# Patient Record
Sex: Male | Born: 1935 | Race: White | Hispanic: No | Smoking: Never smoker
Health system: Southern US, Community
[De-identification: ages and names within clinical notes are randomized; demographics above are authoritative.]

## PROBLEM LIST (undated history)

## (undated) DIAGNOSIS — C4490 Unspecified malignant neoplasm of skin, unspecified: Secondary | ICD-10-CM

## (undated) DIAGNOSIS — R197 Diarrhea, unspecified: Secondary | ICD-10-CM

## (undated) DIAGNOSIS — C189 Malignant neoplasm of colon, unspecified: Secondary | ICD-10-CM

## (undated) DIAGNOSIS — N400 Enlarged prostate without lower urinary tract symptoms: Secondary | ICD-10-CM

## (undated) DIAGNOSIS — I714 Abdominal aortic aneurysm, without rupture, unspecified: Secondary | ICD-10-CM

## (undated) DIAGNOSIS — B351 Tinea unguium: Secondary | ICD-10-CM

## (undated) DIAGNOSIS — K5790 Diverticulosis of intestine, part unspecified, without perforation or abscess without bleeding: Secondary | ICD-10-CM

## (undated) DIAGNOSIS — K4090 Unilateral inguinal hernia, without obstruction or gangrene, not specified as recurrent: Secondary | ICD-10-CM

## (undated) DIAGNOSIS — K859 Acute pancreatitis without necrosis or infection, unspecified: Secondary | ICD-10-CM

## (undated) DIAGNOSIS — E079 Disorder of thyroid, unspecified: Secondary | ICD-10-CM

## (undated) DIAGNOSIS — E039 Hypothyroidism, unspecified: Secondary | ICD-10-CM

## (undated) DIAGNOSIS — K831 Obstruction of bile duct: Secondary | ICD-10-CM

## (undated) DIAGNOSIS — E876 Hypokalemia: Secondary | ICD-10-CM

## (undated) DIAGNOSIS — M353 Polymyalgia rheumatica: Secondary | ICD-10-CM

## (undated) DIAGNOSIS — M316 Other giant cell arteritis: Secondary | ICD-10-CM

## (undated) DIAGNOSIS — I10 Essential (primary) hypertension: Secondary | ICD-10-CM

## (undated) DIAGNOSIS — M810 Age-related osteoporosis without current pathological fracture: Secondary | ICD-10-CM

## (undated) HISTORY — PX: CHOLECYSTECTOMY: SHX55

## (undated) HISTORY — DX: Unspecified malignant neoplasm of skin, unspecified: C44.90

## (undated) HISTORY — PX: EYE SURGERY: SHX253

---

## 1999-05-29 DIAGNOSIS — C189 Malignant neoplasm of colon, unspecified: Secondary | ICD-10-CM

## 1999-05-29 HISTORY — PX: COLON SURGERY: SHX602

## 1999-05-29 HISTORY — DX: Malignant neoplasm of colon, unspecified: C18.9

## 2000-01-18 ENCOUNTER — Other Ambulatory Visit: Admission: RE | Admit: 2000-01-18 | Discharge: 2000-01-18 | Payer: Self-pay | Admitting: Gastroenterology

## 2000-01-18 ENCOUNTER — Encounter (INDEPENDENT_AMBULATORY_CARE_PROVIDER_SITE_OTHER): Payer: Self-pay | Admitting: Specialist

## 2000-01-23 ENCOUNTER — Encounter: Payer: Self-pay | Admitting: Gastroenterology

## 2000-01-23 ENCOUNTER — Ambulatory Visit (HOSPITAL_COMMUNITY): Admission: RE | Admit: 2000-01-23 | Discharge: 2000-01-23 | Payer: Self-pay | Admitting: Gastroenterology

## 2000-02-09 ENCOUNTER — Inpatient Hospital Stay (HOSPITAL_COMMUNITY): Admission: RE | Admit: 2000-02-09 | Discharge: 2000-02-16 | Payer: Self-pay

## 2000-02-09 ENCOUNTER — Encounter (INDEPENDENT_AMBULATORY_CARE_PROVIDER_SITE_OTHER): Payer: Self-pay

## 2002-03-11 ENCOUNTER — Ambulatory Visit (HOSPITAL_COMMUNITY): Admission: RE | Admit: 2002-03-11 | Discharge: 2002-03-11 | Payer: Self-pay | Admitting: Family Medicine

## 2002-03-11 ENCOUNTER — Encounter: Payer: Self-pay | Admitting: Family Medicine

## 2003-03-19 ENCOUNTER — Ambulatory Visit (HOSPITAL_COMMUNITY): Admission: RE | Admit: 2003-03-19 | Discharge: 2003-03-19 | Payer: Self-pay | Admitting: Family Medicine

## 2003-03-19 ENCOUNTER — Encounter: Payer: Self-pay | Admitting: Family Medicine

## 2004-04-18 ENCOUNTER — Ambulatory Visit: Payer: Self-pay | Admitting: Gastroenterology

## 2004-04-28 ENCOUNTER — Ambulatory Visit: Payer: Self-pay | Admitting: Gastroenterology

## 2008-08-08 ENCOUNTER — Emergency Department (HOSPITAL_COMMUNITY): Admission: EM | Admit: 2008-08-08 | Discharge: 2008-08-08 | Payer: Self-pay | Admitting: Family Medicine

## 2008-10-22 ENCOUNTER — Encounter: Payer: Self-pay | Admitting: Cardiology

## 2008-10-27 ENCOUNTER — Ambulatory Visit: Payer: Self-pay | Admitting: Cardiology

## 2008-10-28 ENCOUNTER — Encounter: Payer: Self-pay | Admitting: Cardiology

## 2010-09-07 LAB — POCT I-STAT, CHEM 8
BUN: 11 mg/dL (ref 6–23)
Calcium, Ion: 1.11 mmol/L — ABNORMAL LOW (ref 1.12–1.32)
Chloride: 103 mEq/L (ref 96–112)
Creatinine, Ser: 1.2 mg/dL (ref 0.4–1.5)
Glucose, Bld: 101 mg/dL — ABNORMAL HIGH (ref 70–99)
HCT: 48 % (ref 39.0–52.0)
Hemoglobin: 16.3 g/dL (ref 13.0–17.0)
Potassium: 3.7 mEq/L (ref 3.5–5.1)
Sodium: 140 mEq/L (ref 135–145)
TCO2: 29 mmol/L (ref 0–100)

## 2010-10-10 NOTE — Assessment & Plan Note (Signed)
Corey Walker                            CARDIOLOGY OFFICE NOTE   Corey Walker, Corey Walker                       MRN:          [id number]  DATE:10/27/2008                            DOB:          01/17/1918    PRIMARY CARE PHYSICIAN:  Helene Kelp, PA   REASON FOR PRESENTATION:  Evaluate the patient with palpitations and  fatigue.   HISTORY OF PRESENT ILLNESS:  The patient is a pleasant 77 year old  gentleman without a prior cardiac history.  He was recently describing  some irregular heartbeat.  He describes that occasionally his heart will  speed up.  He is not doing anything when this happens.  It only lasts  for a few seconds per his report.  He denies any syncope.  He may  occasionally get lightheaded, but this is not necessarily associated  with the palpitations.  He has had no chest discomfort, neck or arm  discomfort.  He has no shortness of breath, PND, or orthopnea.  His  biggest complaint has actually been fatigue.  His brother reports that  he has never recovered following a cataract surgery.  He apparently has  fatigue throughout the day, though he sleeps well at night.  He has also  had some weight loss, though he says his appetite is reasonable.  He has  had an extensive laboratory workup including a normal testosterone  level, normal thyroid studies, and normal CBC.  No etiology for his  fatigue has been identified.   He has had some fluctuating blood pressures.  He has had multiple  changes to his blood pressure medication, but seems to be on a stable  regimen of two drugs with well-controlled blood pressures.  He has some  spiking blood pressures associated with anxiety, and it was thought  possibly this could be related to panic.   PAST MEDICAL HISTORY:  Hypertension less than one year, temporal  arteritis/polymyalgia rheumatica, cataracts, and colon cancer.   ALLERGIES:  PENICILLIN and FISH OIL.   MEDICATIONS:  1. Lisinopril 10  mg daily.  2. Amlodipine 5 mg daily.  3. Proscar 5 mg daily.  4. Calcium.  5. Flomax.  6. Prednisone 5 mg daily.  7. Advair 100/50 b.i.d.  8. Vitamin D.   SOCIAL HISTORY:  The patient is retired.  He is a widow.  He has one  child.  He does not smoke cigarettes or drink alcohol.   FAMILY HISTORY:  Noncontributory for early coronary artery disease.   REVIEW OF SYSTEMS:  As stated in the HPI, and positive for occasional  diarrhea.  Negative for all other systems.   PHYSICAL EXAMINATION:  GENERAL:  The patient is pleasant, and in no  distress.  VITAL SIGNS:  Blood pressure 128/74, heart rate 80 and regular, weight  131 pounds, and body mass index 21.  HEENT:  Eyes:  Unremarkable.  Pupils equal, round, and reactive to  light.  Fundi not visualized.  Oral mucosa unremarkable.  NECK:  No jugular venous distention to 45 degrees.  Carotid upstroke  brisk and symmetrical.  No bruits.  No thyromegaly.  LYMPHATICS:  No cervical, axillary, or inguinal adenopathy.  LUNGS:  Clear to auscultation bilaterally.  BACK:  No costovertebral angle tenderness.  CHEST:  Unremarkable.  HEART:  PMI not displaced or sustained.  S1 and S2 within normal limits.  No S3, no S4, no clicks, no rubs, no murmurs.  ABDOMEN:  Flat.  Positive bowel sounds.  Normal in frequency, pitch.  No  bruits.  No rebound.  No guarding.  No midline pulsatile mass.  No  hepatomegaly or splenomegaly.  SKIN:  No rashes and no nodules.  EXTREMITIES:  A 2+ pulses throughout.  No edema, no cyanosis, and no  clubbing.  NEURO:  Oriented to person, place, and time.  Cranial nerves II through  XII grossly intact.  Motor grossly intact.   EKG; sinus rhythm, rate 79, axis within normal limits, and incomplete  right bundle-branch block.   ASSESSMENT AND PLAN:  1. Palpitations.  This patient has some mild palpitations.  However,      these are short lived.  At this point, I do not think any further      cardiovascular testing of these  would be suggested as they are not      particularly symptomatic.  He has had TSH and electrolytes.      Therefore, I do not suggest further testing.  He will let me know      if these get worse or associated with presyncope or syncope in the      future.  2. Fatigue.  This is clearly the patient's most significant complaint.      He has had a very extensive laboratory workup.  The one thing that      I do not see is a cortisol level.  I will have him come back for an      a.m. cortisol level as adrenal insufficiency given his longstanding      steroid use is a possibility.  3. Hypertension.  His blood pressure has been labile, but seems to be      well controlled.  He is tolerating the medicines as listed.  I will      continue with these.  4. Followup.  I will see the patient again as needed.     Rollene Rotunda, MD, [facility]  Electronically Signed    JH/MedQ  DD: 10/27/2008  DT: 10/28/2008  Job #: [id number]   cc:   Helene Kelp, PA

## 2010-10-13 NOTE — Discharge Summary (Signed)
[place]. [facility]  Patient:    Corey Walker, Corey Walker                       MRN: [id number] Adm. Date:  [id number] Disc. Date: [id number] Attending:  Gennie Alma CC:         Dr. Melvia Heaps, [place], [place]  Dr. Rudi Heap, [place], [place]   Discharge Summary  FINAL DIAGNOSES: 1. 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 2. With a prostatism history, this 77 year old male patient underwent a full    physical examination recently and was found to have Hemoccult-positive    stools.  This prompted a colonoscopy which revealed a mass at the ascending    colon approximately 3 cm in diameter as well as some sigmoid diverticula    and a small sessile polyp in the rectal vault which was resected and was    a tubulovillous adenoma.  The mass of the right colon was biopsied also and    did not show any signs of malignancy.  Despite his age of 77 years, he was    in excellent health except for prostatism.  Physical examination was    completely normal.  HISTORY OF PRESENT ILLNESS:  Patient underwent a bowel prep at home on the day prior to admission and then on the day of admission, February 09, 2000, he underwent a right colectomy.  He did have a significant villous tumor of the ascending colon but only a very small portion of it had a focus of adenocarcinoma with superficial invasion of the wall of the colon, a T1 lesion.  Patient tolerated the procedure well and had an uneventful postoperative course being discharged on seventh postoperative day at which time he was afebrile, ambulating well, eating, his bowels were working, his incision was healing, his skin staples were removed and Steri-Strips applied.  CONDITION ON DISCHARGE:  Satisfactory.  DIET:  Ad lib.  ACTIVITY:  Limited to no strenuous activity.  MEDICATIONS: 1. Vicodin one every six hours p.r.n. for pain. 2. Phenergan 25 mg every six  hours as needed for nausea.  FOLLOW-UP:  He was to return to our office on February 21, 2000 for further follow up. DD:  03/01/00 TD:  03/02/00 Job: [id number]

## 2010-10-13 NOTE — Op Note (Signed)
[place]. [facility]  Patient:    Corey Walker, Corey Walker                       MRN: [id number] Proc. Date: 02/09/00 Adm. Date:  [id number] Attending:  Gennie Alma                           Operative Report  [facility] #:  [phone]  PREOPERATIVE DIAGNOSIS:  Villous tumor of ascending colon.  POSTOPERATIVE DIAGNOSIS:  Villous tumor of ascending colon.  OPERATION:  Right colectomy.  SURGEON:  Dr. Milus Mallick.  ASSISTANT:  Dr. Maisie Fus B. Price.  ANESTHESIA:  General endotracheal anesthesia.  DESCRIPTION OF PROCEDURE:  Under adequate general endotracheal anesthesia, the patients abdomen was prepared and draped in the usual fashion.  A 16 French Foley catheter was inserted into the urinary bladder under sterile conditions. A midline incision was made from the mid epigastrium to the mid hypogastrium, going around the left side of the umbilicus; bleeders were electrocoagulated. Upon entering the peritoneal cavity, there were no abnormalities noted in the peritoneal lining.  The only finding was a significantly large intraluminal tumor of the ascending colon that had the size of a golf ball.  The small bowel was completely normal, as was the stomach and duodenum.  The liver was normal to palpation.  A Balfour self-retaining retractor was inserted.  The right colon was approached.  The peritoneal reflection in the right colon was incised with Bovie electrocoagulation and the right colon was reflected medially and rotated anteriorly and dissected up off of the retroperitoneal structures. The hepatic flexure attachments were divided over large ligaclips and the gastrocolic omentum was also divided over ligaclips.  This freed up the right colon completely.  There were some adhesions of the terminal ileum binding it down in the right lower quadrant and these also were divided, freeing up the entire colon.  The omentum at the mid portion of the  transverse colon was divided over Kelly clamps and suture ligated with 2-0 silk.  Next, the right colon was resected in this manner.  The transverse mesocolon was serially divided over Kelly clamps which were ligated with 2-0 silk, starting from the middle colic vessels and going proximally.  The ileocolic artery was triply clipped with Kelly clamps and divided between the distal two clamps, ligated with 2-0 silk and suture ligated with 2-0 silk.  The mesentery of the terminal ileum was divided over Kelly clamps which were ligated with 2-0 silk, and next, the terminal ileum was divided with the GIA 75 mm stapler approximately 10 cm proximal to the ileocecal valve.  The transverse colon was then divided at the mid portion using the GIA stapler, as well.  A side-to-side, functional end-to-end anastomosis was carried out with the GIA 75 mm at Ethicon stapler. The entire staple line was without any bleeding.  The antimesenteric ends of the previous staple lines were cut off in order to insert the stapler, and then the common defect was closed with a single firing of the TA60 Ethicon linear stapler.  This gave a very patent anastomosis that was quite secure.  A crotch stitch was placed with 3-0 silk.  All of the instruments used for the anastomosis were then removed from the operative field, as well as the operative personnel changing gloves.  The defect in the mesentery was closed with interrupted sutures of 3-0 silk. Hemostasis was  ascertained.  The midline fascia was closed with continuous suture of #1 Prolene and the skin was approximated with a generic skin stapler 35-W.  Sterile dressing was applied.  Estimated blood loss for the procedure was negligible.  The patient tolerated the procedure well, left the operating room in satisfactory condition. DD:  02/09/00 TD:  02/10/00 Job: [id number]

## 2011-07-12 ENCOUNTER — Inpatient Hospital Stay (HOSPITAL_COMMUNITY)
Admission: AD | Admit: 2011-07-12 | Discharge: 2011-07-15 | DRG: 440 | Disposition: A | Discharge status: Home / Self Care | Payer: Medicare Other | Source: Other Acute Inpatient Hospital | Attending: Internal Medicine | Admitting: Internal Medicine

## 2011-07-12 ENCOUNTER — Encounter (HOSPITAL_COMMUNITY): Payer: Self-pay | Admitting: Internal Medicine

## 2011-07-12 DIAGNOSIS — K831 Obstruction of bile duct: Secondary | ICD-10-CM | POA: Diagnosis present

## 2011-07-12 DIAGNOSIS — K838 Other specified diseases of biliary tract: Secondary | ICD-10-CM | POA: Diagnosis present

## 2011-07-12 DIAGNOSIS — Z85038 Personal history of other malignant neoplasm of large intestine: Secondary | ICD-10-CM | POA: Diagnosis present

## 2011-07-12 DIAGNOSIS — E039 Hypothyroidism, unspecified: Secondary | ICD-10-CM | POA: Diagnosis present

## 2011-07-12 DIAGNOSIS — K859 Acute pancreatitis without necrosis or infection, unspecified: Principal | ICD-10-CM | POA: Diagnosis present

## 2011-07-12 DIAGNOSIS — I714 Abdominal aortic aneurysm, without rupture, unspecified: Secondary | ICD-10-CM | POA: Diagnosis present

## 2011-07-12 DIAGNOSIS — K4090 Unilateral inguinal hernia, without obstruction or gangrene, not specified as recurrent: Secondary | ICD-10-CM | POA: Diagnosis present

## 2011-07-12 DIAGNOSIS — M353 Polymyalgia rheumatica: Secondary | ICD-10-CM | POA: Diagnosis present

## 2011-07-12 DIAGNOSIS — I10 Essential (primary) hypertension: Secondary | ICD-10-CM | POA: Diagnosis present

## 2011-07-12 HISTORY — DX: Polymyalgia rheumatica: M35.3

## 2011-07-12 HISTORY — DX: Malignant neoplasm of colon, unspecified: C18.9

## 2011-07-12 HISTORY — DX: Essential (primary) hypertension: I10

## 2011-07-12 LAB — BASIC METABOLIC PANEL
BUN: 20 mg/dL (ref 6–23)
Calcium: 8.8 mg/dL (ref 8.4–10.5)
Creatinine, Ser: 0.87 mg/dL (ref 0.50–1.35)
GFR calc Af Amer: 85 mL/min — ABNORMAL LOW (ref >90)
GFR calc non Af Amer: 73 mL/min — ABNORMAL LOW (ref >90)
Glucose, Bld: 98 mg/dL (ref 70–99)

## 2011-07-12 LAB — HEPATIC FUNCTION PANEL
Bilirubin, Direct: 1.6 mg/dL — ABNORMAL HIGH (ref 0.0–0.3)
Indirect Bilirubin: 1.4 mg/dL — ABNORMAL HIGH (ref 0.3–0.9)
Total Bilirubin: 3 mg/dL — ABNORMAL HIGH (ref 0.3–1.2)

## 2011-07-12 LAB — CBC
MCH: 32.6 pg (ref 26.0–34.0)
MCHC: 33.8 g/dL (ref 30.0–36.0)
Platelets: 113 10*3/uL — ABNORMAL LOW (ref 150–400)
RBC: 4.33 MIL/uL (ref 4.22–5.81)
RDW: 15 % (ref 11.5–15.5)

## 2011-07-12 LAB — PROTIME-INR: Prothrombin Time: 13.5 seconds (ref 11.6–15.2)

## 2011-07-12 LAB — GLUCOSE, CAPILLARY: Glucose-Capillary: 94 mg/dL (ref 70–99)

## 2011-07-12 MED ORDER — PANTOPRAZOLE SODIUM 40 MG IV SOLR
40.0000 mg | Freq: Two times a day (BID) | INTRAVENOUS | Status: DC
  Administered 2011-07-12 – 2011-07-15 (×6): 40 mg via INTRAVENOUS
  Filled 2011-07-12 (×7): qty 40

## 2011-07-12 MED ORDER — SODIUM CHLORIDE 0.9 % IV SOLN
INTRAVENOUS | Status: DC
  Administered 2011-07-12: 22:00:00 via INTRAVENOUS

## 2011-07-12 MED ORDER — ONDANSETRON HCL 4 MG PO TABS: 4.0000 mg | ORAL_TABLET | Freq: Four times a day (QID) | ORAL | Status: DC | PRN

## 2011-07-12 MED ORDER — METHYLPREDNISOLONE SODIUM SUCC 40 MG IJ SOLR
20.0000 mg | Freq: Every day | INTRAMUSCULAR | Status: DC
  Administered 2011-07-12 – 2011-07-14 (×3): 20 mg via INTRAVENOUS
  Filled 2011-07-12 (×3): qty 0.5

## 2011-07-12 MED ORDER — IOHEXOL 300 MG/ML  SOLN
20.0000 mL | INTRAMUSCULAR | Status: AC
  Administered 2011-07-12 (×2): 20 mL via ORAL

## 2011-07-12 MED ORDER — ONDANSETRON HCL 4 MG/2ML IJ SOLN: 4.0000 mg | Freq: Four times a day (QID) | INTRAMUSCULAR | Status: DC | PRN

## 2011-07-12 MED ORDER — MORPHINE SULFATE 2 MG/ML IJ SOLN: 0.5000 mg | INTRAMUSCULAR | Status: DC | PRN

## 2011-07-12 NOTE — H&P (Signed)
Corey Walker is an 78 y.o. male.   PCP - Helene Kelp PA/ Dr.Moore. Chief Complaint: Transferred from [facility] for further workup on obstructive jaundice. HPI: 78 year-old male with history of hypertension polymyalgia rheumatica history of colon cancer status  post resection and left inguinal hernia had gone to [facility] because of abdominal pain. Patient had initially chest pain after which each he started experiencing abdominal pain and [facility] was found to have pancreatitis with obstructive jaundice. His total bilirubin on admission was found to be around 5.4 and repeat done today was found to be around 6.3 direct bilirubin was 4.2 sonogram of the abdomen today showed a dilated CBD along with abdominal aortic aneurysm patient is post cholecystectomy. Patient was transferred to [facility] for further management as there was no gastroenterologist available at that facility. Patient states still he has mild abdominal discomfort which increases on eating. Patient denies any chest pain at this time, denies any cough, shortness of breath nausea vomiting or diarrhea. Denies any fever chills. Presently he is not in any acute distress. His cardiac enzymes at [facility] was negative. His LFTs done on July 11, 2011 where showing total bilirubin 6.3 direct bilirubin 4.2 amylase 213 lipase 69 heart phosphatase 109 AST 172 ALT 213. His PT/INR was 12.7 and 0.9 UA was negative for nitrites and leukocyte Estrace. Basic metabolic panel showed glucose of 177 BUN of 25 creatinine of 0.8 calcium 8.7 sodium 138 potassium 3.7 chloride 105 products a 22 his cardiac enzymes total CPK was 29 CPK MB 3.4 relative index 11.7 troponin I 0.03. CBC showed a WBC of 10.2 hemoglobin 14.5 hematocrit 42.8 platelets 119. Sonogram of the abdomen done today was read as post cholecystectomy with dilated CBD 11 mm diameter with associated intrahepatic biliary dilatation large left renal cyst abdominal  aortic aneurysm measures 4.1 into 3.6 cm in axial dimensions and extending at least 5.9 cm in length inadequate visualization of pancreatic head and distal CBD.  Past Medical History  Diagnosis Date  . Hypertension   . Polymyalgia rheumatica   . Colon cancer     Past Surgical History  Procedure Date  . Colon surgery     History reviewed. No pertinent family history. Social History:  reports that he has never smoked. He does not have any smokeless tobacco history on file. He reports that he does not drink alcohol or use illicit drugs.  Allergies:  Allergies  Allergen Reactions  . Penicillins Swelling    Swelling of hands (per [facility] records)    No current facility-administered medications on file as of 07/12/2011.   No current outpatient prescriptions on file as of 07/12/2011.    No results found for this or any previous visit (from the past 48 hour(s)). No results found.  Review of Systems  Constitutional: Negative.   HENT: Negative.   Eyes: Negative.   Respiratory: Negative.   Cardiovascular: Negative.   Gastrointestinal: Positive for abdominal pain.  Genitourinary: Negative.   Musculoskeletal: Negative.   Skin: Negative.   Neurological: Negative.   Endo/Heme/Allergies: Negative.   Psychiatric/Behavioral: Negative.     Blood pressure 119/73, pulse 79, temperature 98.1 F (36.7 C), temperature source Oral, resp. rate 16, SpO2 100.00%. Physical Exam  Constitutional: He is oriented to person, place, and time. He appears well-developed and well-nourished. No distress.  HENT:  Head: Normocephalic and atraumatic.  Right Ear: External ear normal.  Left Ear: External ear normal.  Nose: Nose normal.  Mouth/Throat: Oropharynx is  clear and moist. No oropharyngeal exudate.  Eyes: Scleral icterus is present.  Neck: Normal range of motion. Neck supple.  Cardiovascular: Normal rate, regular rhythm and normal heart sounds.   Respiratory: Effort normal and breath sounds  normal. No respiratory distress. He has no wheezes. He has no rales.  GI: Soft. Bowel sounds are normal. He exhibits no distension. There is tenderness. There is no rebound and no guarding.       Diffuse tenderness but more in the right upper and lower quadrant. Left inguinal hernia.  Musculoskeletal: Normal range of motion. He exhibits no edema and no tenderness.  Neurological: He is alert and oriented to person, place, and time.       Moves upper and lower extremities.  Skin: Skin is warm and dry. No rash noted. He is not diaphoretic. No erythema.  Psychiatric: His behavior is normal.     Assessment/Plan #1. Acute pancreatitis with obstructive jaundice - at this time I have discussed with Dr. Jeani Hawking gastroenterologist on call. We will be ordering a CAT scan abdomen and pelvis with contrast to rule out any pancreatic mass. Patient will be kept n.p.o. for possible ERCP. Recheck his LFTs and basic labs. #2. History of hypertension - as patient is n.p.o. patient will be kept on IV labetalol when necessary for systolic blood pressure more than 160. #3. History of polymyalgia rheumatica on steroids - we'll keep patient on small dose of IV Solu-Medrol until patient can take by mouth. #4. Abdominal aortic aneurysm - we'll check CT abdomen and also will need follow as outpatient if CT abdomen does not show anything acute. #5. History of CAcolon status post resection. #6. Left inguinal hernia not obstructed.  CODE STATUS - full code.  Eduard Clos 07/12/2011, 8:32 PM

## 2011-07-13 ENCOUNTER — Inpatient Hospital Stay (HOSPITAL_COMMUNITY): Payer: Medicare Other

## 2011-07-13 ENCOUNTER — Encounter (HOSPITAL_COMMUNITY)
Admission: AD | Disposition: A | Discharge status: Home / Self Care | Payer: Self-pay | Source: Other Acute Inpatient Hospital | Attending: Internal Medicine

## 2011-07-13 ENCOUNTER — Encounter (HOSPITAL_COMMUNITY): Payer: Self-pay

## 2011-07-13 HISTORY — PX: EUS: SHX5427

## 2011-07-13 LAB — CBC
MCH: 31.7 pg (ref 26.0–34.0)
MCV: 97.6 fL (ref 78.0–100.0)
Platelets: 96 10*3/uL — ABNORMAL LOW (ref 150–400)
RBC: 4.13 MIL/uL — ABNORMAL LOW (ref 4.22–5.81)
RDW: 15 % (ref 11.5–15.5)
WBC: 8.2 10*3/uL (ref 4.0–10.5)

## 2011-07-13 LAB — BASIC METABOLIC PANEL
BUN: 22 mg/dL (ref 6–23)
CO2: 21 mEq/L (ref 19–32)
Calcium: 8.4 mg/dL (ref 8.4–10.5)
GFR calc non Af Amer: 77 mL/min — ABNORMAL LOW (ref >90)
Glucose, Bld: 94 mg/dL (ref 70–99)

## 2011-07-13 LAB — TSH: TSH: 0.495 u[IU]/mL (ref 0.350–4.500)

## 2011-07-13 LAB — HEPATIC FUNCTION PANEL
AST: 83 U/L — ABNORMAL HIGH (ref 0–37)
Albumin: 2.1 g/dL — ABNORMAL LOW (ref 3.5–5.2)
Alkaline Phosphatase: 124 U/L — ABNORMAL HIGH (ref 39–117)
Bilirubin, Direct: 0.8 mg/dL — ABNORMAL HIGH (ref 0.0–0.3)
Total Bilirubin: 1.7 mg/dL — ABNORMAL HIGH (ref 0.3–1.2)

## 2011-07-13 LAB — CARDIAC PANEL(CRET KIN+CKTOT+MB+TROPI)
CK, MB: 3.2 ng/mL (ref 0.3–4.0)
Total CK: 43 U/L (ref 7–232)

## 2011-07-13 LAB — GLUCOSE, CAPILLARY
Glucose-Capillary: 81 mg/dL (ref 70–99)
Glucose-Capillary: 112 mg/dL — ABNORMAL HIGH (ref 70–99)
Glucose-Capillary: 132 mg/dL — ABNORMAL HIGH (ref 70–99)

## 2011-07-13 SURGERY — UPPER ENDOSCOPIC ULTRASOUND (EUS) LINEAR
Anesthesia: Moderate Sedation

## 2011-07-13 MED ORDER — MIDAZOLAM HCL 10 MG/2ML IJ SOLN
INTRAMUSCULAR | Status: AC
  Filled 2011-07-13: qty 4

## 2011-07-13 MED ORDER — FUROSEMIDE 20 MG PO TABS
20.0000 mg | ORAL_TABLET | Freq: Every day | ORAL | Status: DC
  Administered 2011-07-13 – 2011-07-15 (×3): 20 mg via ORAL
  Filled 2011-07-13 (×3): qty 1

## 2011-07-13 MED ORDER — FENTANYL CITRATE 0.05 MG/ML IJ SOLN
INTRAMUSCULAR | Status: DC | PRN
  Administered 2011-07-13: 12.5 ug via INTRAVENOUS
  Administered 2011-07-13: 25 ug via INTRAVENOUS
  Administered 2011-07-13 (×3): 12.5 ug via INTRAVENOUS

## 2011-07-13 MED ORDER — FLUTICASONE PROPIONATE HFA 44 MCG/ACT IN AERO
2.0000 | INHALATION_SPRAY | Freq: Two times a day (BID) | RESPIRATORY_TRACT | Status: DC
  Administered 2011-07-13 – 2011-07-15 (×4): 2 via RESPIRATORY_TRACT
  Filled 2011-07-13: qty 10.6

## 2011-07-13 MED ORDER — DEXTROSE-NACL 5-0.9 % IV SOLN
INTRAVENOUS | Status: DC
  Administered 2011-07-13 – 2011-07-14 (×2): via INTRAVENOUS

## 2011-07-13 MED ORDER — CHOLECALCIFEROL 10 MCG (400 UNIT) PO TABS
400.0000 [IU] | ORAL_TABLET | Freq: Two times a day (BID) | ORAL | Status: DC
  Administered 2011-07-13 – 2011-07-15 (×4): 400 [IU] via ORAL
  Filled 2011-07-13 (×5): qty 1

## 2011-07-13 MED ORDER — IOHEXOL 300 MG/ML  SOLN
100.0000 mL | Freq: Once | INTRAMUSCULAR | Status: AC | PRN
  Administered 2011-07-13: 100 mL via INTRAVENOUS

## 2011-07-13 MED ORDER — PREDNISONE 5 MG PO TABS
5.0000 mg | ORAL_TABLET | Freq: Two times a day (BID) | ORAL | Status: DC
  Administered 2011-07-13 – 2011-07-15 (×4): 5 mg via ORAL
  Filled 2011-07-13 (×6): qty 1

## 2011-07-13 MED ORDER — HYDRALAZINE HCL 20 MG/ML IJ SOLN
10.0000 mg | Freq: Four times a day (QID) | INTRAMUSCULAR | Status: DC | PRN
  Filled 2011-07-13: qty 0.5

## 2011-07-13 MED ORDER — NALOXONE HCL 0.4 MG/ML IJ SOLN
INTRAMUSCULAR | Status: AC
  Filled 2011-07-13: qty 1

## 2011-07-13 MED ORDER — CYANOCOBALAMIN 1000 MCG/ML IJ SOLN
1000.0000 ug | INTRAMUSCULAR | Status: DC
  Administered 2011-07-14: 1000 ug via INTRAMUSCULAR
  Filled 2011-07-13 (×2): qty 1

## 2011-07-13 MED ORDER — SODIUM CHLORIDE 0.9 % IV SOLN: Freq: Once | INTRAVENOUS | Status: DC

## 2011-07-13 MED ORDER — MIDAZOLAM HCL 10 MG/2ML IJ SOLN
INTRAMUSCULAR | Status: DC | PRN
  Administered 2011-07-13: 2 mg via INTRAVENOUS
  Administered 2011-07-13: 1.5 mg via INTRAVENOUS
  Administered 2011-07-13 (×2): 1 mg via INTRAVENOUS
  Administered 2011-07-13: 2 mg via INTRAVENOUS

## 2011-07-13 MED ORDER — FERROUS FUMARATE 325 (106 FE) MG PO TABS
1.0000 | ORAL_TABLET | Freq: Every day | ORAL | Status: DC
  Administered 2011-07-13 – 2011-07-15 (×3): 106 mg via ORAL
  Filled 2011-07-13 (×3): qty 1

## 2011-07-13 MED ORDER — FENTANYL CITRATE 0.05 MG/ML IJ SOLN
INTRAMUSCULAR | Status: AC
  Filled 2011-07-13: qty 4

## 2011-07-13 MED ORDER — FLUMAZENIL 0.5 MG/5ML IV SOLN
INTRAVENOUS | Status: AC
  Filled 2011-07-13: qty 5

## 2011-07-13 NOTE — Progress Notes (Signed)
Assessed pts steward scale = 3.  Recovery RN, W. Herndon-Myers informed that pt not responsive to verbal stimulation.  VSS except HR was SB - 47-52.  Pt not cyanotic.  CBG = 115.  Performed light sternal rub w/o response.  1622 reversed pt with Romazicon 0.3mg  IV x1 and Narcan 0.2mg  IV x1.  1626 Pt still not responsive to verbal stimulation.  1627 Pt given Romazicon 0.2mg  IV x1 and Narcan 0.2mg  IV x1.  1637 Pt begins to respond to verbal stimuli, steward sedation scale = 5.  Bradly Chris, RN continues with recovery of pt.

## 2011-07-13 NOTE — Interval H&P Note (Signed)
History and Physical Interval Note:  07/13/2011 2:23 PM  Corey Walker  has presented today for surgery, with the diagnosis of Pancreatitis and abnormal liver enzymes  The various methods of treatment have been discussed with the patient and family. After consideration of risks, benefits and other options for treatment, the patient has consented to  Procedure(s) (LRB): UPPER ENDOSCOPIC ULTRASOUND (EUS) LINEAR (N/A) as a surgical intervention .  The patients' history has been reviewed, patient examined, no change in status, stable for surgery.  I have reviewed the patients' chart and labs.  Questions were answered to the patient's satisfaction.     Mozes Sagar D

## 2011-07-13 NOTE — Progress Notes (Signed)
Utilization review completed.  

## 2011-07-13 NOTE — Progress Notes (Signed)
INITIAL ADULT NUTRITION ASSESSMENT Date: 07/13/2011   Time: 9:57 AM Reason for Assessment: thin/NPO  ASSESSMENT: Male 78 y.o.  Dx: ? Gallstone  Pancreatitis vs symptomatic cholelithiasis, large left inguinal hernia  Hx:  Past Medical History  Diagnosis Date  . Hypertension   . Polymyalgia rheumatica   . Colon cancer    Past Surgical History  Procedure Date  . Colon surgery     Related Meds: solumedrol, protonix  Ht: 5\' 7"  (170.2 cm)  Wt: 122 lb 12.7 oz (55.7 kg)  Ideal Wt: 148# (67kg) % Ideal Wt: 83  Usual Wt: 120# for the past 2 years % Usual Wt: 102  Body mass index is 19.23 kg/(m^2).  Food/Nutrition Related Hx: Regular diet.  "Eats like a Horse" but unable to gain weight per pt.  Son confirms history.  Labs:  CMP     Component Value Date/Time   NA 137 07/13/2011 0530   K 3.6 07/13/2011 0530   CL 106 07/13/2011 0530   CO2 21 07/13/2011 0530   GLUCOSE 94 07/13/2011 0530   BUN 22 07/13/2011 0530   CREATININE 0.78 07/13/2011 0530   CALCIUM 8.4 07/13/2011 0530   PROT 5.0* 07/13/2011 0530   ALBUMIN 2.1* 07/13/2011 0530   AST 83* 07/13/2011 0530   ALT 239* 07/13/2011 0530   ALKPHOS 124* 07/13/2011 0530   BILITOT 1.7* 07/13/2011 0530   GFRNONAA 77* 07/13/2011 0530   GFRAA 89* 07/13/2011 0530    I/O last 3 completed shifts: In: -  Out: 750 [Urine:750]     Diet Order: NPO  Supplements/Tube Feeding:none  IVF:    sodium chloride Last Rate: 75 mL/hr at 07/12/11 2130    Estimated Nutritional Needs:per day   Kcal: 1300-1400 Protein: 65-75g Fluid: >1.3L  Pt transferred from [facility] and has been NPO x2 days.  Pt reports no pain currently and feels good.  Awaiting tests.  At risk for malnutrition secondary to low weight and current NPO status.  NUTRITION DIAGNOSIS: -Inadequate oral intake (NI-2.1).  Status: Ongoing  RELATED TO: altered GI function  AS EVIDENCE BY: NPO status  MONITORING/EVALUATION(Goals): Tolerate diet advancement to  solids  EDUCATION NEEDS: -No education needs identified at this time  INTERVENTION: 1.  NPO diet to be advanced as appropriate by MD 2.  Monitor diet status, weight, labs.  Dietitian [phone]  DOCUMENTATION CODES Per approved criteria  -Not Applicable    Jeoffrey Massed 07/13/2011, 9:57 AM

## 2011-07-13 NOTE — H&P (View-Only) (Signed)
Subjective: Patient seen and examined this morning. Informs is abdominal pain to have resolved.  Objective:  Vital signs in last 24 hours:  Filed Vitals:   07/12/11 1909 07/12/11 2135 07/13/11 0552  BP: 119/73 113/60 149/80  Pulse: 79 61 50  Temp: 98.1 F (36.7 C) 98.1 F (36.7 C) 97.2 F (36.2 C)  TempSrc: Oral Oral Oral  Resp: 16 18 16   Height:   5\' 7"  (1.702 m)  Weight:   55.7 kg (122 lb 12.7 oz)  SpO2: 100% 99% 100%    Intake/Output from previous day:   Intake/Output Summary (Last 24 hours) at 07/13/11 1034 Last data filed at 07/13/11 0030  Gross per 24 hour  Intake      0 ml  Output    750 ml  Net   -750 ml    Physical Exam:  General: elderly in no acute distress. HEENT: no pallor, icteric , moist oral mucosa, no JVD, no lymphadenopathy Heart: Normal  s1 &s2  Regular rate and rhythm, without murmurs, rubs, gallops. Lungs: Clear to auscultation bilaterally. Abdomen: Soft, nontender, nondistended, positive bowel sounds. Left inguinal hernia+ Extremities: No clubbing cyanosis or edema with positive pedal pulses. Neuro: Alert, awake, oriented x3, nonfocal.   Lab Results:  Basic Metabolic Panel:    Component Value Date/Time   NA 137 07/13/2011 0530   K 3.6 07/13/2011 0530   CL 106 07/13/2011 0530   CO2 21 07/13/2011 0530   BUN 22 07/13/2011 0530   CREATININE 0.78 07/13/2011 0530   GLUCOSE 94 07/13/2011 0530   CALCIUM 8.4 07/13/2011 0530   CBC:    Component Value Date/Time   WBC 8.2 07/13/2011 0530   HGB 13.1 07/13/2011 0530   HCT 40.3 07/13/2011 0530   PLT 96* 07/13/2011 0530   MCV 97.6 07/13/2011 0530    No results found for this or any previous visit (from the past 240 hour(s)).  Studies/Results: Ct Abdomen Pelvis W Contrast  07/13/2011  *RADIOLOGY REPORT*  Clinical Data: Abdominal pain.  History of pancreatitis.  CT ABDOMEN AND PELVIS WITH CONTRAST  Technique:  Multidetector CT imaging of the abdomen and pelvis was performed following the standard  protocol during bolus administration of intravenous contrast.  Contrast: OMNIPAQUE IOHEXOL 300 MG/ML IV SOLN  Comparison: 01/26/2011  Findings: Dependent atelectasis in the lung bases.  Small esophageal hiatal hernia.  Surgical absence of the gallbladder.  Mild intra and extrahepatic bile duct dilatation.  This is likely physiologic postcholecystectomy.  No discrete mass or stone is visualized.  The pancreas demonstrates diffuse fatty atrophy.  No discrete pancreatic mass is visualized.  The appearance is similar to the previous study.  No inflammatory stranding or fluid collections demonstrated around the pancreas.  No focal liver lesions.  Spleen size is normal.  No adrenal gland nodules.  Large cyst in the midportion of the left kidney measuring 7.5 cm diameter, stable since previous study.  No solid mass or pyelocaliectasis demonstrated in the kidneys.  Small sub centimeter cysts within the renal parenchyma bilaterally.  The stomach and small bowel are not distended.  Contrast material flows to the sigmoid colon suggesting no evidence of obstruction.  No free air or free fluid in the abdomen.  Abdominal aortic aneurysm with peripheral calcification and mural thrombus.  The aneurysm measures about 4.2 cm diameter. Previous measurement is 3.9 cm.  This is likely within margin of error of measurement.  Pelvis:  There is a large left inguinal hernia containing bowel loops.  No  proximal obstruction.  This is stable. Diverticulosis of the sigmoid colon without inflammatory change.  Bladder wall is not thickened.  Prostate gland is moderately enlarged. No free or loculated pelvic fluid collections.  The appendix is normal. Generative changes in the lumbar spine.  No significant change since previous study.  IMPRESSION: Post cholecystectomy with likely physiologic bile duct dilatation. No pancreatic ductal dilatation or inflammatory changes demonstrated.  Diffuse pancreatic fatty atrophy.  4.2 cm diameter  abdominal aortic aneurysm appears stable.  Large left inguinal hernia containing multiple small bowel loops but without proximal obstruction.  Diverticulosis without diverticulitis in the sigmoid colon.  Original Report Authenticated By: Marlon Pel, M.D.    Medications: Scheduled Meds:   . iohexol  20 mL Oral Q1 Hr x 2  . methylPREDNISolone (SOLU-MEDROL) injection  20 mg Intravenous Daily  . pantoprazole (PROTONIX) IV  40 mg Intravenous Q12H   Continuous Infusions:   . sodium chloride 75 mL/hr at 07/12/11 2130   PRN Meds:.iohexol, morphine, ondansetron (ZOFRAN) IV, ondansetron  Assessment/Plan: 78 y/o male with hx of PMR, HTN, hx of colon ca transferred from [facility] hospital where he presented with abdominal pain and found to have transaminitis with dilated CBD on imaging. ( has hx of cholecystectomy). Patient transferred here for GI eval.    acute pancreatitis with obstructive  jaundice Unclear if this is related to gallstone. CT abdomen does not show any stone and no pancreatic duct dilatation . his pain now resolved. Possible to have passed the stone. transaminaitis slowly improving as well  seen by GI ( hung) with plan on EUS today. Follow up with repeat labs  HTN  on IV hydralazine prn as pt NPO  Hx of PMR  on chr steroids Placed on IV solumedrol as he's NPO  AAA CT abd done shows size of 4.2 , slight increase from 3.9 5 months back   Full code  LOS: 1 day   Darreld Hoffer 07/13/2011, 10:34 AM

## 2011-07-13 NOTE — Op Note (Signed)
[facility] [address] [place], [place]  [address]  ENDOSCOPIC ULTRASOUND PROCEDURE REPORT  PATIENT:  Corey Walker, Corey Walker  MR#:  [id number] BIRTHDATE:  1918/01/14  GENDER:  male ENDOSCOPIST:  Jeani Hawking, MD PROCEDURE DATE:  07/13/2011 PROCEDURE:  Upper EUS ASA CLASS:  Class II INDICATIONS:  ? Choledocholithiasis MEDICATIONS:   Fentanyl 75 mcg IV, Versed 7.5 mg IV  DESCRIPTION OF PROCEDURE:   After the risks benefits and alternatives of the procedure were  explained, informed consent was obtained. The patient was then placed in the left, lateral, decubitus postion and IV sedation was administered. Throughout the procedure, the patient's blood pressure, pulse and oxygen saturations were monitored continuously.  Under direct visualization, the  endoscope was introduced through the mouth and advanced to the first portion of the duodenum.  Water was used as necessary to provide an acoustic interface.  Upon completion of the imaging, water was removed and the patient was sent to the recovery room in satisfactory condition. <<PROCEDUREIMAGES>>  FINDINGS:   The procedure was difficult to perform as he was combative and obtaining an accurate Pox reading was difficult. The saturation values ranged from 82% to 100% during the procedure. A clear view of the entire CBD was obtained. The duct was noted to be mildly dilated at 9 mm, but there was no evidence of any retained stones, sludge, or masses. The pancreatic parenchyma appeared to be normal, but a complete examination was curtailed secondary to the above mentioned issues.    The scope was then withdrawn from the patient and the procedure completed.  COMPLICATIONS:  None  ENDOSCOPIC IMPRESSION: 1) Midly dilated CBD. RECOMMENDATIONS: 1) Supportive care.  No further GI intervention at this time. Call with any questions.  ______________________________ Jeani Hawking, MD  n. Rosalie DoctorJeani Hawking at 07/13/2011 03:18  PM  Corey Walker, [id number]

## 2011-07-13 NOTE — Progress Notes (Signed)
Subjective: Patient seen and examined this morning. Informs is abdominal pain to have resolved.  Objective:  Vital signs in last 24 hours:  Filed Vitals:   07/12/11 1909 07/12/11 2135 07/13/11 0552  BP: 119/73 113/60 149/80  Pulse: 79 61 50  Temp: 98.1 F (36.7 C) 98.1 F (36.7 C) 97.2 F (36.2 C)  TempSrc: Oral Oral Oral  Resp: 16 18 16  Height:   5' 7" (1.702 m)  Weight:   55.7 kg (122 lb 12.7 oz)  SpO2: 100% 99% 100%    Intake/Output from previous day:   Intake/Output Summary (Last 24 hours) at 07/13/11 1034 Last data filed at 07/13/11 0030  Gross per 24 hour  Intake      0 ml  Output    750 ml  Net   -750 ml    Physical Exam:  General: elderly in no acute distress. HEENT: no pallor, icteric , moist oral mucosa, no JVD, no lymphadenopathy Heart: Normal  s1 &s2  Regular rate and rhythm, without murmurs, rubs, gallops. Lungs: Clear to auscultation bilaterally. Abdomen: Soft, nontender, nondistended, positive bowel sounds. Left inguinal hernia+ Extremities: No clubbing cyanosis or edema with positive pedal pulses. Neuro: Alert, awake, oriented x3, nonfocal.   Lab Results:  Basic Metabolic Panel:    Component Value Date/Time   NA 137 07/13/2011 0530   K 3.6 07/13/2011 0530   CL 106 07/13/2011 0530   CO2 21 07/13/2011 0530   BUN 22 07/13/2011 0530   CREATININE 0.78 07/13/2011 0530   GLUCOSE 94 07/13/2011 0530   CALCIUM 8.4 07/13/2011 0530   CBC:    Component Value Date/Time   WBC 8.2 07/13/2011 0530   HGB 13.1 07/13/2011 0530   HCT 40.3 07/13/2011 0530   PLT 96* 07/13/2011 0530   MCV 97.6 07/13/2011 0530    No results found for this or any previous visit (from the past 240 hour(s)).  Studies/Results: Ct Abdomen Pelvis W Contrast  07/13/2011  *RADIOLOGY REPORT*  Clinical Data: Abdominal pain.  History of pancreatitis.  CT ABDOMEN AND PELVIS WITH CONTRAST  Technique:  Multidetector CT imaging of the abdomen and pelvis was performed following the standard  protocol during bolus administration of intravenous contrast.  Contrast: 100mL OMNIPAQUE IOHEXOL 300 MG/ML IV SOLN  Comparison: 01/26/2011  Findings: Dependent atelectasis in the lung bases.  Small esophageal hiatal hernia.  Surgical absence of the gallbladder.  Mild intra and extrahepatic bile duct dilatation.  This is likely physiologic postcholecystectomy.  No discrete mass or stone is visualized.  The pancreas demonstrates diffuse fatty atrophy.  No discrete pancreatic mass is visualized.  The appearance is similar to the previous study.  No inflammatory stranding or fluid collections demonstrated around the pancreas.  No focal liver lesions.  Spleen size is normal.  No adrenal gland nodules.  Large cyst in the midportion of the left kidney measuring 7.5 cm diameter, stable since previous study.  No solid mass or pyelocaliectasis demonstrated in the kidneys.  Small sub centimeter cysts within the renal parenchyma bilaterally.  The stomach and small bowel are not distended.  Contrast material flows to the sigmoid colon suggesting no evidence of obstruction.  No free air or free fluid in the abdomen.  Abdominal aortic aneurysm with peripheral calcification and mural thrombus.  The aneurysm measures about 4.2 cm diameter. Previous measurement is 3.9 cm.  This is likely within margin of error of measurement.  Pelvis:  There is a large left inguinal hernia containing bowel loops.  No   proximal obstruction.  This is stable. Diverticulosis of the sigmoid colon without inflammatory change.  Bladder wall is not thickened.  Prostate gland is moderately enlarged. No free or loculated pelvic fluid collections.  The appendix is normal. Generative changes in the lumbar spine.  No significant change since previous study.  IMPRESSION: Post cholecystectomy with likely physiologic bile duct dilatation. No pancreatic ductal dilatation or inflammatory changes demonstrated.  Diffuse pancreatic fatty atrophy.  4.2 cm diameter  abdominal aortic aneurysm appears stable.  Large left inguinal hernia containing multiple small bowel loops but without proximal obstruction.  Diverticulosis without diverticulitis in the sigmoid colon.  Original Report Authenticated By: WILLIAM R. STEVENS, M.D.    Medications: Scheduled Meds:   . iohexol  20 mL Oral Q1 Hr x 2  . methylPREDNISolone (SOLU-MEDROL) injection  20 mg Intravenous Daily  . pantoprazole (PROTONIX) IV  40 mg Intravenous Q12H   Continuous Infusions:   . sodium chloride 75 mL/hr at 07/12/11 2130   PRN Meds:.iohexol, morphine, ondansetron (ZOFRAN) IV, ondansetron  Assessment/Plan: 78 y/o male with hx of PMR, HTN, hx of colon ca transferred from [facility] where he presented with abdominal pain and found to have transaminitis with dilated CBD on imaging. ( has hx of cholecystectomy). Patient transferred here for GI eval.    acute pancreatitis with obstructive  jaundice Unclear if this is related to gallstone. CT abdomen does not show any stone and no pancreatic duct dilatation . his pain now resolved. Possible to have passed the stone. transaminaitis slowly improving as well  seen by GI ( hung) with plan on EUS today. Follow up with repeat labs  HTN  on IV hydralazine prn as pt NPO  Hx of PMR  on chr steroids Placed on IV solumedrol as he's NPO  AAA CT abd done shows size of 4.2 , slight increase from 3.9 5 months back   Full code  LOS: 1 day   Dvid Pendry 07/13/2011, 10:34 AM  

## 2011-07-13 NOTE — Consult Note (Signed)
Reason for Consult: Gallstone pancreatitis Referring Physician: [facility]  Azalia Bilis HPI: This is a 78 year old gentleman who initially presented to [facility] for chest pain, but then it progressed to abdominal pain.  Further evaluation revealed that the patient had a pancreatitis.  His liver enzymes were elevated as well as his TB, but now the transaminases and TB have decreased.  No elevation in the WBC or evidence of any fevers.  Of note, obtaining a history from the patient is difficult as he is tangential.  Today he denies having any further abdominal pain.  The patient cannot remember when or why he had a cholecystectomy in the past.  The CT scan performed is negative for any evidence retained stones, but there is mild intrahepatic biliary ductal dilation.  Past Medical History  Diagnosis Date  . Hypertension   . Polymyalgia rheumatica   . Colon cancer     Past Surgical History  Procedure Date  . Colon surgery     History reviewed. No pertinent family history.  Social History:  reports that he has never smoked. He does not have any smokeless tobacco history on file. He reports that he does not drink alcohol or use illicit drugs.  Allergies:  Allergies  Allergen Reactions  . Darvocet (Propoxyphene N-Acetaminophen)     Propoxyphene (Per MAR)  . Loratadine (Claritin) Other (See Comments)    dizziness (PER MAR)  . Penicillins Swelling    Swelling of hands (per Echart records)    Medications:  Scheduled:   . iohexol  20 mL Oral Q1 Hr x 2  . methylPREDNISolone (SOLU-MEDROL) injection  20 mg Intravenous Daily  . pantoprazole (PROTONIX) IV  40 mg Intravenous Q12H   Continuous:   . sodium chloride 75 mL/hr at 07/12/11 2130    Results for orders placed during the hospital encounter of 07/12/11 (from the past 24 hour(s))  CBC     Status: Abnormal   Collection Time   07/12/11  9:29 PM      Component Value Range   WBC 9.9  4.0 - 10.5 (K/uL)   RBC 4.33   4.22 - 5.81 (MIL/uL)   Hemoglobin 14.1  13.0 - 17.0 (g/dL)   HCT 16.1  09.6 - 04.5 (%)   MCV 96.3  78.0 - 100.0 (fL)   MCH 32.6  26.0 - 34.0 (pg)   MCHC 33.8  30.0 - 36.0 (g/dL)   RDW 40.9  81.1 - 91.4 (%)   Platelets 113 (*) 150 - 400 (K/uL)  PROTIME-INR     Status: Normal   Collection Time   07/12/11  9:29 PM      Component Value Range   Prothrombin Time 13.5  11.6 - 15.2 (seconds)   INR 1.01  0.00 - 1.49   BASIC METABOLIC PANEL     Status: Abnormal   Collection Time   07/12/11  9:29 PM      Component Value Range   Sodium 139  135 - 145 (mEq/L)   Potassium 3.7  3.5 - 5.1 (mEq/L)   Chloride 105  96 - 112 (mEq/L)   CO2 23  19 - 32 (mEq/L)   Glucose, Bld 98  70 - 99 (mg/dL)   BUN 20  6 - 23 (mg/dL)   Creatinine, Ser 7.82  0.50 - 1.35 (mg/dL)   Calcium 8.8  8.4 - 95.6 (mg/dL)   GFR calc non Af Amer 73 (*) >90 (mL/min)   GFR calc Af Amer 85 (*) >  90 (mL/min)  HEPATIC FUNCTION PANEL     Status: Abnormal   Collection Time   07/12/11  9:29 PM      Component Value Range   Total Protein 5.7 (*) 6.0 - 8.3 (g/dL)   Albumin 2.7 (*) 3.5 - 5.2 (g/dL)   AST 130 (*) 0 - 37 (U/L)   ALT 298 (*) 0 - 53 (U/L)   Alkaline Phosphatase 134 (*) 39 - 117 (U/L)   Total Bilirubin 3.0 (*) 0.3 - 1.2 (mg/dL)   Bilirubin, Direct 1.6 (*) 0.0 - 0.3 (mg/dL)   Indirect Bilirubin 1.4 (*) 0.3 - 0.9 (mg/dL)  LIPASE, BLOOD     Status: Normal   Collection Time   07/12/11  9:29 PM      Component Value Range   Lipase 35  11 - 59 (U/L)  GLUCOSE, CAPILLARY     Status: Normal   Collection Time   07/12/11 10:10 PM      Component Value Range   Glucose-Capillary 94  70 - 99 (mg/dL)  CARDIAC PANEL(CRET KIN+CKTOT+MB+TROPI)     Status: Normal   Collection Time   07/12/11 10:38 PM      Component Value Range   Total CK 43  7 - 232 (U/L)   CK, MB 3.2  0.3 - 4.0 (ng/mL)   Troponin I <0.30  <0.30 (ng/mL)   Relative Index RELATIVE INDEX IS INVALID  0.0 - 2.5   GLUCOSE, CAPILLARY     Status: Normal   Collection Time    07/13/11  3:02 AM      Component Value Range   Glucose-Capillary 84  70 - 99 (mg/dL)  BASIC METABOLIC PANEL     Status: Abnormal   Collection Time   07/13/11  5:30 AM      Component Value Range   Sodium 137  135 - 145 (mEq/L)   Potassium 3.6  3.5 - 5.1 (mEq/L)   Chloride 106  96 - 112 (mEq/L)   CO2 21  19 - 32 (mEq/L)   Glucose, Bld 94  70 - 99 (mg/dL)   BUN 22  6 - 23 (mg/dL)   Creatinine, Ser 8.65  0.50 - 1.35 (mg/dL)   Calcium 8.4  8.4 - 78.4 (mg/dL)   GFR calc non Af Amer 77 (*) >90 (mL/min)   GFR calc Af Amer 89 (*) >90 (mL/min)  HEPATIC FUNCTION PANEL     Status: Abnormal   Collection Time   07/13/11  5:30 AM      Component Value Range   Total Protein 5.0 (*) 6.0 - 8.3 (g/dL)   Albumin 2.1 (*) 3.5 - 5.2 (g/dL)   AST 83 (*) 0 - 37 (U/L)   ALT 239 (*) 0 - 53 (U/L)   Alkaline Phosphatase 124 (*) 39 - 117 (U/L)   Total Bilirubin 1.7 (*) 0.3 - 1.2 (mg/dL)   Bilirubin, Direct 0.8 (*) 0.0 - 0.3 (mg/dL)   Indirect Bilirubin 0.9  0.3 - 0.9 (mg/dL)  CBC     Status: Abnormal   Collection Time   07/13/11  5:30 AM      Component Value Range   WBC 8.2  4.0 - 10.5 (K/uL)   RBC 4.13 (*) 4.22 - 5.81 (MIL/uL)   Hemoglobin 13.1  13.0 - 17.0 (g/dL)   HCT 69.6  29.5 - 28.4 (%)   MCV 97.6  78.0 - 100.0 (fL)   MCH 31.7  26.0 - 34.0 (pg)   MCHC 32.5  30.0 - 36.0 (g/dL)  RDW 15.0  11.5 - 15.5 (%)   Platelets 96 (*) 150 - 400 (K/uL)  GLUCOSE, CAPILLARY     Status: Normal   Collection Time   07/13/11  6:36 AM      Component Value Range   Glucose-Capillary 97  70 - 99 (mg/dL)  GLUCOSE, CAPILLARY     Status: Normal   Collection Time   07/13/11  8:16 AM      Component Value Range   Glucose-Capillary 81  70 - 99 (mg/dL)     Ct Abdomen Pelvis W Contrast  07/13/2011  *RADIOLOGY REPORT*  Clinical Data: Abdominal pain.  History of pancreatitis.  CT ABDOMEN AND PELVIS WITH CONTRAST  Technique:  Multidetector CT imaging of the abdomen and pelvis was performed following the standard protocol during  bolus administration of intravenous contrast.  Contrast: OMNIPAQUE IOHEXOL 300 MG/ML IV SOLN  Comparison: 01/26/2011  Findings: Dependent atelectasis in the lung bases.  Small esophageal hiatal hernia.  Surgical absence of the gallbladder.  Mild intra and extrahepatic bile duct dilatation.  This is likely physiologic postcholecystectomy.  No discrete mass or stone is visualized.  The pancreas demonstrates diffuse fatty atrophy.  No discrete pancreatic mass is visualized.  The appearance is similar to the previous study.  No inflammatory stranding or fluid collections demonstrated around the pancreas.  No focal liver lesions.  Spleen size is normal.  No adrenal gland nodules.  Large cyst in the midportion of the left kidney measuring 7.5 cm diameter, stable since previous study.  No solid mass or pyelocaliectasis demonstrated in the kidneys.  Small sub centimeter cysts within the renal parenchyma bilaterally.  The stomach and small bowel are not distended.  Contrast material flows to the sigmoid colon suggesting no evidence of obstruction.  No free air or free fluid in the abdomen.  Abdominal aortic aneurysm with peripheral calcification and mural thrombus.  The aneurysm measures about 4.2 cm diameter. Previous measurement is 3.9 cm.  This is likely within margin of error of measurement.  Pelvis:  There is a large left inguinal hernia containing bowel loops.  No proximal obstruction.  This is stable. Diverticulosis of the sigmoid colon without inflammatory change.  Bladder wall is not thickened.  Prostate gland is moderately enlarged. No free or loculated pelvic fluid collections.  The appendix is normal. Generative changes in the lumbar spine.  No significant change since previous study.  IMPRESSION: Post cholecystectomy with likely physiologic bile duct dilatation. No pancreatic ductal dilatation or inflammatory changes demonstrated.  Diffuse pancreatic fatty atrophy.  4.2 cm diameter abdominal aortic  aneurysm appears stable.  Large left inguinal hernia containing multiple small bowel loops but without proximal obstruction.  Diverticulosis without diverticulitis in the sigmoid colon.  Original Report Authenticated By: Marlon Pel, M.D.    ROS:  As stated above in the HPI otherwise negative.  Blood pressure 149/80, pulse 50, temperature 97.2 F (36.2 C), temperature source Oral, resp. rate 16, height 5\' 7"  (1.702 m), weight 55.7 kg (122 lb 12.7 oz), SpO2 100.00%.    PE: Gen: NAD, Alert and Oriented HEENT:  [place]/AT, EOMI Neck: Supple, no LAD Lungs: CTA Bilaterally CV: RRR without M/G/R ABM: Soft, NTND, +BS, large left inginual hernia Ext: No C/C/E  Assessment/Plan: 1) ? Gallstone pancreatitis versus symptomatic cholelithiasis 2) large left inguinal hernia   The patient did not have a significant elevation in his lipase and I do not have the records from [facility] for my review.  Regardless, there is  a high probability of a gallstone pancreatitis as his ALT is elevated.  I suspect that he has passed the stone as he does not have any further pain and the TB markedly declined.  Plan: 1) EUS today.  If he has stones then an ERCP will be performed.  Deepti Gunawan D 07/13/2011, 8:23 AM

## 2011-07-13 NOTE — Progress Notes (Signed)
DR. Gonzella Lex NOTIFIED OF BS 71.  IVF CHANGED TO D5NS AT THIS TIME.

## 2011-07-14 LAB — CBC
Hemoglobin: 14.9 g/dL (ref 13.0–17.0)
RBC: 4.47 MIL/uL (ref 4.22–5.81)

## 2011-07-14 LAB — HEPATIC FUNCTION PANEL
ALT: 189 U/L — ABNORMAL HIGH (ref 0–53)
AST: 46 U/L — ABNORMAL HIGH (ref 0–37)
Albumin: 2.6 g/dL — ABNORMAL LOW (ref 3.5–5.2)
Alkaline Phosphatase: 127 U/L — ABNORMAL HIGH (ref 39–117)
Total Protein: 6.1 g/dL (ref 6.0–8.3)

## 2011-07-14 MED ORDER — LEVOTHYROXINE SODIUM 50 MCG PO TABS
50.0000 ug | ORAL_TABLET | Freq: Every day | ORAL | Status: DC
  Administered 2011-07-14 – 2011-07-15 (×2): 50 ug via ORAL
  Filled 2011-07-14 (×2): qty 1

## 2011-07-14 MED ORDER — LISINOPRIL 10 MG PO TABS
10.0000 mg | ORAL_TABLET | Freq: Every day | ORAL | Status: DC
  Administered 2011-07-14 – 2011-07-15 (×2): 10 mg via ORAL
  Filled 2011-07-14 (×2): qty 1

## 2011-07-14 NOTE — Progress Notes (Signed)
Please disregard documentation on this IV

## 2011-07-14 NOTE — Progress Notes (Signed)
Subjective: Patient seen and examined this am. Denies any symptoms today.  Objective:  Vital signs in last 24 hours:  Filed Vitals:   07/13/11 2004 07/13/11 2154 07/14/11 0602 07/14/11 0810  BP:  108/52 133/66   Pulse:  56 75   Temp:  97.9 F (36.6 C) 97.5 F (36.4 C)   TempSrc:  Oral Oral   Resp:  14 18   Height:      Weight:   54.9 kg (121 lb 0.5 oz)   SpO2: 98% 100% 100% 95%    Intake/Output from previous day:   Intake/Output Summary (Last 24 hours) at 07/14/11 1241 Last data filed at 07/14/11 1000  Gross per 24 hour  Intake    600 ml  Output    450 ml  Net    150 ml    Physical Exam:  General: elderly in no acute distress.  HEENT: no pallor, icteric , moist oral mucosa, no JVD, no lymphadenopathy  Heart: Normal s1 &s2 Regular rate and rhythm, without murmurs, rubs, gallops.  Lungs: Clear to auscultation bilaterally.  Abdomen: Soft, nontender, nondistended, positive bowel sounds. Left inguinal hernia+  Extremities: No clubbing cyanosis or edema with positive pedal pulses.  Neuro: Alert, awake, oriented x3, nonfocal.    Lab Results:  Basic Metabolic Panel:    Component Value Date/Time   NA 137 07/13/2011 0530   K 3.6 07/13/2011 0530   CL 106 07/13/2011 0530   CO2 21 07/13/2011 0530   BUN 22 07/13/2011 0530   CREATININE 0.78 07/13/2011 0530   GLUCOSE 94 07/13/2011 0530   CALCIUM 8.4 07/13/2011 0530   CBC:    Component Value Date/Time   WBC 8.2 07/13/2011 0530   HGB 13.1 07/13/2011 0530   HCT 40.3 07/13/2011 0530   PLT 96* 07/13/2011 0530   MCV 97.6 07/13/2011 0530    No results found for this or any previous visit (from the past 240 hour(s)).  Studies/Results: Ct Abdomen Pelvis W Contrast  07/13/2011  *RADIOLOGY REPORT*  Clinical Data: Abdominal pain.  History of pancreatitis.  CT ABDOMEN AND PELVIS WITH CONTRAST  Technique:  Multidetector CT imaging of the abdomen and pelvis was performed following the standard protocol during bolus administration of  intravenous contrast.  Contrast: OMNIPAQUE IOHEXOL 300 MG/ML IV SOLN  Comparison: 01/26/2011  Findings: Dependent atelectasis in the lung bases.  Small esophageal hiatal hernia.  Surgical absence of the gallbladder.  Mild intra and extrahepatic bile duct dilatation.  This is likely physiologic postcholecystectomy.  No discrete mass or stone is visualized.  The pancreas demonstrates diffuse fatty atrophy.  No discrete pancreatic mass is visualized.  The appearance is similar to the previous study.  No inflammatory stranding or fluid collections demonstrated around the pancreas.  No focal liver lesions.  Spleen size is normal.  No adrenal gland nodules.  Large cyst in the midportion of the left kidney measuring 7.5 cm diameter, stable since previous study.  No solid mass or pyelocaliectasis demonstrated in the kidneys.  Small sub centimeter cysts within the renal parenchyma bilaterally.  The stomach and small bowel are not distended.  Contrast material flows to the sigmoid colon suggesting no evidence of obstruction.  No free air or free fluid in the abdomen.  Abdominal aortic aneurysm with peripheral calcification and mural thrombus.  The aneurysm measures about 4.2 cm diameter. Previous measurement is 3.9 cm.  This is likely within margin of error of measurement.  Pelvis:  There is a large left inguinal hernia  containing bowel loops.  No proximal obstruction.  This is stable. Diverticulosis of the sigmoid colon without inflammatory change.  Bladder wall is not thickened.  Prostate gland is moderately enlarged. No free or loculated pelvic fluid collections.  The appendix is normal. Generative changes in the lumbar spine.  No significant change since previous study.  IMPRESSION: Post cholecystectomy with likely physiologic bile duct dilatation. No pancreatic ductal dilatation or inflammatory changes demonstrated.  Diffuse pancreatic fatty atrophy.  4.2 cm diameter abdominal aortic aneurysm appears stable.  Large  left inguinal hernia containing multiple small bowel loops but without proximal obstruction.  Diverticulosis without diverticulitis in the sigmoid colon.  Original Report Authenticated By: Marlon Pel, M.D.    Medications: Scheduled Meds:   . cholecalciferol  400 Units Oral BID  . cyanocobalamin  1,000 mcg Intramuscular Q30 days  . ferrous fumarate  1 tablet Oral Daily  . fluticasone  2 puff Inhalation BID  . furosemide  20 mg Oral Daily  . methylPREDNISolone (SOLU-MEDROL) injection  20 mg Intravenous Daily  . pantoprazole (PROTONIX) IV  40 mg Intravenous Q12H  . predniSONE  5 mg Oral BID WC  . DISCONTD: sodium chloride   Intravenous Once   Continuous Infusions:   . DISCONTD: dextrose 5 % and 0.9% NaCl 75 mL/hr at 07/14/11 0024   PRN Meds:.fentaNYL, hydrALAZINE, midazolam, morphine, ondansetron (ZOFRAN) IV, ondansetron  Assessment/Plan:  78 y/o male with hx of PMR, HTN, hx of colon ca transferred from [facility] hospital where he presented with abdominal pain and found to have transaminitis with dilated CBD on imaging. ( has hx of cholecystectomy). Patient transferred here for GI eval.   acute pancreatitis with obstructive jaundice  Unclear if this is related to gallstone. CT abdomen does not show any stone and no pancreatic duct dilatation . his pain now resolved. Possible to have passed the stone. transaminaitis slowly improving as well . Today's labs pending seen by GI ( hung) and EUS done on 2/15, which was not a complete study due to fluctuation in patient's sats. CBD noted to be dilated to 9 mm without any stones, mass  or sludge. Pancreatic parenchyma appear normal. Follow up with repeat labs today  GI recommends conservative management    HTN  Resume home meds  Hx of PMR  on chr steroids     AAA  CT abd done shows size of 4.2 , slight increase from 3.9 5 months back    Regular diet  DVT prophylaxis   LOS: 2 days   Roma Bierlein 07/14/2011, 12:41  PM

## 2011-07-15 DIAGNOSIS — I714 Abdominal aortic aneurysm, without rupture, unspecified: Secondary | ICD-10-CM | POA: Diagnosis present

## 2011-07-15 DIAGNOSIS — E039 Hypothyroidism, unspecified: Secondary | ICD-10-CM | POA: Diagnosis present

## 2011-07-15 LAB — HEPATIC FUNCTION PANEL
ALT: 120 U/L — ABNORMAL HIGH (ref 0–53)
Alkaline Phosphatase: 95 U/L (ref 39–117)
Bilirubin, Direct: 0.3 mg/dL (ref 0.0–0.3)
Indirect Bilirubin: 0.4 mg/dL (ref 0.3–0.9)

## 2011-07-15 MED ORDER — PANTOPRAZOLE SODIUM 40 MG PO TBEC: 40.0000 mg | DELAYED_RELEASE_TABLET | Freq: Two times a day (BID) | ORAL | Status: DC

## 2011-07-15 MED ORDER — BACITRACIN-NEOMYCIN-POLYMYXIN 400-5-5000 EX OINT
TOPICAL_OINTMENT | CUTANEOUS | Status: AC
  Administered 2011-07-15: 10:00:00
  Filled 2011-07-15: qty 1

## 2011-07-15 NOTE — Progress Notes (Signed)
Pharmacy P&T Substitution alert:  Patient appears to be tolerating PO medications. Protonix is scheduled IV.  For cost savings and ease of administration, will change IV Protonix to PO q12.  Thank you!  Reece Leader, Pharm D 07/15/2011 3:08 PM

## 2011-07-15 NOTE — Progress Notes (Signed)
Clinical Social Worker received verbal consult for pt to return to assisted living facility. CSW met with pt to address consult. Pt resides at [facility] in [place], [place]. Pt shared that has a supportive family and his brother will providing transportation to the facility. CSW completed FL2 and faxed it to ALF, along with the discharge summary. Facility is ready to accept pt. CSW is signing off as no further clinical social work needs are identified.   Dede Query, MSW, Theresia Majors [phone]

## 2011-07-15 NOTE — Progress Notes (Signed)
Pt being discharged today back to [facility] [place] (phone [phone]).  Called and s/w Dot Lanes there and they are able to accept pt back to the facility today.  Dr. Gonzella Lex has notified brother and he will come and pick up the pt. To take him back to [facility].   Facility will need FL2 signed and any med changes.  SW on call text paged with information.  Pt informed.

## 2011-07-15 NOTE — Progress Notes (Signed)
FL2 signed and pt ready to go back to [facility].  All discharge instructions printed off and reviewed w/ pt. And brother.   Radiological CD from [facility] given back to pt.  FU in 1 week with Dr. Rudi Heap (pt understands to make appt).  My name and number placed on paperwork in case there are any questions about the pt from [facility].

## 2011-07-15 NOTE — Discharge Summary (Addendum)
Patient ID: QUANTE PETTRY MRN: [id number] DOB/AGE: 11/02/1917 78 y.o.  Admit date: 07/12/2011 Discharge date: 07/15/2011  Primary Care Physician:  Rudi Heap, MD, MD and Helene Kelp , PA in [place]   Discharge Diagnoses:   Principal Problem:  *Obstructive jaundice  Active Problems:  HTN (hypertension)  Polymyalgia rheumatica  H/O colon cancer, stage I hypothyroidism Abdominal aortic aneurysm ( stable)  Medication List  As of 07/15/2011  9:14 AM   STOP taking these medications         sodium phosphate enema         TAKE these medications         acetaminophen 500 MG tablet   Commonly known as: TYLENOL   Take 500 mg by mouth every 6 (six) hours as needed. For pain/fever      aluminum & magnesium hydroxide 225-200 MG/5ML suspension   Commonly known as: MAALOX   Take 30 mLs by mouth every 6 (six) hours as needed. For indigestion      anti-nausea solution   Take 5 mLs by mouth every 15 (fifteen) minutes as needed. As needed to stop emesis      cholecalciferol 400 UNITS Tabs   Commonly known as: VITAMIN D   Take 400 Units by mouth 2 (two) times daily.      cyanocobalamin 1000 MCG/ML injection   Commonly known as: (VITAMIN B-12)   Inject 1,000 mcg into the muscle every 30 (thirty) days.      diphenhydrAMINE 25 MG tablet   Commonly known as: BENADRYL   Take 25 mg by mouth every 4 (four) hours as needed. For allergies/itching      ferrous fumarate 325 (106 FE) MG Tabs   Commonly known as: HEMOCYTE - 106 mg FE   Take 1 tablet by mouth daily.      furosemide 20 MG tablet   Commonly known as: LASIX   Take 20 mg by mouth daily.      GAVISCON 80-14.2 MG Chew   Generic drug: Alum Hydroxide-Mag Trisilicate   Chew 1 tablet by mouth as needed. For bloating      guaiFENesin 100 MG/5ML liquid   Commonly known as: ROBITUSSIN   Take 200 mg by mouth 3 (three) times daily as needed. For cough      levothyroxine 50 MCG tablet   Commonly known as: SYNTHROID, LEVOTHROID     Take 50 mcg by mouth daily.      lisinopril 10 MG tablet   Commonly known as: PRINIVIL,ZESTRIL   Take 10 mg by mouth daily.      Loperamide HCl 2 MG Chew   Chew 2 mg by mouth 3 (three) times daily as needed. For loose stool      magnesium hydroxide 400 MG/5ML suspension   Commonly known as: MILK OF MAGNESIA   Take 30 mLs by mouth daily as needed. For constipation      predniSONE 5 MG tablet   Commonly known as: DELTASONE   Take 5 mg by mouth 2 (two) times daily.      PULMICORT FLEXHALER 90 MCG/ACT inhaler   Generic drug: Budesonide   Inhale 1 puff into the lungs 2 (two) times daily.            Disposition and Follow-up:  Follow up with PCP in 1 week  needs repeat LFTs checked  Consults:  Hung ( GI)  Significant Diagnostic Studies:  Ct Abdomen Pelvis W Contrast  07/13/2011  *RADIOLOGY REPORT*  Clinical Data: Abdominal  pain.  History of pancreatitis.  CT ABDOMEN AND PELVIS WITH CONTRAST  Technique:  Multidetector CT imaging of the abdomen and pelvis was performed following the standard protocol during bolus administration of intravenous contrast.  Contrast: OMNIPAQUE IOHEXOL 300 MG/ML IV SOLN  Comparison: 01/26/2011  Findings: Dependent atelectasis in the lung bases.  Small esophageal hiatal hernia.  Surgical absence of the gallbladder.  Mild intra and extrahepatic bile duct dilatation.  This is likely physiologic postcholecystectomy.  No discrete mass or stone is visualized.  The pancreas demonstrates diffuse fatty atrophy.  No discrete pancreatic mass is visualized.  The appearance is similar to the previous study.  No inflammatory stranding or fluid collections demonstrated around the pancreas.  No focal liver lesions.  Spleen size is normal.  No adrenal gland nodules.  Large cyst in the midportion of the left kidney measuring 7.5 cm diameter, stable since previous study.  No solid mass or pyelocaliectasis demonstrated in the kidneys.  Small sub centimeter cysts within  the renal parenchyma bilaterally.  The stomach and small bowel are not distended.  Contrast material flows to the sigmoid colon suggesting no evidence of obstruction.  No free air or free fluid in the abdomen.  Abdominal aortic aneurysm with peripheral calcification and mural thrombus.  The aneurysm measures about 4.2 cm diameter. Previous measurement is 3.9 cm.  This is likely within margin of error of measurement.  Pelvis:  There is a large left inguinal hernia containing bowel loops.  No proximal obstruction.  This is stable. Diverticulosis of the sigmoid colon without inflammatory change.  Bladder wall is not thickened.  Prostate gland is moderately enlarged. No free or loculated pelvic fluid collections.  The appendix is normal. Generative changes in the lumbar spine.  No significant change since previous study.  IMPRESSION: Post cholecystectomy with likely physiologic bile duct dilatation. No pancreatic ductal dilatation or inflammatory changes demonstrated.  Diffuse pancreatic fatty atrophy.  4.2 cm diameter abdominal aortic aneurysm appears stable.  Large left inguinal hernia containing multiple small bowel loops but without proximal obstruction.  Diverticulosis without diverticulitis in the sigmoid colon.  Original Report Authenticated By: Marlon Pel, M.D.    Brief H and P: For complete details please refer to admission H and P, but in brief 78 year-old male with history of hypertension polymyalgia rheumatica history of colon cancer status post resection and left inguinal hernia had gone to [facility] because of abdominal pain. Patient had initially chest pain after which each he started experiencing abdominal pain and [facility] was found to have pancreatitis with obstructive jaundice. His total bilirubin on admission was found to be around 5.4 and repeat done today was found to be around 6.3 direct bilirubin was 4.2 sonogram of the abdomen today showed a dilated CBD along with  abdominal aortic aneurysm patient is post cholecystectomy. Patient was transferred to [facility] for further management as there was no gastroenterologist available at that facility. Patient states still he has mild abdominal discomfort which increases on eating. Patient denies any chest pain at this time, denies any cough, shortness of breath nausea vomiting or diarrhea. Denies any fever chills. Presently he is not in any acute distress. His cardiac enzymes at [facility] was negative. His LFTs done on July 11, 2011 where showing total bilirubin 6.3 direct bilirubin 4.2 amylase 213 lipase 69 heart phosphatase 109 AST 172 ALT 213. His PT/INR was 12.7 and 0.9 UA was negative for nitrites and leukocyte Estrace. Basic metabolic panel showed  glucose of 177 BUN of 25 creatinine of 0.8 calcium 8.7 sodium 138 potassium 3.7 chloride 105 products a 22 his cardiac enzymes total CPK was 29 CPK MB 3.4 relative index 11.7 troponin I 0.03. CBC showed a WBC of 10.2 hemoglobin 14.5 hematocrit 42.8 platelets 119. Sonogram of the abdomen done today was read as post cholecystectomy with dilated CBD 11 mm diameter with associated intrahepatic biliary dilatation large left renal cyst abdominal aortic aneurysm measures 4.1 into 3.6 cm in axial dimensions and extending at least 5.9 cm in length inadequate visualization of pancreatic head and distal CBD.   Physical Exam on Discharge:  Filed Vitals:   07/14/11 2148 07/14/11 2157 07/15/11 0516 07/15/11 0859  BP:  136/67 131/74   Pulse: 82 51 55   Temp:  97.7 F (36.5 C) 97.7 F (36.5 C)   TempSrc:  Oral Oral   Resp: 16 16 16    Height:      Weight:   54.9 kg (121 lb 0.5 oz)   SpO2: 95% 97% 97% 97%     Intake/Output Summary (Last 24 hours) at 07/15/11 0914 Last data filed at 07/15/11 0516  Gross per 24 hour  Intake    480 ml  Output   1250 ml  Net   -770 ml    General: Alert, awake, oriented x3, in no acute distress. HEENT: No bruits, no  goiter.no pallor, anicteric Heart: Regular rate and rhythm, without murmurs, rubs, gallops. Lungs: Clear to auscultation bilaterally. Abdomen: Soft, nontender, nondistended, positive bowel sounds. Large left inguinal hernia Extremities: No clubbing cyanosis or edema with positive pedal pulses. Neuro: Grossly intact, nonfocal.  CBC:    Component Value Date/Time   WBC 11.8* 07/14/2011 1352   HGB 14.9 07/14/2011 1352   HCT 43.5 07/14/2011 1352   PLT 106* 07/14/2011 1352   MCV 97.3 07/14/2011 1352    Basic Metabolic Panel:    Component Value Date/Time   NA 137 07/13/2011 0530   K 3.6 07/13/2011 0530   CL 106 07/13/2011 0530   CO2 21 07/13/2011 0530   BUN 22 07/13/2011 0530   CREATININE 0.78 07/13/2011 0530   GLUCOSE 94 07/13/2011 0530   CALCIUM 8.4 07/13/2011 0530    Hospital Course:   acute pancreatitis with obstructive jaundice  Patient transferred form [facility] where he initially presented with abdominal pain with features of obstructive jaundice. CT abdomen does not show any stone and no pancreatic duct dilatation. It is  possible to have passed the stone. His pain has now resolved.   transaminitis improving.  His bilirubin is now normal with normal AST and ALk P, ALT of 120  .  seen by GI ( hung) and EUS done on 2/15, which was not a complete study due to fluctuation in patient's sats. CBD noted to be dilated to 9 mm without any stones, mass or sludge. Pancreatic parenchyma appeared normal.  GI recommends conservative management  He is stable and can be discharged home with outpt follow up with his PCP and get repeat LFTs checked in 1 week.  Remaining medical issues stable. He has hx of AAA. CT abd and pelvis done here shows stable AAA with size of 4.2 cm ( slight increase from 3.9cm  about 5 months back ) and should be followed up as outpt.   Plan discussed with his brother Mayo Ao and son in law.      Time spent on Discharge: 45 minutes  Signed: Eddie North 07/15/2011, 9:14 AM

## 2011-07-16 ENCOUNTER — Encounter (HOSPITAL_COMMUNITY): Payer: Self-pay | Admitting: Gastroenterology

## 2011-11-27 ENCOUNTER — Ambulatory Visit: Payer: 59 | Admitting: Physical Therapy

## 2011-12-28 ENCOUNTER — Other Ambulatory Visit: Payer: Self-pay

## 2012-11-01 ENCOUNTER — Encounter (HOSPITAL_COMMUNITY): Payer: Self-pay | Admitting: *Deleted

## 2012-11-01 ENCOUNTER — Emergency Department (HOSPITAL_COMMUNITY): Payer: 59

## 2012-11-01 ENCOUNTER — Emergency Department (HOSPITAL_COMMUNITY)
Admission: EM | Admit: 2012-11-01 | Discharge: 2012-11-01 | Disposition: A | Discharge status: Skilled Nursing Facility | Payer: 59 | Attending: Emergency Medicine | Admitting: Emergency Medicine

## 2012-11-01 DIAGNOSIS — Z8739 Personal history of other diseases of the musculoskeletal system and connective tissue: Secondary | ICD-10-CM | POA: Insufficient documentation

## 2012-11-01 DIAGNOSIS — R1033 Periumbilical pain: Secondary | ICD-10-CM | POA: Insufficient documentation

## 2012-11-01 DIAGNOSIS — Z862 Personal history of diseases of the blood and blood-forming organs and certain disorders involving the immune mechanism: Secondary | ICD-10-CM | POA: Insufficient documentation

## 2012-11-01 DIAGNOSIS — Z8639 Personal history of other endocrine, nutritional and metabolic disease: Secondary | ICD-10-CM | POA: Insufficient documentation

## 2012-11-01 DIAGNOSIS — K4090 Unilateral inguinal hernia, without obstruction or gangrene, not specified as recurrent: Secondary | ICD-10-CM | POA: Insufficient documentation

## 2012-11-01 DIAGNOSIS — M545 Low back pain, unspecified: Secondary | ICD-10-CM | POA: Insufficient documentation

## 2012-11-01 DIAGNOSIS — F0390 Unspecified dementia without behavioral disturbance: Secondary | ICD-10-CM | POA: Insufficient documentation

## 2012-11-01 DIAGNOSIS — Z79899 Other long term (current) drug therapy: Secondary | ICD-10-CM | POA: Insufficient documentation

## 2012-11-01 DIAGNOSIS — IMO0002 Reserved for concepts with insufficient information to code with codable children: Secondary | ICD-10-CM | POA: Insufficient documentation

## 2012-11-01 DIAGNOSIS — R109 Unspecified abdominal pain: Secondary | ICD-10-CM

## 2012-11-01 DIAGNOSIS — G8929 Other chronic pain: Secondary | ICD-10-CM | POA: Insufficient documentation

## 2012-11-01 DIAGNOSIS — Z880 Allergy status to penicillin: Secondary | ICD-10-CM | POA: Insufficient documentation

## 2012-11-01 DIAGNOSIS — I10 Essential (primary) hypertension: Secondary | ICD-10-CM | POA: Insufficient documentation

## 2012-11-01 DIAGNOSIS — Z85038 Personal history of other malignant neoplasm of large intestine: Secondary | ICD-10-CM | POA: Insufficient documentation

## 2012-11-01 DIAGNOSIS — E079 Disorder of thyroid, unspecified: Secondary | ICD-10-CM | POA: Insufficient documentation

## 2012-11-01 DIAGNOSIS — Z8719 Personal history of other diseases of the digestive system: Secondary | ICD-10-CM | POA: Insufficient documentation

## 2012-11-01 HISTORY — DX: Diarrhea, unspecified: R19.7

## 2012-11-01 HISTORY — DX: Diverticulosis of intestine, part unspecified, without perforation or abscess without bleeding: K57.90

## 2012-11-01 HISTORY — DX: Hypokalemia: E87.6

## 2012-11-01 HISTORY — DX: Disorder of thyroid, unspecified: E07.9

## 2012-11-01 LAB — COMPREHENSIVE METABOLIC PANEL
AST: 20 U/L (ref 0–37)
Albumin: 3 g/dL — ABNORMAL LOW (ref 3.5–5.2)
BUN: 15 mg/dL (ref 6–23)
Creatinine, Ser: 0.99 mg/dL (ref 0.50–1.35)
Potassium: 3.2 mEq/L — ABNORMAL LOW (ref 3.5–5.1)
Total Protein: 6.7 g/dL (ref 6.0–8.3)

## 2012-11-01 LAB — CBC WITH DIFFERENTIAL/PLATELET
Eosinophils Relative: 1 % (ref 0–5)
HCT: 43.1 % (ref 39.0–52.0)
Lymphocytes Relative: 8 % — ABNORMAL LOW (ref 12–46)
Lymphs Abs: 0.8 10*3/uL (ref 0.7–4.0)
MCV: 92.9 fL (ref 78.0–100.0)
Monocytes Absolute: 0.7 10*3/uL (ref 0.1–1.0)
Monocytes Relative: 6 % (ref 3–12)
RBC: 4.64 MIL/uL (ref 4.22–5.81)
WBC: 11 10*3/uL — ABNORMAL HIGH (ref 4.0–10.5)

## 2012-11-01 LAB — LIPASE, BLOOD: Lipase: 10 U/L — ABNORMAL LOW (ref 11–59)

## 2012-11-01 MED ORDER — MORPHINE SULFATE 2 MG/ML IJ SOLN
2.0000 mg | Freq: Once | INTRAMUSCULAR | Status: AC
  Administered 2012-11-01: 2 mg via INTRAVENOUS
  Filled 2012-11-01: qty 1

## 2012-11-01 MED ORDER — SODIUM CHLORIDE 0.9 % IV BOLUS (SEPSIS)
500.0000 mL | Freq: Once | INTRAVENOUS | Status: AC
  Administered 2012-11-01: 500 mL via INTRAVENOUS

## 2012-11-01 MED ORDER — POTASSIUM CHLORIDE CRYS ER 20 MEQ PO TBCR
40.0000 meq | EXTENDED_RELEASE_TABLET | Freq: Once | ORAL | Status: AC
  Administered 2012-11-01: 40 meq via ORAL
  Filled 2012-11-01: qty 2

## 2012-11-01 MED ORDER — ONDANSETRON HCL 4 MG/2ML IJ SOLN
4.0000 mg | Freq: Once | INTRAMUSCULAR | Status: AC
  Administered 2012-11-01: 4 mg via INTRAVENOUS
  Filled 2012-11-01: qty 2

## 2012-11-01 NOTE — ED Notes (Signed)
Patient w/chronic abdominal and lower back pain, checked out multiple times according to daughter.  States usually seen at [facility].

## 2012-11-01 NOTE — ED Notes (Signed)
Notified patient's daughter that he will be going back to [facility] and that he was feeling better.  Notified [place] of need for return transportation.

## 2012-11-01 NOTE — ED Provider Notes (Signed)
History    This chart was scribed for Donnetta Hutching, MD by Sofie Rower, ED Scribe. The patient was seen in room APA10/APA10 and the patient's care was started at 10:04AM.   Level 5 Caveat: Dementia.  CSN: [id number]  Arrival date & time 11/01/12  1003   First MD Initiated Contact with Patient 11/01/12 1004      Chief Complaint  Patient presents with  . Abdominal Pain  . Back Pain    (Consider location/radiation/quality/duration/timing/severity/associated sxs/prior treatment) HPI  Corey Walker is a 79 y.o. male , with a hx of diverticulosis, hypertension, polymyalgia rheumatica, colon cancer, colon surgery and EUS (Performed on 07/13/11 by Dr. Elnoria Howard at [facility])  who presents to the Emergency Department complaining of sudden, progressively worsening, non radiating, abdominal pain, located at the periumbelical region, onset today (11/01/12). The pt reports he has chronic abdominal pain and lower back pain, for which he has received multiple previous evaluations at [facility]. The pt informs his back pain often radiates into his right hip, however, he is predominantly occupied with his abdominal pain at the present point and time. Furthermore, the pt rates his abdominal pain at 7/10 at worst.  The pt denies experiencing any difficulty urinating.   The pt does not smoke or drink alcohol.   PCP is Dr. Caryn Bee.    Past Medical History  Diagnosis Date  . Hypertension   . Polymyalgia rheumatica   . Diverticulosis   . Thyroid disease   . Diarrhea   . Hypokalemia   . Colon cancer     Past Surgical History  Procedure Laterality Date  . Colon surgery    . Eus  07/13/2011    Procedure: UPPER ENDOSCOPIC ULTRASOUND (EUS) LINEAR;  Surgeon: Theda Belfast, MD;  Location: [facility];  Service: Endoscopy;  Laterality: N/A;    History reviewed. No pertinent family history.  History  Substance Use Topics  . Smoking status: Never Smoker   . Smokeless tobacco: Not on file  . Alcohol  Use: No      Review of Systems  Unable to perform ROS: Dementia    Allergies  Darvocet; Fish oil; Lactose intolerance (gi); Loratadine; and Penicillins  Home Medications   Current Outpatient Rx  Name  Route  Sig  Dispense  Refill  . Budesonide (PULMICORT FLEXHALER) 90 MCG/ACT inhaler   Inhalation   Inhale 1 puff into the lungs 2 (two) times daily.         . cholecalciferol (VITAMIN D) 400 UNITS TABS   Oral   Take 400 Units by mouth 2 (two) times daily.         . ferrous sulfate 325 (65 FE) MG tablet   Oral   Take 325 mg by mouth daily with breakfast.         . furosemide (LASIX) 20 MG tablet   Oral   Take 20 mg by mouth daily.         Marland Kitchen levothyroxine (SYNTHROID, LEVOTHROID) 50 MCG tablet   Oral   Take 50 mcg by mouth daily.         Marland Kitchen lisinopril (PRINIVIL,ZESTRIL) 10 MG tablet   Oral   Take 10 mg by mouth daily.         . predniSONE (DELTASONE) 5 MG tablet   Oral   Take 5 mg by mouth 2 (two) times daily.         Marland Kitchen acetaminophen (TYLENOL) 500 MG tablet   Oral   Take  500 mg by mouth every 6 (six) hours as needed. For pain/fever         . aluminum & magnesium hydroxide (MAALOX) 225-200 MG/5ML suspension   Oral   Take 30 mLs by mouth every 6 (six) hours as needed. For indigestion         . anti-nausea (EMETROL) solution   Oral   Take 5 mLs by mouth every 15 (fifteen) minutes as needed. As needed to stop emesis         . cyanocobalamin (,VITAMIN B-12,) 1000 MCG/ML injection   Intramuscular   Inject 1,000 mcg into the muscle every 30 (thirty) days.         . diphenhydrAMINE (BENADRYL) 25 MG tablet   Oral   Take 25 mg by mouth every 4 (four) hours as needed. For allergies/itching         . guaiFENesin (ROBITUSSIN) 100 MG/5ML liquid   Oral   Take 200 mg by mouth 3 (three) times daily as needed. For cough         . Loperamide HCl 2 MG CHEW   Oral   Chew 2 mg by mouth 3 (three) times daily as needed. For loose stool         .  magnesium hydroxide (MILK OF MAGNESIA) 400 MG/5ML suspension   Oral   Take 30 mLs by mouth daily as needed. For constipation           BP 113/65  Pulse 83  Temp(Src) 98 F (36.7 C) (Oral)  Resp 17  Ht 5\' 7"  (1.702 m)  Wt 175 lb (79.379 kg)  BMI 27.4 kg/m2  SpO2 98%  Physical Exam  Nursing note and vitals reviewed. Constitutional: He is oriented to person, place, and time. He appears well-developed and well-nourished.  HENT:  Head: Normocephalic and atraumatic.  Eyes: Conjunctivae and EOM are normal. Pupils are equal, round, and reactive to light.  Neck: Normal range of motion. Neck supple.  Cardiovascular: Normal rate, regular rhythm and normal heart sounds.   Pulmonary/Chest: Effort normal and breath sounds normal.  Abdominal: Soft. Bowel sounds are normal. A hernia is present. Hernia confirmed positive in the left inguinal area.  Left inguinal hernia present (Chronic).   No suprapubic tenderness  Genitourinary:  No flank tenderness  Musculoskeletal: Normal range of motion.  Neurological: He is alert and oriented to person, place, and time.  Skin: Skin is warm and dry.  Psychiatric: He has a normal mood and affect.    ED Course  Procedures (including critical care time)  DIAGNOSTIC STUDIES: Oxygen Saturation is 98% on room air, normal by my interpretation.    COORDINATION OF CARE:  10:23 AM- Treatment plan discussed with patient. Pt agrees with treatment.  10:24 AM- Phone consultation with Pt's daughter. Treatment plan discussed with patients daughter. Pt's daughter agrees with treatment.        Results for orders placed during the hospital encounter of 11/01/12  COMPREHENSIVE METABOLIC PANEL      Result Value Range   Sodium 140  135 - 145 mEq/L   Potassium 3.2 (*) 3.5 - 5.1 mEq/L   Chloride 102  96 - 112 mEq/L   CO2 27  19 - 32 mEq/L   Glucose, Bld 106 (*) 70 - 99 mg/dL   BUN 15  6 - 23 mg/dL   Creatinine, Ser 9.56  0.50 - 1.35 mg/dL   Calcium 9.2  8.4  - 21.3 mg/dL   Total Protein 6.7  6.0 -  8.3 g/dL   Albumin 3.0 (*) 3.5 - 5.2 g/dL   AST 20  0 - 37 U/L   ALT 14  0 - 53 U/L   Alkaline Phosphatase 108  39 - 117 U/L   Total Bilirubin 1.2  0.3 - 1.2 mg/dL   GFR calc non Af Amer 68 (*) >90 mL/min   GFR calc Af Amer 79 (*) >90 mL/min  CBC WITH DIFFERENTIAL      Result Value Range   WBC 11.0 (*) 4.0 - 10.5 K/uL   RBC 4.64  4.22 - 5.81 MIL/uL   Hemoglobin 14.4  13.0 - 17.0 g/dL   HCT 16.1  09.6 - 04.5 %   MCV 92.9  78.0 - 100.0 fL   MCH 31.0  26.0 - 34.0 pg   MCHC 33.4  30.0 - 36.0 g/dL   RDW 40.9  81.1 - 91.4 %   Platelets 141 (*) 150 - 400 K/uL   Neutrophils Relative % 86 (*) 43 - 77 %   Neutro Abs 9.4 (*) 1.7 - 7.7 K/uL   Lymphocytes Relative 8 (*) 12 - 46 %   Lymphs Abs 0.8  0.7 - 4.0 K/uL   Monocytes Relative 6  3 - 12 %   Monocytes Absolute 0.7  0.1 - 1.0 K/uL   Eosinophils Relative 1  0 - 5 %   Eosinophils Absolute 0.1  0.0 - 0.7 K/uL   Basophils Relative 0  0 - 1 %   Basophils Absolute 0.0  0.0 - 0.1 K/uL  LIPASE, BLOOD      Result Value Range   Lipase 10 (*) 11 - 59 U/L   Dg Abd Acute W/chest  11/01/2012   *RADIOLOGY REPORT*  Clinical Data: Umbilical abdominal pain  ACUTE ABDOMEN SERIES (ABDOMEN 2 VIEW & CHEST 1 VIEW)  Comparison: CT abdomen pelvis dated 10/01/2011  Findings: Chronic interstitial markings.  No focal consolidation.  Apparent opacity at the left lung base reflects epicardial fat.  The heart is normal in size.  Nonspecific bowel gas pattern, without disproportionate small bowel dilatation to suggest small bowel obstruction.  No evidence of free air under the diaphragm on the upright view.  Surgical clips in the right abdomen.  Degenerative changes of the visualized thoracolumbar spine.  IMPRESSION: No evidence of acute cardiopulmonary disease.  No findings to suggest small bowel obstruction or free air.  Surgical clips in the right abdomen.   Original Report Authenticated By: Charline Bills, M.D.      No  diagnosis found.    MDM  No acute abdomen.   Normal vital signs. No clinical evidence of abdominal aortic aneurysm.  Patient does not appear ill.  Screening labs and x-ray negative.      I personally performed the services described in this documentation, which was scribed in my presence. The recorded information has been reviewed and is accurate.    Donnetta Hutching, MD 11/01/12 [phone]

## 2012-11-01 NOTE — ED Notes (Signed)
Pt ambulated to the rest room, pt's urine sample is placed in the blue bin.

## 2013-03-05 ENCOUNTER — Encounter (INDEPENDENT_AMBULATORY_CARE_PROVIDER_SITE_OTHER): Payer: Self-pay

## 2013-03-05 ENCOUNTER — Encounter: Payer: Self-pay | Admitting: Family Medicine

## 2013-03-05 ENCOUNTER — Ambulatory Visit (INDEPENDENT_AMBULATORY_CARE_PROVIDER_SITE_OTHER): Payer: Medicare Other | Admitting: Family Medicine

## 2013-03-05 VITALS — BP 157/72 | HR 74 | Temp 98.2°F | Ht 67.0 in | Wt 124.0 lb

## 2013-03-05 DIAGNOSIS — E039 Hypothyroidism, unspecified: Secondary | ICD-10-CM

## 2013-03-05 DIAGNOSIS — R5381 Other malaise: Secondary | ICD-10-CM

## 2013-03-05 DIAGNOSIS — I499 Cardiac arrhythmia, unspecified: Secondary | ICD-10-CM

## 2013-03-05 DIAGNOSIS — E538 Deficiency of other specified B group vitamins: Secondary | ICD-10-CM

## 2013-03-05 LAB — POCT CBC
Granulocyte percent: 75.3 %G (ref 37–80)
HCT, POC: 47 % (ref 43.5–53.7)
Hemoglobin: 15.6 g/dL (ref 14.1–18.1)
Lymph, poc: 1.8 (ref 0.6–3.4)
MCH, POC: 29.9 pg (ref 27–31.2)
MCHC: 33.2 g/dL (ref 31.8–35.4)
MCV: 90.1 fL (ref 80–97)
MPV: 7.8 fL (ref 0–99.8)
POC Granulocyte: 6.1 (ref 2–6.9)
POC LYMPH PERCENT: 21.7 %L (ref 10–50)
Platelet Count, POC: 205 10*3/uL (ref 142–424)
RBC: 5.2 M/uL (ref 4.69–6.13)
RDW, POC: 15.7 %
WBC: 8.1 10*3/uL (ref 4.6–10.2)

## 2013-03-05 NOTE — Progress Notes (Signed)
  Subjective:    Patient ID: Corey Walker, male    DOB: 09-15-1917, 79 y.o.   MRN: [id number]  HPI This 79 y.o. male presents for evaluation of fatigue and not feeling well according To the staff.  He  Has hx of hypothyroidism and b12 deficiency.   Review of Systems C/o fatigue No chest pain, SOB, HA, dizziness, vision change, N/V, diarrhea, constipation, dysuria, urinary urgency or frequency, myalgias, arthralgias or rash.     Objective:   Physical Exam  Vital signs noted  Well developed well nourished male.  HEENT - Head atraumatic Normocephalic                Eyes - PERRLA, Conjuctiva - clear Sclera- Clear EOMI                Ears - EAC's Wnl TM's Wnl Gross Hearing WNL                Nose - Nares patent                 Throat - oropharanx wnl Respiratory - Lungs CTA bilateral Cardiac - Irregular rate and rythm GI - Abdomen soft Nontender and bowel sounds active x 4 Extremities - No edema. Neuro - Grossly intact.      Assessment & Plan:  Irregular heart rate - Plan: EKG 12-Lead- Refuses stating the EKG hurts his skin.  Advised he Could have Afib which could be contributing and he refuses.  Recommend he follow up prn or go To the ED if not feeling better.  Other malaise and fatigue - Plan: POCT CBC, CMP14+EGFR, Thyroid Panel With TSH, Vitamin B12  B12 deficiency - Plan: Vitamin B12  Unspecified hypothyroidism - Plan: Thyroid Panel With TSH  Deatra Canter FNP

## 2013-03-05 NOTE — Patient Instructions (Signed)

## 2013-03-06 ENCOUNTER — Other Ambulatory Visit: Payer: Self-pay | Admitting: Family Medicine

## 2013-03-06 DIAGNOSIS — R748 Abnormal levels of other serum enzymes: Secondary | ICD-10-CM

## 2013-03-06 LAB — CMP14+EGFR
ALT: 329 IU/L — ABNORMAL HIGH (ref 0–44)
AST: 291 IU/L — ABNORMAL HIGH (ref 0–40)
Albumin/Globulin Ratio: 1.8 (ref 1.1–2.5)
Albumin: 4.1 g/dL (ref 3.2–4.6)
Alkaline Phosphatase: 310 IU/L — ABNORMAL HIGH (ref 39–117)
BUN/Creatinine Ratio: 16 (ref 10–22)
BUN: 15 mg/dL (ref 10–36)
CO2: 27 mmol/L (ref 18–29)
Calcium: 9.4 mg/dL (ref 8.6–10.2)
Chloride: 100 mmol/L (ref 97–108)
Creatinine, Ser: 0.96 mg/dL (ref 0.76–1.27)
GFR calc Af Amer: 78 mL/min/{1.73_m2} (ref >59)
GFR calc non Af Amer: 68 mL/min/{1.73_m2} (ref >59)
Globulin, Total: 2.3 g/dL (ref 1.5–4.5)
Glucose: 92 mg/dL (ref 65–99)
Potassium: 4.3 mmol/L (ref 3.5–5.2)
Sodium: 145 mmol/L — ABNORMAL HIGH (ref 134–144)
Total Bilirubin: 2.7 mg/dL — ABNORMAL HIGH (ref 0.0–1.2)
Total Protein: 6.4 g/dL (ref 6.0–8.5)

## 2013-03-06 LAB — THYROID PANEL WITH TSH
Free Thyroxine Index: 2 (ref 1.2–4.9)
T3 Uptake Ratio: 32 % (ref 24–39)
T4, Total: 6.3 ug/dL (ref 4.5–12.0)
TSH: 2.48 u[IU]/mL (ref 0.450–4.500)

## 2013-03-06 LAB — VITAMIN B12: Vitamin B-12: 861 pg/mL (ref 211–946)

## 2013-03-09 ENCOUNTER — Other Ambulatory Visit: Payer: Self-pay | Admitting: *Deleted

## 2013-03-09 DIAGNOSIS — R109 Unspecified abdominal pain: Secondary | ICD-10-CM

## 2013-03-09 DIAGNOSIS — R197 Diarrhea, unspecified: Secondary | ICD-10-CM

## 2013-03-10 ENCOUNTER — Ambulatory Visit (HOSPITAL_COMMUNITY)
Admission: RE | Admit: 2013-03-10 | Discharge: 2013-03-10 | Disposition: A | Discharge status: Home / Self Care | Payer: Medicare Other | Source: Ambulatory Visit | Attending: Family Medicine | Admitting: Family Medicine

## 2013-03-10 DIAGNOSIS — N200 Calculus of kidney: Secondary | ICD-10-CM | POA: Insufficient documentation

## 2013-03-10 DIAGNOSIS — Z85038 Personal history of other malignant neoplasm of large intestine: Secondary | ICD-10-CM | POA: Insufficient documentation

## 2013-03-10 DIAGNOSIS — R9389 Abnormal findings on diagnostic imaging of other specified body structures: Secondary | ICD-10-CM | POA: Insufficient documentation

## 2013-03-10 DIAGNOSIS — I714 Abdominal aortic aneurysm, without rupture, unspecified: Secondary | ICD-10-CM | POA: Insufficient documentation

## 2013-03-10 DIAGNOSIS — R109 Unspecified abdominal pain: Secondary | ICD-10-CM | POA: Insufficient documentation

## 2013-03-10 DIAGNOSIS — K5730 Diverticulosis of large intestine without perforation or abscess without bleeding: Secondary | ICD-10-CM | POA: Insufficient documentation

## 2013-03-10 DIAGNOSIS — R197 Diarrhea, unspecified: Secondary | ICD-10-CM | POA: Insufficient documentation

## 2013-03-11 ENCOUNTER — Ambulatory Visit (HOSPITAL_COMMUNITY): Payer: Medicare Other

## 2013-03-11 ENCOUNTER — Telehealth: Payer: Self-pay | Admitting: Family Medicine

## 2013-03-12 ENCOUNTER — Other Ambulatory Visit: Payer: Self-pay | Admitting: *Deleted

## 2013-03-12 ENCOUNTER — Ambulatory Visit: Payer: Medicare Other | Admitting: Family Medicine

## 2013-03-12 DIAGNOSIS — N200 Calculus of kidney: Secondary | ICD-10-CM

## 2013-03-12 NOTE — Telephone Encounter (Signed)
Discussed with daughter 

## 2013-03-18 ENCOUNTER — Telehealth: Payer: Self-pay | Admitting: Family Medicine

## 2013-03-19 NOTE — Telephone Encounter (Signed)
NP called and aware we do not have record of TB test in over a year

## 2013-05-19 ENCOUNTER — Emergency Department (HOSPITAL_COMMUNITY): Payer: Medicare Other

## 2013-05-19 ENCOUNTER — Encounter (HOSPITAL_COMMUNITY): Payer: Self-pay | Admitting: Emergency Medicine

## 2013-05-19 ENCOUNTER — Inpatient Hospital Stay (HOSPITAL_COMMUNITY)
Admission: EM | Admit: 2013-05-19 | Discharge: 2013-05-22 | DRG: 440 | Disposition: A | Discharge status: Nursing Home (Includes ICF) | Payer: Medicare Other | Attending: Internal Medicine | Admitting: Internal Medicine

## 2013-05-19 DIAGNOSIS — F411 Generalized anxiety disorder: Secondary | ICD-10-CM | POA: Diagnosis present

## 2013-05-19 DIAGNOSIS — I714 Abdominal aortic aneurysm, without rupture, unspecified: Secondary | ICD-10-CM | POA: Diagnosis present

## 2013-05-19 DIAGNOSIS — IMO0002 Reserved for concepts with insufficient information to code with codable children: Secondary | ICD-10-CM

## 2013-05-19 DIAGNOSIS — R7402 Elevation of levels of lactic acid dehydrogenase (LDH): Secondary | ICD-10-CM | POA: Diagnosis present

## 2013-05-19 DIAGNOSIS — E876 Hypokalemia: Secondary | ICD-10-CM | POA: Diagnosis present

## 2013-05-19 DIAGNOSIS — K831 Obstruction of bile duct: Secondary | ICD-10-CM | POA: Diagnosis present

## 2013-05-19 DIAGNOSIS — Z85038 Personal history of other malignant neoplasm of large intestine: Secondary | ICD-10-CM

## 2013-05-19 DIAGNOSIS — I498 Other specified cardiac arrhythmias: Secondary | ICD-10-CM | POA: Diagnosis present

## 2013-05-19 DIAGNOSIS — K589 Irritable bowel syndrome without diarrhea: Secondary | ICD-10-CM | POA: Diagnosis present

## 2013-05-19 DIAGNOSIS — K838 Other specified diseases of biliary tract: Secondary | ICD-10-CM | POA: Diagnosis present

## 2013-05-19 DIAGNOSIS — M353 Polymyalgia rheumatica: Secondary | ICD-10-CM | POA: Diagnosis present

## 2013-05-19 DIAGNOSIS — F410 Panic disorder [episodic paroxysmal anxiety] without agoraphobia: Secondary | ICD-10-CM | POA: Diagnosis present

## 2013-05-19 DIAGNOSIS — R197 Diarrhea, unspecified: Secondary | ICD-10-CM | POA: Diagnosis present

## 2013-05-19 DIAGNOSIS — Z85828 Personal history of other malignant neoplasm of skin: Secondary | ICD-10-CM

## 2013-05-19 DIAGNOSIS — Z66 Do not resuscitate: Secondary | ICD-10-CM | POA: Diagnosis present

## 2013-05-19 DIAGNOSIS — I10 Essential (primary) hypertension: Secondary | ICD-10-CM | POA: Diagnosis present

## 2013-05-19 DIAGNOSIS — K8050 Calculus of bile duct without cholangitis or cholecystitis without obstruction: Secondary | ICD-10-CM | POA: Diagnosis present

## 2013-05-19 DIAGNOSIS — M810 Age-related osteoporosis without current pathological fracture: Secondary | ICD-10-CM | POA: Diagnosis present

## 2013-05-19 DIAGNOSIS — K859 Acute pancreatitis without necrosis or infection, unspecified: Principal | ICD-10-CM | POA: Diagnosis present

## 2013-05-19 DIAGNOSIS — H9190 Unspecified hearing loss, unspecified ear: Secondary | ICD-10-CM | POA: Diagnosis present

## 2013-05-19 DIAGNOSIS — R7401 Elevation of levels of liver transaminase levels: Secondary | ICD-10-CM | POA: Diagnosis present

## 2013-05-19 DIAGNOSIS — E039 Hypothyroidism, unspecified: Secondary | ICD-10-CM | POA: Diagnosis present

## 2013-05-19 HISTORY — DX: Tinea unguium: B35.1

## 2013-05-19 HISTORY — DX: Benign prostatic hyperplasia without lower urinary tract symptoms: N40.0

## 2013-05-19 HISTORY — DX: Abdominal aortic aneurysm, without rupture, unspecified: I71.40

## 2013-05-19 HISTORY — DX: Age-related osteoporosis without current pathological fracture: M81.0

## 2013-05-19 HISTORY — DX: Obstruction of bile duct: K83.1

## 2013-05-19 HISTORY — DX: Unilateral inguinal hernia, without obstruction or gangrene, not specified as recurrent: K40.90

## 2013-05-19 HISTORY — DX: Acute pancreatitis without necrosis or infection, unspecified: K85.90

## 2013-05-19 HISTORY — DX: Other giant cell arteritis: M31.6

## 2013-05-19 HISTORY — DX: Abdominal aortic aneurysm, without rupture: I71.4

## 2013-05-19 LAB — CBC WITH DIFFERENTIAL/PLATELET
Eosinophils Absolute: 0 10*3/uL (ref 0.0–0.7)
Hemoglobin: 14.7 g/dL (ref 13.0–17.0)
Lymphs Abs: 0.7 10*3/uL (ref 0.7–4.0)
MCH: 31.3 pg (ref 26.0–34.0)
Monocytes Relative: 7 % (ref 3–12)
Neutro Abs: 8.5 10*3/uL — ABNORMAL HIGH (ref 1.7–7.7)
Neutrophils Relative %: 85 % — ABNORMAL HIGH (ref 43–77)
Platelets: 127 10*3/uL — ABNORMAL LOW (ref 150–400)
RBC: 4.69 MIL/uL (ref 4.22–5.81)
WBC: 9.9 10*3/uL (ref 4.0–10.5)

## 2013-05-19 LAB — COMPREHENSIVE METABOLIC PANEL
ALT: 134 U/L — ABNORMAL HIGH (ref 0–53)
Albumin: 3.4 g/dL — ABNORMAL LOW (ref 3.5–5.2)
Alkaline Phosphatase: 156 U/L — ABNORMAL HIGH (ref 39–117)
Chloride: 104 mEq/L (ref 96–112)
GFR calc Af Amer: 80 mL/min — ABNORMAL LOW (ref >90)
Glucose, Bld: 121 mg/dL — ABNORMAL HIGH (ref 70–99)
Potassium: 3.3 mEq/L — ABNORMAL LOW (ref 3.5–5.1)
Sodium: 141 mEq/L (ref 135–145)
Total Bilirubin: 3.5 mg/dL — ABNORMAL HIGH (ref 0.3–1.2)
Total Protein: 6.8 g/dL (ref 6.0–8.3)

## 2013-05-19 LAB — TROPONIN I
Troponin I: <0.3 ng/mL (ref <0.30)
Troponin I: <0.3 ng/mL (ref <0.30)

## 2013-05-19 MED ORDER — IOHEXOL 300 MG/ML  SOLN
100.0000 mL | Freq: Once | INTRAMUSCULAR | Status: AC | PRN
  Administered 2013-05-19: 100 mL via INTRAVENOUS

## 2013-05-19 MED ORDER — POTASSIUM CHLORIDE IN NACL 20-0.9 MEQ/L-% IV SOLN
INTRAVENOUS | Status: DC
  Administered 2013-05-19 – 2013-05-22 (×3): via INTRAVENOUS

## 2013-05-19 MED ORDER — PREDNISONE 10 MG PO TABS
5.0000 mg | ORAL_TABLET | Freq: Two times a day (BID) | ORAL | Status: DC
  Administered 2013-05-19 – 2013-05-22 (×6): 5 mg via ORAL
  Filled 2013-05-19 (×7): qty 1

## 2013-05-19 MED ORDER — MORPHINE SULFATE 4 MG/ML IJ SOLN
4.0000 mg | Freq: Once | INTRAMUSCULAR | Status: AC
  Administered 2013-05-19: 4 mg via INTRAVENOUS
  Filled 2013-05-19: qty 1

## 2013-05-19 MED ORDER — ACETAMINOPHEN 325 MG PO TABS: 650.0000 mg | ORAL_TABLET | Freq: Four times a day (QID) | ORAL | Status: DC | PRN

## 2013-05-19 MED ORDER — HYDROMORPHONE HCL PF 1 MG/ML IJ SOLN
0.5000 mg | INTRAMUSCULAR | Status: DC | PRN
Start: 2013-05-19 — End: 2013-05-22

## 2013-05-19 MED ORDER — ONDANSETRON HCL 4 MG/2ML IJ SOLN
4.0000 mg | Freq: Once | INTRAMUSCULAR | Status: AC
Start: 2013-05-19 — End: 2013-05-19
  Administered 2013-05-19: 4 mg via INTRAVENOUS
  Filled 2013-05-19: qty 2

## 2013-05-19 MED ORDER — ONDANSETRON HCL 4 MG/2ML IJ SOLN: 4.0000 mg | Freq: Four times a day (QID) | INTRAMUSCULAR | Status: DC | PRN

## 2013-05-19 MED ORDER — IOHEXOL 300 MG/ML  SOLN
50.0000 mL | Freq: Once | INTRAMUSCULAR | Status: AC | PRN
  Administered 2013-05-19: 50 mL via ORAL

## 2013-05-19 MED ORDER — BIOTENE DRY MOUTH MT LIQD
15.0000 mL | Freq: Two times a day (BID) | OROMUCOSAL | Status: DC
  Administered 2013-05-19 – 2013-05-22 (×6): 15 mL via OROMUCOSAL

## 2013-05-19 MED ORDER — ONDANSETRON HCL 4 MG PO TABS: 4.0000 mg | ORAL_TABLET | Freq: Four times a day (QID) | ORAL | Status: DC | PRN

## 2013-05-19 MED ORDER — LEVOTHYROXINE SODIUM 50 MCG PO TABS
50.0000 ug | ORAL_TABLET | Freq: Every day | ORAL | Status: DC
  Administered 2013-05-20 – 2013-05-22 (×2): 50 ug via ORAL
  Filled 2013-05-19 (×2): qty 1

## 2013-05-19 MED ORDER — SODIUM CHLORIDE 0.9 % IJ SOLN
3.0000 mL | Freq: Two times a day (BID) | INTRAMUSCULAR | Status: DC
  Administered 2013-05-21 – 2013-05-22 (×3): 3 mL via INTRAVENOUS

## 2013-05-19 MED ORDER — LISINOPRIL 10 MG PO TABS
10.0000 mg | ORAL_TABLET | Freq: Every day | ORAL | Status: DC
  Administered 2013-05-20 – 2013-05-22 (×3): 10 mg via ORAL
  Filled 2013-05-19 (×3): qty 1

## 2013-05-19 MED ORDER — MORPHINE SULFATE 2 MG/ML IJ SOLN
2.0000 mg | Freq: Once | INTRAMUSCULAR | Status: AC
  Administered 2013-05-19: 2 mg via INTRAVENOUS
  Filled 2013-05-19: qty 1

## 2013-05-19 MED ORDER — SODIUM CHLORIDE 0.9 % IV SOLN
INTRAVENOUS | Status: DC
  Administered 2013-05-19: 1000 mL via INTRAVENOUS

## 2013-05-19 MED ORDER — CHOLECALCIFEROL 10 MCG (400 UNIT) PO TABS
400.0000 [IU] | ORAL_TABLET | Freq: Two times a day (BID) | ORAL | Status: DC
  Administered 2013-05-19 – 2013-05-22 (×5): 400 [IU] via ORAL
  Filled 2013-05-19 (×5): qty 1

## 2013-05-19 MED ORDER — HYDROCODONE-ACETAMINOPHEN 5-325 MG PO TABS: 1.0000 | ORAL_TABLET | ORAL | Status: DC | PRN

## 2013-05-19 MED ORDER — FLUTICASONE PROPIONATE HFA 44 MCG/ACT IN AERO
2.0000 | INHALATION_SPRAY | Freq: Two times a day (BID) | RESPIRATORY_TRACT | Status: DC
  Administered 2013-05-19 – 2013-05-22 (×6): 2 via RESPIRATORY_TRACT
  Filled 2013-05-19: qty 10.6

## 2013-05-19 MED ORDER — ACETAMINOPHEN 650 MG RE SUPP: 650.0000 mg | Freq: Four times a day (QID) | RECTAL | Status: DC | PRN

## 2013-05-19 MED ORDER — ALUM & MAG HYDROXIDE-SIMETH 200-200-20 MG/5ML PO SUSP: 30.0000 mL | Freq: Four times a day (QID) | ORAL | Status: DC | PRN

## 2013-05-19 MED ORDER — ENOXAPARIN SODIUM 40 MG/0.4ML ~~LOC~~ SOLN
40.0000 mg | SUBCUTANEOUS | Status: DC
  Administered 2013-05-19: 40 mg via SUBCUTANEOUS
  Filled 2013-05-19: qty 0.4

## 2013-05-19 NOTE — H&P (Signed)
Patient seen and examined. Above note reviewed.  Patient has been admitted with epigastric pain, vomiting and diarrhea. Liver enzymes are elevated in an obstructive pattern. Lipase is also elevated. He is status post cholecystectomy in the past. CT of the abdomen and pelvis indicates a dilated bile duct without any clear evidence of gallstone. Will obtain ultrasound of the right upper quadrant. Request GI consultation. The patient apparently had a similar episode last year that resolved with conservative management. He had EUS done at that time by Dr. Elnoria Howard. Keep n.p.o. for now. He feels symptomatically improved since arriving to the hospital. Epigastric pain has improved. Will continue on IV fluids and supportive treatment. He does not have any fever and therefore will hold off on antibiotics. Await further input from GI. Repeat liver function tests in the morning.  MEMON,JEHANZEB

## 2013-05-19 NOTE — H&P (Signed)
[facility] History and Physical  Corey Walker VWU:[id number] DOB: 08-04-1917 DOA: 05/19/2013  Referring physician: Bruce Donath  PCP: Rudi Heap, MD   Chief Complaint: abdominal pain and diarrhea  HPI: Corey Walker is a very pleasant VERY hard of hearing 79 y.o. male with past medical history that includes hypertension, polymyalgia rheumatica, history of colon cancer status post resection and left inguinal hernia, pancreatitis with obstructive jaundice presents to the emergency room with the chief complaint of abdominal pain and diarrhea. Information is obtained from the patient in spite of his very poor hearing. He indicates he was in his usual state of health until this morning when he developed sudden sharp constant epigastric pain. He rates the pain a 10 out of 10 at its worst an 8/10 at its best. Associated symptoms include diarrhea nausea without vomiting. He indicates that he has had nothing to either eat or drink today. He reports at least 3 episodes of loose stool that he describes as "dark". He denies chest pain palpitations shortness of breath cough fever chills or recent sick contacts. He denies dysuria hematuria frequency or urgency. He denies headache visual disturbance numbness or tingling of extremities syncope or near-syncope. In the emergency room his vital signs are stable, and he was not hypoxic. Basic metabolic panel significant for potassium of 3.3 out phosphate 156, lipase 454, AST 165, total bilirubin 3.5. Lactic acid was 2.5. CT scan results pending at the time of my exam. We are asked to admit  Review of Systems:  10 point review of systems completed and all systems are negative except as indicated by history of present illness the    Past Medical History  Diagnosis Date  . Hypertension   . Polymyalgia rheumatica   . Diverticulosis   . Thyroid disease   . Diarrhea   . Hypokalemia   . Colon cancer   . Skin cancer   . AAA (abdominal aortic aneurysm)   .  Osteoporosis   . BPH (benign prostatic hyperplasia)   . Tinea unguium   . Temporal arteritis   . Obstructive jaundice     2013  . Pancreatitis     2013   Past Surgical History  Procedure Laterality Date  . Colon surgery    . Eus  07/13/2011    Procedure: UPPER ENDOSCOPIC ULTRASOUND (EUS) LINEAR;  Surgeon: Theda Belfast, MD;  Location: [facility];  Service: Endoscopy;  Laterality: N/A;   Social History:  reports that he has never smoked. He does not have any smokeless tobacco history on file. He reports that he does not drink alcohol or use illicit drugs. He is a resident of [place] and. He indicates that he should use a cane when he walks but he doesn't always. He reports he has not fallen for 2 years. He is not completely independent with ADLs Allergies  Allergen Reactions  . Darvocet [Propoxyphene N-Acetaminophen]     Propoxyphene (Per MAR)  . Fish Oil   . Lactose Intolerance (Gi)   . Loratadine [Loratadine] Other (See Comments)    dizziness (PER MAR)  . Penicillins Swelling    Swelling of hands (per [place] records)    History reviewed. No pertinent family history.   Prior to Admission medications   Medication Sig Start Date End Date Taking? Authorizing Provider  anti-nausea (EMETROL) solution Take 5 mLs by mouth every 15 (fifteen) minutes as needed. As needed to stop emesis   Yes Historical Provider, MD  Budesonide (  PULMICORT FLEXHALER) 90 MCG/ACT inhaler Inhale 1 puff into the lungs 2 (two) times daily.   Yes Historical Provider, MD  cholecalciferol (VITAMIN D) 400 UNITS TABS Take 400 Units by mouth 2 (two) times daily.   Yes Historical Provider, MD  cyanocobalamin (,VITAMIN B-12,) 1000 MCG/ML injection Inject 1,000 mcg into the muscle every 30 (thirty) days.   Yes Historical Provider, MD  ferrous sulfate 325 (65 FE) MG tablet Take 325 mg by mouth daily with breakfast.   Yes Historical Provider, MD  furosemide (LASIX) 20 MG tablet Take 20 mg by mouth every  other day.    Yes Historical Provider, MD  levothyroxine (SYNTHROID, LEVOTHROID) 50 MCG tablet Take 50 mcg by mouth daily.   Yes Historical Provider, MD  lisinopril (PRINIVIL,ZESTRIL) 10 MG tablet Take 10 mg by mouth daily.   Yes Historical Provider, MD  predniSONE (DELTASONE) 5 MG tablet Take 5 mg by mouth 2 (two) times daily.   Yes Historical Provider, MD  promethazine (PHENERGAN) 12.5 MG tablet Take 12.5 mg by mouth every 8 (eight) hours as needed for nausea.   Yes Historical Provider, MD   Physical Exam: Filed Vitals:   05/19/13 1438  BP: 120/74  Pulse: 63  Temp:   Resp: 18    BP 120/74  Pulse 63  Temp(Src) 97.7 F (36.5 C) (Oral)  Resp 18  Ht 5\' 7"  (1.702 m)  Wt 54.432 kg (120 lb)  BMI 18.79 kg/m2  SpO2 97%  General:  Appears calm and comfortable, thin and frail Eyes: PERRL, normal lids, irises & conjunctiva ENT: Ears are clear nose without drainage oropharynx without erythema or exudate. Mucous membranes of his mouth are pink but dry Neck: no LAD, masses or thyromegaly full range of motion Cardiovascular: Regular rate and rhythm no murmur no gallop no rub no lower extremity edema  Respiratory: Somewhat shallow, CTA bilaterally, no w/r/r. Normal respiratory effort. Abdomen: Nondistended soft positive bowel sounds moderate amount of tenderness to palpation in the epigastric area and left upper quadrant. No guarding no rebound Skin: no rash or induration seen on limited exam, his skin is very thin several healing bruises on all extremities. No rashes or lesions noted Musculoskeletal: Poor to no muscle tone  Psychiatric: grossly normal mood and affect, speech fluent and appropriate Neurologic: grossly non-focal.          Labs on Admission:  Basic Metabolic Panel:  Recent Labs Lab 05/19/13 1300  NA 141  K 3.3*  CL 104  CO2 24  GLUCOSE 121*  BUN 23  CREATININE 0.96  CALCIUM 9.3   Liver Function Tests:  Recent Labs Lab 05/19/13 1300  AST 165*  ALT 134*   ALKPHOS 156*  BILITOT 3.5*  PROT 6.8  ALBUMIN 3.4*    Recent Labs Lab 05/19/13 1300  LIPASE 454*   No results found for this basename: AMMONIA,  in the last 168 hours CBC:  Recent Labs Lab 05/19/13 1300  WBC 9.9  NEUTROABS 8.5*  HGB 14.7  HCT 44.0  MCV 93.8  PLT 127*   Cardiac Enzymes:  Recent Labs Lab 05/19/13 1300  TROPONINI <0.30    BNP (last 3 results) No results found for this basename: PROBNP,  in the last 8760 hours CBG: No results found for this basename: GLUCAP,  in the last 168 hours  Radiological Exams on Admission: Dg Chest Portable 1 View  05/19/2013   CLINICAL DATA:  Chest pain  EXAM: PORTABLE CHEST - 1 VIEW  COMPARISON:  November 01, 2012  FINDINGS: There is no edema or consolidation. There appears to be a degree of underlying emphysematous change. There is stable apical pleural thickening bilaterally. Heart size is normal. Pulmonary vascularity is within normal limits. No adenopathy. There is atherosclerotic change in the aorta. No pneumothorax. No bone lesions.  IMPRESSION: Evidence of a degree of underlying emphysematous change. Stable apical pleural thickening bilaterally. No edema or consolidation.   Electronically Signed   By: Bretta Bang M.D.   On: 05/19/2013 13:42    EKG: Independently reviewed A. fib with competing junctional pacemaker with  Assessment/Plan Principal Problem:   Pancreatitis: History of same last year. Patient had a CT of the abdomen last year did not show any stone in the pancreatic duct dilatation. Present at that time that he passed a stone. He underwent EUS CBG noted to be dilated to 9 mm without any stones mass or sludge. At the time of admission we're waiting results of CT of the abdomen and pelvis. If any thickening, dilatation or sludge will request GI consult. In the meantime we'll provide supportive therapy by making the patient n.p.o. providing vigorous IV fluids for hydration pain management and nausea  medicine.  Active Problems: Obstructive jaundice: See #1.  Transaminitis: Related to #1. Await results of CT. Provide IV fluids and recheck levels in the a.m.    Hypokalemia: Secondary to #1. Will replete via his IV fluids. Will recheck in the a.m.    Diarrhea: Patient denies any recent antibiotic use. Denies any NSAID use. Will check FOBT. Hemoglobin is stable. Also check C. difficile and stool culture. Of note daughter reports he has had loose stool since 2001 and "he always complains about diarrhea".     HTN (hypertension): Stable in the emergency department. His home medications include Lasix which I will hold and lisinopril which I will resume. Monitor closely    Polymyalgia rheumatica: Patient on prednisone 5 mg twice a day. Will continue this for now.    Hypothyroidism: Will check a TSH and continue his Synthroid     AAA (abdominal aortic aneurysm): Appears stable at baseline. Await results of CT. Vital signs are stable.     Code Status: DNR Family Communication: daughter Marcelino Duster on phone Disposition Plan: back to facility  Time spent: 65 minutes  [facility] Pager [phone]

## 2013-05-19 NOTE — ED Notes (Signed)
Patient brought in via EMS. Patient from [facility] of [address]. Patient alert and oriented. Patient c/o epigastric pain with diarrhea. No fevers noted.

## 2013-05-19 NOTE — ED Provider Notes (Signed)
CSN: [id number]     Arrival date & time 05/19/13  1149 History   This chart was scribed for Toy Baker, MD, by Yevette Edwards, ED Scribe. This patient was seen in room APA09/APA09 and the patient's care was started at 1:12 PM.  First MD Initiated Contact with Patient 05/19/13 1308     Chief Complaint  Patient presents with  . Abdominal Pain  . Diarrhea   LEVEL FIVE CAVEAT: Poor Historian   The history is provided by the patient. No language interpreter was used.   HPI Comments: Corey Walker is a 79 y.o. male, with a h/o colon cancer, who presents to the Emergency Department complaining of "dark" diarrhea and associated constant epigastric pain which have been occurring approximately six hours.  The pt denies a fever, emesis, chest pain, SOB, or a cough. He reports a h/o similar symptoms, though he states he usually experiences lower quadrant abdominal pain. The pt is a non-smoker.    Past Medical History  Diagnosis Date  . Hypertension   . Polymyalgia rheumatica   . Diverticulosis   . Thyroid disease   . Diarrhea   . Hypokalemia   . Colon cancer   . Skin cancer   . AAA (abdominal aortic aneurysm)   . Osteoporosis   . BPH (benign prostatic hyperplasia)   . Tinea unguium   . Temporal arteritis    Past Surgical History  Procedure Laterality Date  . Colon surgery    . Eus  07/13/2011    Procedure: UPPER ENDOSCOPIC ULTRASOUND (EUS) LINEAR;  Surgeon: Theda Belfast, MD;  Location: [facility];  Service: Endoscopy;  Laterality: N/A;   History reviewed. No pertinent family history. History  Substance Use Topics  . Smoking status: Never Smoker   . Smokeless tobacco: Not on file  . Alcohol Use: No    Review of Systems  Unable to perform ROS: Other  All other systems reviewed and are negative.   Allergies  Darvocet; Fish oil; Lactose intolerance (gi); Loratadine; and Penicillins  Home Medications   Current Outpatient Rx  Name  Route  Sig  Dispense  Refill  .  acetaminophen (TYLENOL) 500 MG tablet   Oral   Take 500 mg by mouth every 6 (six) hours as needed. For pain/fever         . aluminum & magnesium hydroxide (MAALOX) 225-200 MG/5ML suspension   Oral   Take 30 mLs by mouth every 6 (six) hours as needed. For indigestion         . anti-nausea (EMETROL) solution   Oral   Take 5 mLs by mouth every 15 (fifteen) minutes as needed. As needed to stop emesis         . Budesonide (PULMICORT FLEXHALER) 90 MCG/ACT inhaler   Inhalation   Inhale 1 puff into the lungs 2 (two) times daily.         . cholecalciferol (VITAMIN D) 400 UNITS TABS   Oral   Take 400 Units by mouth 2 (two) times daily.         . cyanocobalamin (,VITAMIN B-12,) 1000 MCG/ML injection   Intramuscular   Inject 1,000 mcg into the muscle every 30 (thirty) days.         . diphenhydrAMINE (BENADRYL) 25 MG tablet   Oral   Take 25 mg by mouth every 4 (four) hours as needed. For allergies/itching         . ferrous sulfate 325 (65 FE) MG tablet  Oral   Take 325 mg by mouth daily with breakfast.         . furosemide (LASIX) 20 MG tablet   Oral   Take 20 mg by mouth every other day.          Marland Kitchen guaiFENesin (ROBITUSSIN) 100 MG/5ML liquid   Oral   Take 200 mg by mouth 3 (three) times daily as needed. For cough         . levothyroxine (SYNTHROID, LEVOTHROID) 50 MCG tablet   Oral   Take 50 mcg by mouth daily.         . predniSONE (DELTASONE) 5 MG tablet   Oral   Take 5 mg by mouth 2 (two) times daily.          Triage Vitals: BP 112/63  Pulse 61  Temp(Src) 97.7 F (36.5 C) (Oral)  Resp 16  Ht 5\' 7"  (1.702 m)  Wt 120 lb (54.432 kg)  BMI 18.79 kg/m2  SpO2 98%  Physical Exam  Nursing note and vitals reviewed. Constitutional: He is oriented to person, place, and time. No distress.  HENT:  Head: Normocephalic and atraumatic.  Eyes: Conjunctivae and EOM are normal. Pupils are equal, round, and reactive to light. No scleral icterus.  Neck: Normal  range of motion. Neck supple. No tracheal deviation present. No thyromegaly present.  Cardiovascular: Normal rate, regular rhythm, normal heart sounds and intact distal pulses.  Exam reveals no gallop and no friction rub.   No murmur heard. Pulmonary/Chest: Effort normal and breath sounds normal. No respiratory distress. He has no wheezes. He has no rales.  Abdominal: Soft. Bowel sounds are normal. He exhibits no distension. There is tenderness. There is no rebound and no guarding.  Mild pain to epigastric area.   Musculoskeletal: Normal range of motion.  Neurological: He is alert and oriented to person, place, and time.  Skin: Skin is warm and dry. No rash noted.  Psychiatric: He has a normal mood and affect. His behavior is normal.    ED Course  Procedures (including critical care time)  DIAGNOSTIC STUDIES: Oxygen Saturation is 98% on room air, normal by my interpretation.    COORDINATION OF CARE:  1:18 PM- Discussed treatment plan with patient, and the patient agreed to the plan.   Labs Review Labs Reviewed  CBC WITH DIFFERENTIAL - Abnormal; Notable for the following:    Platelets 127 (*)    Neutrophils Relative % 85 (*)    Neutro Abs 8.5 (*)    Lymphocytes Relative 8 (*)    All other components within normal limits  COMPREHENSIVE METABOLIC PANEL - Abnormal; Notable for the following:    Potassium 3.3 (*)    Glucose, Bld 121 (*)    Albumin 3.4 (*)    AST 165 (*)    ALT 134 (*)    Alkaline Phosphatase 156 (*)    Total Bilirubin 3.5 (*)    GFR calc non Af Amer 69 (*)    GFR calc Af Amer 80 (*)    All other components within normal limits  TROPONIN I  LIPASE, BLOOD   Imaging Review No results found.  EKG Interpretation    Date/Time:  Tuesday May 19 2013 11:55:50 EST Ventricular Rate:  66 PR Interval:    QRS Duration: 78 QT Interval:  428 QTC Calculation: 448 R Axis:   81 Text Interpretation:  Nonspecific ST abnormality Abnormal ECG When compared with ECG  of 08-Aug-2008 17:47, Atrial fibrillation has replaced Sinus rhythm  Right bundle branch block is no longer Present Confirmed by Ronnett Pullin  MD, Abrielle Finck ([id number]) on 05/19/2013 1:50:16 PM            MDM  No diagnosis found. Pt given morphine for pain and will be admitted for pancreatitis  I personally performed the services described in this documentation, which was scribed in my presence. The recorded information has been reviewed and is accurate.     Toy Baker, MD 05/19/13 [phone]

## 2013-05-20 ENCOUNTER — Encounter (HOSPITAL_COMMUNITY): Payer: Self-pay | Admitting: Gastroenterology

## 2013-05-20 ENCOUNTER — Inpatient Hospital Stay (HOSPITAL_COMMUNITY): Payer: Medicare Other

## 2013-05-20 DIAGNOSIS — K838 Other specified diseases of biliary tract: Secondary | ICD-10-CM

## 2013-05-20 DIAGNOSIS — K859 Acute pancreatitis without necrosis or infection, unspecified: Secondary | ICD-10-CM

## 2013-05-20 DIAGNOSIS — I10 Essential (primary) hypertension: Secondary | ICD-10-CM

## 2013-05-20 LAB — CBC
HCT: 43.3 % (ref 39.0–52.0)
Hemoglobin: 14.4 g/dL (ref 13.0–17.0)
MCH: 31.5 pg (ref 26.0–34.0)
WBC: 5.9 10*3/uL (ref 4.0–10.5)

## 2013-05-20 LAB — COMPREHENSIVE METABOLIC PANEL
BUN: 18 mg/dL (ref 6–23)
CO2: 21 mEq/L (ref 19–32)
Calcium: 8.6 mg/dL (ref 8.4–10.5)
Chloride: 108 mEq/L (ref 96–112)
GFR calc Af Amer: 82 mL/min — ABNORMAL LOW (ref >90)
Glucose, Bld: 79 mg/dL (ref 70–99)
Sodium: 142 mEq/L (ref 135–145)
Total Bilirubin: 5.5 mg/dL — ABNORMAL HIGH (ref 0.3–1.2)
Total Protein: 6.1 g/dL (ref 6.0–8.3)

## 2013-05-20 NOTE — Care Management Note (Addendum)
    Page 1 of 1   05/22/2013     1:33:41 PM   CARE MANAGEMENT NOTE 05/22/2013  Patient:  Corey Walker, Corey Walker   Account Number:  [id number]  Date Initiated:  05/20/2013  Documentation initiated by:  Sharrie Rothman  Subjective/Objective Assessment:   Pt admitted from [facility]. Pt will return to facility at discharge.     Action/Plan:   CSW to arrange discharge to facility when medically stable.   Anticipated DC Date:  05/22/2013   Anticipated DC Plan:  ASSISTED LIVING / REST HOME  In-house referral  Clinical Social Worker      DC Planning Services  CM consult      Choice offered to / List presented to:             Status of service:  Completed, signed off Medicare Important Message given?  YES (If response is "NO", the following Medicare IM given date fields will be blank) Date Medicare IM given:  05/22/2013 Date Additional Medicare IM given:    Discharge Disposition:  ASSISTED LIVING  Per UR Regulation:    If discussed at Long Length of Stay Meetings, dates discussed:    Comments:  05/22/13 1330 Arlyss Queen, RN BSN CM Pt discharged back to [facility]. CSW to arrange discharge to facility. 05/20/13 1000 Arlyss Queen, RN BSN CM

## 2013-05-20 NOTE — Progress Notes (Signed)
Patient seen and examined; imaging studies reviewed. Please see full consultation note for details. Patient presents with mild biliary pancreatitis which is improving. I suspect extrahepatic biliary obstruction secondary to a common duct stone producing pancreatitis rather than an underlying stricture/neoplastic process.  He would benefit from an ERCP with biliary decompression as needed. The risks, benefits, limitations, alternatives, and mponderable have been reviewed with the patient. I specifically discussed a1 in 10 chance of pancreatitis, reaction to medications, bleeding, perforation and the possibility of a failed ERCP. Potential for sphincterotomy and stent placement also reviewed. Questions have been answered. Patient is agreeable. Procedure also to be discussed with power of attorney. Planned for ERCP with Dr. Karilyn Cota tomorrow; Lovenox held. Recheck LFTs tomorrow morning.

## 2013-05-20 NOTE — Progress Notes (Signed)
Patient is experiencing asymptomatic bradycardia with HR 44-48 bpm..  Took a set of vital signs and paged the on-call physician. Will follow orders and continue to monitor the patient.

## 2013-05-20 NOTE — Progress Notes (Signed)
UR chart review completed.  

## 2013-05-20 NOTE — Progress Notes (Signed)
[facility] PROGRESS NOTE  Corey Walker ZOX:[id number] DOB: [birth year] DOA: 05/19/2013 PCP: Rudi Heap, MD  Assessment/Plan: 1. Biliary Pancreatitis. Patient to have dilated common bile duct and elevated liver function test. Concern for CBD stone, although this was not visualized on CT or ultrasound. Lipase has normalized, but liver enzymes remain elevated. Patient has been seen by GI and has been offered an ERCP for tomorrow. 2. Obstructive jaundice, see #1. 3. Hypokalemia. Improve 4. Diarrhea, does not appear significant. 5. Hypertension. Stable 6. Polymyalgia rheumatica on chronic prednisone. Continue for now. 7. Abdominal aortic aneurysm. It appears stable. 8. Sinus bradycardia. Patient is asymptomatic. This occurred while patient was sleeping. Would not suggest further evaluation unless patient becomes symptomatic.  Code Status: DNR Family Communication: discussed with patient, no family present Disposition Plan: discharge back to [facility] once improved   Consultants:  gastroenterology  Procedures:  none  Antibiotics:  none  HPI/Subjective: Feeling better, no vomiting, abdominal pain has improved.  Objective: Filed Vitals:   05/20/13 1433  BP: 136/67  Pulse: 61  Temp: 97.7 F (36.5 C)  Resp: 18    Intake/Output Summary (Last 24 hours) at 05/20/13 1607 Last data filed at 05/20/13 1434  Gross per 24 hour  Intake   1475 ml  Output    425 ml  Net   1050 ml   Filed Weights   05/19/13 1208 05/19/13 1537  Weight: 54.432 kg (120 lb) 55.5 kg (122 lb 5.7 oz)    Exam:   General:  NAD  Cardiovascular: S1, S2 RRR  Respiratory: CTA B  Abdomen: soft, nt, nd, bs+  Musculoskeletal: no edema b/l   Data Reviewed: Basic Metabolic Panel:  Recent Labs Lab 05/19/13 1300 05/20/13 0929  NA 141 142  K 3.3* 4.6  CL 104 108  CO2 24 21  GLUCOSE 121* 79  BUN 23 18  CREATININE 0.96 0.91  CALCIUM 9.3 8.6   Liver Function Tests:  Recent  Labs Lab 05/19/13 1300 05/20/13 0929  AST 165* 128*  ALT 134* 150*  ALKPHOS 156* 217*  BILITOT 3.5* 5.5*  PROT 6.8 6.1  ALBUMIN 3.4* 2.9*    Recent Labs Lab 05/19/13 1300 05/20/13 0929  LIPASE 454* 56   No results found for this basename: AMMONIA,  in the last 168 hours CBC:  Recent Labs Lab 05/19/13 1300 05/20/13 0929  WBC 9.9 5.9  NEUTROABS 8.5*  --   HGB 14.7 14.4  HCT 44.0 43.3  MCV 93.8 94.7  PLT 127* 140*   Cardiac Enzymes:  Recent Labs Lab 05/19/13 1300 05/19/13 2018  TROPONINI <0.30 <0.30   BNP (last 3 results) No results found for this basename: PROBNP,  in the last 8760 hours CBG: No results found for this basename: GLUCAP,  in the last 168 hours  Recent Results (from the past 240 hour(s))  MRSA PCR SCREENING     Status: None   Collection Time    05/19/13  4:43 PM      Result Value Range Status   MRSA by PCR NEGATIVE  NEGATIVE Final   Comment:            The GeneXpert MRSA Assay (FDA     approved for NASAL specimens     only), is one component of a     comprehensive MRSA colonization     surveillance program. It is not     intended to diagnose MRSA     infection nor to guide or  monitor treatment for     MRSA infections.     Studies: Ct Abdomen Pelvis W Contrast  05/19/2013   CLINICAL DATA:  Abdominal pain  EXAM: CT ABDOMEN AND PELVIS WITH CONTRAST  TECHNIQUE: Multidetector CT imaging of the abdomen and pelvis was performed using the standard protocol following bolus administration of intravenous contrast.  CONTRAST:  50mL OMNIPAQUE IOHEXOL 300 MG/ML SOLN, OMNIPAQUE IOHEXOL 300 MG/ML SOLN  COMPARISON:  CT 03/10/2013  FINDINGS: Marked biliary dilatation has developed since the prior study. Common bile duct measures 13 mm. There is intrahepatic biliary duct dilatation. This is most likely due to a common duct stone although I do not detect a definite stone or mass on today's study. Prior cholecystectomy. Bile ducts were not dilated on  the prior study.  No focal liver mass lesion. Spleen is normal in size. Pancreas is atrophic without mass or edema.  3 mm right lower pole stone without renal obstruction. 6.7 cm cyst left kidney is stable. This has water density but does contain a small amount of calcification in the wall medially, unchanged from the prior study.  Abdominal aortic aneurysm measures 4.6 x 4.9 cm and is unchanged from the prior study. There is a large amount of mural thrombus. No evidence of aneurysm leak. Atherosclerotic iliac arteries without dilatation.  Sigmoid diverticulosis without evidence of acute diverticulitis. Surgical clips in the cecal region.  Large left inguinal hernia containing multiple loops of small bowel and omentum. No bowel obstruction is identified. Hernia is similar to the prior study.  Prostate is enlarged.  No adenopathy is present.  IMPRESSION: Marked biliary duct dilatation with common bile duct measuring 13 mm. Suspect underlying obstruction due to common duct stone. No mass is identified causing obstruction.  Abdominal aortic aneurysm measuring 4.6 x 4.9 cm without evidence of leak.  Large left inguinal hernia without bowel obstruction.  Sigmoid diverticulosis without acute inflammation.   Electronically Signed   By: Marlan Palau M.D.   On: 05/19/2013 17:33   Dg Chest Portable 1 View  05/19/2013   CLINICAL DATA:  Chest pain  EXAM: PORTABLE CHEST - 1 VIEW  COMPARISON:  November 01, 2012  FINDINGS: There is no edema or consolidation. There appears to be a degree of underlying emphysematous change. There is stable apical pleural thickening bilaterally. Heart size is normal. Pulmonary vascularity is within normal limits. No adenopathy. There is atherosclerotic change in the aorta. No pneumothorax. No bone lesions.  IMPRESSION: Evidence of a degree of underlying emphysematous change. Stable apical pleural thickening bilaterally. No edema or consolidation.   Electronically Signed   By: Bretta Bang M.D.    On: 05/19/2013 13:42   US Abdomen Limited Ruq  05/20/2013   CLINICAL DATA:  Elevated LFTs.  EXAM: US ABDOMEN LIMITED - RIGHT UPPER QUADRANT  COMPARISON:  None.  FINDINGS: Gallbladder  Prior cholecystectomy  Common bile duct  Diameter: Dilated, 14 mm in diameter. This could be related to patient's age and post cholecystectomy state. No visible obstructing stone or mass. The bile duct within the pancreatic is obscured by bowel gas. Recommend correlation with bilirubin level.  Liver:  Mild intrahepatic biliary ductal dilatation and. No focal lesion. Normal echotexture.  IMPRESSION: Cholecystectomy.  Dilated intrahepatic and extrahepatic biliary ducts without visible obstructing stone or process (the distal duct in the pancreatic head is obscured by bowel gas). This could be related to post cholecystectomy state and patient's age. Recommend correlation with bilirubin levels.   Electronically Signed  By: Charlett Nose M.D.   On: 05/20/2013 09:17    Scheduled Meds: . antiseptic oral rinse  15 mL Mouth Rinse BID  . cholecalciferol  400 Units Oral BID  . fluticasone  2 puff Inhalation BID  . levothyroxine  50 mcg Oral QAC breakfast  . lisinopril  10 mg Oral Daily  . predniSONE  5 mg Oral BID  . sodium chloride  3 mL Intravenous Q12H   Continuous Infusions: . 0.9 % NaCl with KCl 20 mEq / L 100 mL/hr at 05/19/13 1542    Principal Problem:   Pancreatitis Active Problems:   Obstructive jaundice   HTN (hypertension)   Polymyalgia rheumatica   Hypothyroidism   AAA (abdominal aortic aneurysm)   Transaminitis   Hypokalemia   Diarrhea    Time spent:    MEMON,JEHANZEB  [facility] Pager [phone]. If 7PM-7AM, please contact night-coverage at [email or web address], password [facility] 05/20/2013, 4:07 PM  LOS: 1 day

## 2013-05-20 NOTE — Clinical Social Work Psychosocial (Signed)
Clinical Social Work Department BRIEF PSYCHOSOCIAL ASSESSMENT 05/20/2013  Patient:  Corey Walker, Corey Walker     Account Number:  [id number]     Admit date:  05/19/2013  Clinical Social Worker:  Nancie Neas  Date/Time:  05/20/2013 09:15 AM  Referred by:  CSW  Date Referred:  05/20/2013 Referred for  ALF Placement   Other Referral:   Interview type:  Patient Other interview type:    PSYCHOSOCIAL DATA Living Status:  FACILITY Admitted from facility:  OTHER Level of care:  Assisted Living Primary support name:  Marcelino Duster Primary support relationship to patient:  CHILD, ADULT Degree of support available:   supportive    CURRENT CONCERNS Current Concerns  Post-Acute Placement   Other Concerns:    SOCIAL WORK ASSESSMENT / PLAN CSW met with pt at bedside. Pt alert and oriented and very pleasant. He has been a resident at [facility] of [address] since 2010. Pt has one daughter who lives in [place]. She is as involved as she can be as she is out of town. Daughter visits when able. Per Neysa Bonito at facility, pt is fairly independent and uses a rollator for ambulating. No home health prior to admission, but facility uses [facility] if needed at d/c. Okay to return. Pt reports he went to [facility] from home as his daughter did not feel comfortable with him living at home alone. His wife died 17 years ago. Pt agreeable to return to [facility].   Assessment/plan status:  Psychosocial Support/Ongoing Assessment of Needs Other assessment/ plan:   Information/referral to community resources:   [facility]    PATIENT'S/FAMILY'S RESPONSE TO PLAN OF CARE: Pt indicates he still has some difficulty not living at home, but is fairly positive regarding return to [facility] when medically stable.       Derenda Fennel, [place] [phone]

## 2013-05-20 NOTE — Consult Note (Signed)
Referring Provider: Erick Blinks, MD Primary Care Physician:  Rudi Heap, MD Primary Gastroenterologist:  ?  Reason for Consultation:  Elevated LFTs  HPI: Corey Walker is a 79 y.o. male past medical history for hypertension, polymyalgia rheumatica, history of colon cancer status post resection (2001), pancreatitis with obstructive jaundice who presented to emergency department with abdominal pain, nausea, diarrhea. He is very hard of hearing but is able to provide history.  The patient was admitted back in February 2013 with obstructive jaundice. He was seen by Dr. Jeani Hawking at that time. It was felt that he likely had gallstone pancreatitis. He underwent endoscopic ultrasound which was limited due to inadequate conscious sedation. Dr. Elnoria Howard felt that the common bile duct was adequately imaged and there was no evidence of choledocholithiasis. Grossly the pancreatic parenchyma appeared unremarkable but again somewhat limited exam. CT of the pancreas at that time was unremarkable except for diffuse pancreatic fatty atrophy.  CT of the abdomen and pelvis with contrast last night showed new marked biliary dilatation with common bile duct measuring 13 mm. Intrahepatic biliary dilatation as well. No definitive stones seen in the common bile duct on the CT scan. Abdominal aortic aneurysm measuring 4.6 x 4.9 cm, large amount of mural thrombus but no evidence of leak. Large left inguinal hernia containing multiple loops of small bowel and omentum similar to prior study.  Abdominal ultrasound today showed CBD 14 mm. No visible stone. Mild intrahepatic biliary ductal dilation. The distal duct in the pancreatic head was obscured by bowel gas.  Last documented normal LFTs were in June 2014. He had abnormal LFTs back in October, actually alkaline phosphatase, AST, ALT higher than now but bilirubin was only 2.4 then.  Lipase on admission 454, now normal.  Patient states his pain started day of  admission. Acute onset RUQ pain associated with nausea. Reports chronic intermittent diarrhea which he is most fixated on. States he has has recent melena. He is on "something for gas" and iron daily. No brbpr. No heartburn, vomiting, fever. Generally appetite is ok but he avoids certain foods such as red meat due to "tears my stomach up." Denies fever or chills. Weight has been stable. Feels better today. He is not sure when his last colonoscopy was.   Prior to Admission medications   Medication Sig Start Date End Date Taking? Authorizing Provider  anti-nausea (EMETROL) solution Take 5 mLs by mouth every 15 (fifteen) minutes as needed. As needed to stop emesis   Yes Historical Provider, MD  Budesonide (PULMICORT FLEXHALER) 90 MCG/ACT inhaler Inhale 1 puff into the lungs 2 (two) times daily.   Yes Historical Provider, MD  cholecalciferol (VITAMIN D) 400 UNITS TABS Take 400 Units by mouth 2 (two) times daily.   Yes Historical Provider, MD  cyanocobalamin (,VITAMIN B-12,) 1000 MCG/ML injection Inject 1,000 mcg into the muscle every 30 (thirty) days.   Yes Historical Provider, MD  ferrous sulfate 325 (65 FE) MG tablet Take 325 mg by mouth daily with breakfast.   Yes Historical Provider, MD  furosemide (LASIX) 20 MG tablet Take 20 mg by mouth every other day.    Yes Historical Provider, MD  levothyroxine (SYNTHROID, LEVOTHROID) 50 MCG tablet Take 50 mcg by mouth daily.   Yes Historical Provider, MD  lisinopril (PRINIVIL,ZESTRIL) 10 MG tablet Take 10 mg by mouth daily.   Yes Historical Provider, MD  predniSONE (DELTASONE) 5 MG tablet Take 5 mg by mouth 2 (two) times daily.   Yes Historical Provider, MD  promethazine (PHENERGAN) 12.5 MG tablet Take 12.5 mg by mouth every 8 (eight) hours as needed for nausea.   Yes Historical Provider, MD    Current Facility-Administered Medications  Medication Dose Route Frequency Provider Last Rate Last Dose  . 0.9 % NaCl with KCl 20 mEq/ L  infusion   Intravenous  Continuous Gwenyth Bender, NP 100 mL/hr at 05/19/13 1542    . acetaminophen (TYLENOL) tablet 650 mg  650 mg Oral Q6H PRN Gwenyth Bender, NP       Or  . acetaminophen (TYLENOL) suppository 650 mg  650 mg Rectal Q6H PRN Gwenyth Bender, NP      . alum & mag hydroxide-simeth (MAALOX/MYLANTA) 200-200-20 MG/5ML suspension 30 mL  30 mL Oral Q6H PRN Gwenyth Bender, NP      . antiseptic oral rinse (BIOTENE) solution 15 mL  15 mL Mouth Rinse BID Erick Blinks, MD   15 mL at 05/20/13 0800  . cholecalciferol (VITAMIN D) tablet 400 Units  400 Units Oral BID Gwenyth Bender, NP   400 Units at 05/20/13 0759  . enoxaparin (LOVENOX) injection 40 mg  40 mg Subcutaneous Q24H Gwenyth Bender, NP   40 mg at 05/19/13 1643  . fluticasone (FLOVENT HFA) 44 MCG/ACT inhaler 2 puff  2 puff Inhalation BID Gwenyth Bender, NP   2 puff at 05/20/13 0815  . HYDROcodone-acetaminophen (NORCO/VICODIN) 5-325 MG per tablet 1-2 tablet  1-2 tablet Oral Q4H PRN Gwenyth Bender, NP      . HYDROmorphone (DILAUDID) injection 0.5 mg  0.5 mg Intravenous Q2H PRN Lesle Chris Black, NP      . levothyroxine (SYNTHROID, LEVOTHROID) tablet 50 mcg  50 mcg Oral QAC breakfast Gwenyth Bender, NP   50 mcg at 05/20/13 0800  . lisinopril (PRINIVIL,ZESTRIL) tablet 10 mg  10 mg Oral Daily Lesle Chris Black, NP   10 mg at 05/20/13 0800  . ondansetron (ZOFRAN) tablet 4 mg  4 mg Oral Q6H PRN Gwenyth Bender, NP       Or  . ondansetron [facility]) injection 4 mg  4 mg Intravenous Q6H PRN Lesle Chris Black, NP      . predniSONE (DELTASONE) tablet 5 mg  5 mg Oral BID Gwenyth Bender, NP   5 mg at 05/20/13 0759  . sodium chloride 0.9 % injection 3 mL  3 mL Intravenous Q12H Gwenyth Bender, NP        Allergies as of 05/19/2013 - Review Complete 05/19/2013  Allergen Reaction Noted  . Darvocet [propoxyphene n-acetaminophen]  07/12/2011  . Fish oil  11/01/2012  . Lactose intolerance (gi)  11/01/2012  . Loratadine [loratadine] Other (See Comments) 07/12/2011  . Penicillins Swelling 07/12/2011     Past Medical History  Diagnosis Date  . Hypertension   . Polymyalgia rheumatica   . Diverticulosis   . Thyroid disease   . Diarrhea   . Hypokalemia   . Colon cancer 2001  . Skin cancer   . AAA (abdominal aortic aneurysm)     4.6 X 4.9 cm  . Osteoporosis   . BPH (benign prostatic hyperplasia)   . Tinea unguium   . Temporal arteritis   . Obstructive jaundice     2013  . Pancreatitis     2013, 04/2013  . Inguinal hernia, left     Past Surgical History  Procedure Laterality Date  . Colon surgery  2001    right colectomy for colon cancer  . Eus  07/13/2011    Dr. Jeani Hawking, inadequate conscious sedation. limited views of pancreas. No evidence of choledocholithiasis, CBD 9mm.  . Cholecystectomy      History reviewed. No pertinent family history.  History   Social History  . Marital Status: Widowed    Spouse Name: N/A    Number of Children: N/A  . Years of Education: N/A   Occupational History  . Not on file.   Social History Main Topics  . Smoking status: Never Smoker   . Smokeless tobacco: Not on file  . Alcohol Use: No  . Drug Use: No  . Sexual Activity: Not on file   Other Topics Concern  . Not on file   Social History Narrative  . No narrative on file     ROS:  General: Negative for anorexia, weight loss, fever, chills, fatigue, weakness.weight stable since 06/2011 at 120-125 pounds.  Eyes: Negative for vision changes.  ENT: Negative for hoarseness, difficulty swallowing , nasal congestion.hard of hearing CV: Negative for chest pain, angina, palpitations, dyspnea on exertion, peripheral edema.  Respiratory: Negative for dyspnea at rest, dyspnea on exertion, cough, sputum, wheezing.  GI: See history of present illness. GU:  Negative for dysuria, hematuria, urinary incontinence, urinary frequency, nocturnal urination.  MS: Negative for joint pain, low back pain.  Derm: Negative for rash or itching.  Neuro: Negative for weakness, abnormal  sensation, seizure, frequent headaches, memory loss, confusion.  Psych: Negative for anxiety, depression, suicidal ideation, hallucinations.  Endo: Negative for unusual weight change.  Heme: Negative for bruising or bleeding. Allergy: Negative for rash or hives.       Physical Examination: Vital signs in last 24 hours: Temp:  [97.3 F (36.3 C)-98.4 F (36.9 C)] 97.6 F (36.4 C) (12/24 0935) Pulse Rate:  [48-64] 63 (12/24 0935) Resp:  [18-20] 18 (12/24 0935) BP: (98-141)/(57-84) 141/74 mmHg (12/24 0935) SpO2:  [94 %-100 %] 100 % (12/24 0935) Weight:  [122 lb 5.7 oz (55.5 kg)] 122 lb 5.7 oz (55.5 kg) (12/23 1537) Last BM Date: 05/19/13  General: Well-nourished, well-developed in no acute distress. Elderly. Hard of hearing. Head: Normocephalic, atraumatic.   Eyes: Conjunctiva pink, no icterus. Mouth: Oropharyngeal mucosa moist and pink , no lesions erythema or exudate. Neck: Supple without thyromegaly, masses, or lymphadenopathy.  Lungs: Clear to auscultation bilaterally.  Heart: Regular rate and rhythm, no murmurs rubs or gallops.  Abdomen: Bowel sounds are normal, moderate epigastric tenderness, nondistended, no hepatosplenomegaly or masses, no abdominal bruits, no rebound or guarding.  Large left inguinal hernia. Rectal: not performed Extremities: No lower extremity edema, clubbing, deformity.  Neuro: Alert and oriented x 4 , grossly normal neurologically.  Skin: Warm and dry, no rash or jaundice.   Psych: Alert and cooperative, normal mood and affect.        Intake/Output from previous day: 12/23 0701 - 12/24 0700 In: 1475 [I.V.:1475] Out: 325 [Urine:325] Intake/Output this shift:    Lab Results: CBC  Recent Labs  05/19/13 1300 05/20/13 0929  WBC 9.9 5.9  HGB 14.7 14.4  HCT 44.0 43.3  MCV 93.8 94.7  PLT 127* 140*   BMET  Recent Labs  05/19/13 1300 05/20/13 0929  NA 141 142  K 3.3* 4.6  CL 104 108  CO2 24 21  GLUCOSE 121* 79  BUN 23 18  CREATININE  0.96 0.91  CALCIUM 9.3 8.6   LFT  Recent Labs  05/19/13 1300 05/20/13 0929  BILITOT 3.5* 5.5*  ALKPHOS 156* 217*  AST 165*  128*  ALT 134* 150*  PROT 6.8 6.1  ALBUMIN 3.4* 2.9*   Component     Latest Ref Rng 05/19/2013 05/20/2013  Lipase     11 - 59 U/L 454 (H) 56    PT/INR No results found for this basename: LABPROT, INR,  in the last 72 hours     Imaging Studies: Ct Abdomen Pelvis W Contrast  05/19/2013   CLINICAL DATA:  Abdominal pain  EXAM: CT ABDOMEN AND PELVIS WITH CONTRAST  TECHNIQUE: Multidetector CT imaging of the abdomen and pelvis was performed using the standard protocol following bolus administration of intravenous contrast.  CONTRAST:  50mL OMNIPAQUE IOHEXOL 300 MG/ML SOLN, OMNIPAQUE IOHEXOL 300 MG/ML SOLN  COMPARISON:  CT 03/10/2013  FINDINGS: Marked biliary dilatation has developed since the prior study. Common bile duct measures 13 mm. There is intrahepatic biliary duct dilatation. This is most likely due to a common duct stone although I do not detect a definite stone or mass on today's study. Prior cholecystectomy. Bile ducts were not dilated on the prior study.  No focal liver mass lesion. Spleen is normal in size. Pancreas is atrophic without mass or edema.  3 mm right lower pole stone without renal obstruction. 6.7 cm cyst left kidney is stable. This has water density but does contain a small amount of calcification in the wall medially, unchanged from the prior study.  Abdominal aortic aneurysm measures 4.6 x 4.9 cm and is unchanged from the prior study. There is a large amount of mural thrombus. No evidence of aneurysm leak. Atherosclerotic iliac arteries without dilatation.  Sigmoid diverticulosis without evidence of acute diverticulitis. Surgical clips in the cecal region.  Large left inguinal hernia containing multiple loops of small bowel and omentum. No bowel obstruction is identified. Hernia is similar to the prior study.  Prostate is enlarged.  No  adenopathy is present.  IMPRESSION: Marked biliary duct dilatation with common bile duct measuring 13 mm. Suspect underlying obstruction due to common duct stone. No mass is identified causing obstruction.  Abdominal aortic aneurysm measuring 4.6 x 4.9 cm without evidence of leak.  Large left inguinal hernia without bowel obstruction.  Sigmoid diverticulosis without acute inflammation.   Electronically Signed   By: Marlan Palau M.D.   On: 05/19/2013 17:33   Dg Chest Portable 1 View  05/19/2013   CLINICAL DATA:  Chest pain  EXAM: PORTABLE CHEST - 1 VIEW  COMPARISON:  November 01, 2012  FINDINGS: There is no edema or consolidation. There appears to be a degree of underlying emphysematous change. There is stable apical pleural thickening bilaterally. Heart size is normal. Pulmonary vascularity is within normal limits. No adenopathy. There is atherosclerotic change in the aorta. No pneumothorax. No bone lesions.  IMPRESSION: Evidence of a degree of underlying emphysematous change. Stable apical pleural thickening bilaterally. No edema or consolidation.   Electronically Signed   By: Bretta Bang M.D.   On: 05/19/2013 13:42   US Abdomen Limited Ruq  05/20/2013   CLINICAL DATA:  Elevated LFTs.  EXAM: US ABDOMEN LIMITED - RIGHT UPPER QUADRANT  COMPARISON:  None.  FINDINGS: Gallbladder  Prior cholecystectomy  Common bile duct  Diameter: Dilated, 14 mm in diameter. This could be related to patient's age and post cholecystectomy state. No visible obstructing stone or mass. The bile duct within the pancreatic is obscured by bowel gas. Recommend correlation with bilirubin level.  Liver:  Mild intrahepatic biliary ductal dilatation and. No focal lesion. Normal echotexture.  IMPRESSION: Cholecystectomy.  Dilated intrahepatic and extrahepatic biliary ducts without visible obstructing stone or process (the distal duct in the pancreatic head is obscured by bowel gas). This could be related to post cholecystectomy state and  patient's age. Recommend correlation with bilirubin levels.   Electronically Signed   By: Charlett Nose M.D.   On: 05/20/2013 09:17  [4 week]  Impression: 79 y/o male with recurrent acute onset abdominal pain associated with bump in LFTs, lipase, new marked biliary dilation. No obvious CBD stones on CT or U/S. Similar presentation 06/2011. Limited EUS failed to show CBD stone. No prior MRCP or ERCP. Notable, patient had normal LFTs in 10/2012. He also has chronic intermittent diarrhea. Would be suspicious for undetected CBD stone or malignancy. At this time there is no evidence of cholangitis. Patient clinical feels better.   Discussed at length with patient's daughter, Debbrah Alar, [place]. Patient has had intermittent diarrhea since his 30s. Has significant anxiety and panic attacks. Histrionic behavior. Because of this she has requested that No one mention that he has an AAA. She is agreeable to ERCP with ES. I have discussed the risks, alternatives, benefits with regards to but not limited to the risk of reaction to medication, bleeding, infection, perforation and 10% risk of pancreatitis. She is agreeable to proceed. She is going to call nursing station to provide verbal consent now.   Plan: 1. Discussed with Dr. Jena Gauss. Would offer ERCP with Dr. Karilyn Cota for tomorrow. We will make patient NPO after midnight. Dr. Karilyn Cota to make final decision regarding ERCP and timing.  2. Hold Lovenox. Add SCDs. 3. Clear liquid diet today. 4. Lfts in am. 5. F/u stool studies. Likely chronic diarrhea related to IBS based on history that daughter provided.   LOS: 1 day   Tana Coast  05/20/2013, 1:04 PM

## 2013-05-21 ENCOUNTER — Encounter (HOSPITAL_COMMUNITY): Payer: Self-pay | Admitting: *Deleted

## 2013-05-21 ENCOUNTER — Inpatient Hospital Stay (HOSPITAL_COMMUNITY): Payer: Medicare Other

## 2013-05-21 ENCOUNTER — Encounter (HOSPITAL_COMMUNITY): Payer: Medicare Other | Admitting: Anesthesiology

## 2013-05-21 ENCOUNTER — Encounter (HOSPITAL_COMMUNITY)
Admission: EM | Disposition: A | Discharge status: Nursing Home (Includes ICF) | Payer: Self-pay | Source: Home / Self Care | Attending: Internal Medicine

## 2013-05-21 ENCOUNTER — Inpatient Hospital Stay (HOSPITAL_COMMUNITY): Payer: Medicare Other | Admitting: Anesthesiology

## 2013-05-21 DIAGNOSIS — K838 Other specified diseases of biliary tract: Secondary | ICD-10-CM

## 2013-05-21 DIAGNOSIS — K8050 Calculus of bile duct without cholangitis or cholecystitis without obstruction: Secondary | ICD-10-CM

## 2013-05-21 DIAGNOSIS — E039 Hypothyroidism, unspecified: Secondary | ICD-10-CM

## 2013-05-21 DIAGNOSIS — K859 Acute pancreatitis without necrosis or infection, unspecified: Secondary | ICD-10-CM

## 2013-05-21 HISTORY — PX: ERCP: SHX5425

## 2013-05-21 LAB — COMPREHENSIVE METABOLIC PANEL
ALT: 101 U/L — ABNORMAL HIGH (ref 0–53)
Alkaline Phosphatase: 193 U/L — ABNORMAL HIGH (ref 39–117)
CO2: 20 mEq/L (ref 19–32)
GFR calc Af Amer: 82 mL/min — ABNORMAL LOW (ref >90)
GFR calc non Af Amer: 70 mL/min — ABNORMAL LOW (ref >90)
Glucose, Bld: 86 mg/dL (ref 70–99)
Potassium: 4.1 mEq/L (ref 3.5–5.1)
Sodium: 141 mEq/L (ref 135–145)
Total Bilirubin: 1.6 mg/dL — ABNORMAL HIGH (ref 0.3–1.2)
Total Protein: 5.5 g/dL — ABNORMAL LOW (ref 6.0–8.3)

## 2013-05-21 SURGERY — ERCP, WITH INTERVENTION IF INDICATED
Anesthesia: General

## 2013-05-21 MED ORDER — EPHEDRINE SULFATE 50 MG/ML IJ SOLN
INTRAMUSCULAR | Status: AC
  Filled 2013-05-21: qty 1

## 2013-05-21 MED ORDER — LACTATED RINGERS IV SOLN
INTRAVENOUS | Status: DC
  Administered 2013-05-21: 1000 mL via INTRAVENOUS

## 2013-05-21 MED ORDER — FENTANYL CITRATE 0.05 MG/ML IJ SOLN
INTRAMUSCULAR | Status: AC
  Filled 2013-05-21: qty 2

## 2013-05-21 MED ORDER — CIPROFLOXACIN IN D5W 400 MG/200ML IV SOLN
INTRAVENOUS | Status: DC | PRN
  Administered 2013-05-21: 400 mg via INTRAVENOUS

## 2013-05-21 MED ORDER — SODIUM CHLORIDE BACTERIOSTATIC 0.9 % IJ SOLN
INTRAMUSCULAR | Status: AC
  Filled 2013-05-21: qty 20

## 2013-05-21 MED ORDER — IOHEXOL 350 MG/ML SOLN
INTRAVENOUS | Status: DC | PRN
  Administered 2013-05-21: 11:00:00

## 2013-05-21 MED ORDER — LIDOCAINE HCL (PF) 1 % IJ SOLN
INTRAMUSCULAR | Status: AC
  Filled 2013-05-21: qty 5

## 2013-05-21 MED ORDER — FENTANYL CITRATE 0.05 MG/ML IJ SOLN
INTRAMUSCULAR | Status: DC | PRN
  Administered 2013-05-21: 50 ug via INTRAVENOUS
  Administered 2013-05-21 (×2): 25 ug via INTRAVENOUS

## 2013-05-21 MED ORDER — GLUCAGON HCL (RDNA) 1 MG IJ SOLR
INTRAMUSCULAR | Status: AC
  Filled 2013-05-21: qty 1

## 2013-05-21 MED ORDER — LIDOCAINE HCL (CARDIAC) 20 MG/ML IV SOLN
INTRAVENOUS | Status: DC | PRN
  Administered 2013-05-21: 50 mg via INTRAVENOUS

## 2013-05-21 MED ORDER — GLYCOPYRROLATE 0.2 MG/ML IJ SOLN
INTRAMUSCULAR | Status: AC
  Filled 2013-05-21: qty 1

## 2013-05-21 MED ORDER — SODIUM CHLORIDE BACTERIOSTATIC 0.9 % IJ SOLN
INTRAMUSCULAR | Status: AC
  Filled 2013-05-21: qty 10

## 2013-05-21 MED ORDER — STERILE WATER FOR IRRIGATION IR SOLN
Status: DC | PRN
  Administered 2013-05-21: 11:00:00

## 2013-05-21 MED ORDER — ETOMIDATE 2 MG/ML IV SOLN
INTRAVENOUS | Status: DC | PRN
  Administered 2013-05-21: 12 mg via INTRAVENOUS

## 2013-05-21 MED ORDER — GLUCAGON HCL (RDNA) 1 MG IJ SOLR
INTRAMUSCULAR | Status: AC
  Administered 2013-05-21: 12:00:00
  Filled 2013-05-21: qty 1

## 2013-05-21 MED ORDER — METOPROLOL TARTRATE 1 MG/ML IV SOLN
INTRAVENOUS | Status: AC
  Filled 2013-05-21: qty 5

## 2013-05-21 MED ORDER — NALOXONE HCL 0.4 MG/ML IJ SOLN
INTRAMUSCULAR | Status: AC
  Filled 2013-05-21: qty 1

## 2013-05-21 MED ORDER — GLYCOPYRROLATE 0.2 MG/ML IJ SOLN
INTRAMUSCULAR | Status: DC | PRN
  Administered 2013-05-21: 0.2 mg via INTRAVENOUS

## 2013-05-21 MED ORDER — ARTIFICIAL TEARS OP OINT
TOPICAL_OINTMENT | OPHTHALMIC | Status: AC
  Filled 2013-05-21: qty 3.5

## 2013-05-21 MED ORDER — CIPROFLOXACIN IN D5W 400 MG/200ML IV SOLN
INTRAVENOUS | Status: AC
  Filled 2013-05-21: qty 200

## 2013-05-21 MED ORDER — PHENYLEPHRINE HCL 10 MG/ML IJ SOLN
INTRAMUSCULAR | Status: DC | PRN
  Administered 2013-05-21 (×2): 100 ug via INTRAVENOUS

## 2013-05-21 MED ORDER — SODIUM CHLORIDE 0.9 % IV SOLN
INTRAVENOUS | Status: AC
  Filled 2013-05-21: qty 50

## 2013-05-21 MED ORDER — GLUCAGON HCL (RDNA) 1 MG IJ SOLR
INTRAMUSCULAR | Status: DC | PRN
  Administered 2013-05-21: 0.25 mg via INTRAVENOUS

## 2013-05-21 MED ORDER — LACTATED RINGERS IV SOLN
INTRAVENOUS | Status: DC | PRN
  Administered 2013-05-21: 11:00:00 via INTRAVENOUS

## 2013-05-21 MED ORDER — SUCCINYLCHOLINE CHLORIDE 20 MG/ML IJ SOLN
INTRAMUSCULAR | Status: DC | PRN
  Administered 2013-05-21: 100 mg via INTRAVENOUS

## 2013-05-21 MED ORDER — PHENYLEPHRINE HCL 10 MG/ML IJ SOLN
INTRAMUSCULAR | Status: AC
  Filled 2013-05-21: qty 1

## 2013-05-21 MED ORDER — CIPROFLOXACIN IN D5W 400 MG/200ML IV SOLN
400.0000 mg | Freq: Once | INTRAVENOUS | Status: AC
  Administered 2013-05-21: 400 mg via INTRAVENOUS
  Filled 2013-05-21: qty 200

## 2013-05-21 SURGICAL SUPPLY — 26 items
BAG HAMPER (MISCELLANEOUS) ×2 IMPLANT
BALLN RETRIEVAL 12X15 (BALLOONS) ×2 IMPLANT
BASKET TRAPEZOID 3X6 (MISCELLANEOUS) IMPLANT
DEVICE INFLATION ENCORE 26 (MISCELLANEOUS) IMPLANT
DEVICE LOCKING W-BIOPSY CAP (MISCELLANEOUS) ×2 IMPLANT
GUIDEWIRE HYDRA JAGWIRE .35 (WIRE) IMPLANT
GUIDEWIRE JAG HINI 025X260CM (WIRE) IMPLANT
KIT CLEAN ENDO COMPLIANCE (KITS) ×2 IMPLANT
KIT ROOM TURNOVER APOR (KITS) ×2 IMPLANT
LUBRICANT JELLY 4.5OZ STERILE (MISCELLANEOUS) ×2 IMPLANT
NEEDLE HYPO 18GX1.5 BLUNT FILL (NEEDLE) IMPLANT
PAD ARMBOARD 7.5X6 YLW CONV (MISCELLANEOUS) ×4 IMPLANT
PATHFINDER 450CM 0.18 (STENTS) IMPLANT
POSITIONER HEAD 8X9X4 ADT (SOFTGOODS) ×2 IMPLANT
SNARE ROTATE MED OVAL 20MM (MISCELLANEOUS) IMPLANT
SNARE SHORT THROW 13M SML OVAL (MISCELLANEOUS) ×2 IMPLANT
SPHINCTEROTOME AUTOTOME .25 (MISCELLANEOUS) ×4 IMPLANT
SPHINCTEROTOME HYDRATOME 44 (MISCELLANEOUS) ×4 IMPLANT
SPONGE GAUZE 4X4 12PLY (GAUZE/BANDAGES/DRESSINGS) ×2 IMPLANT
SYR 3ML LL SCALE MARK (SYRINGE) ×2 IMPLANT
SYR 50ML LL SCALE MARK (SYRINGE) ×4 IMPLANT
SYSTEM CONTINUOUS INJECTION (MISCELLANEOUS) ×2 IMPLANT
TUBING ENDO SMARTCAP PENTAX (MISCELLANEOUS) ×2 IMPLANT
WALLSTENT METAL COVERED 10X60 (STENTS) IMPLANT
WALLSTENT METAL COVERED 10X80 (STENTS) IMPLANT
WATER STERILE IRR 1000ML POUR (IV SOLUTION) ×4 IMPLANT

## 2013-05-21 NOTE — Preoperative (Signed)
Beta Blockers   Reason not to administer Beta Blockers:Not Applicable 

## 2013-05-21 NOTE — Anesthesia Procedure Notes (Signed)
Procedure Name: Intubation Date/Time: 05/21/2013 10:45 AM Performed by: Pernell Dupre, Onetta Spainhower A Pre-anesthesia Checklist: Patient identified, Patient being monitored, Timeout performed, Emergency Drugs available and Suction available Patient Re-evaluated:Patient Re-evaluated prior to inductionOxygen Delivery Method: Circle System Utilized Preoxygenation: Pre-oxygenation with 100% oxygen Intubation Type: IV induction, Rapid sequence and Cricoid Pressure applied Ventilation: Mask ventilation without difficulty Laryngoscope Size: 3 and Miller Grade View: Grade I Tube type: Oral Tube size: 7.0 mm Number of attempts: 1 Airway Equipment and Method: stylet Placement Confirmation: ETT inserted through vocal cords under direct vision,  positive ETCO2 and breath sounds checked- equal and bilateral Secured at: 21 cm Tube secured with: Tape Dental Injury: Teeth and Oropharynx as per pre-operative assessment

## 2013-05-21 NOTE — Progress Notes (Signed)
Patient reports mild pain at epigastric region. He denies nausea vomiting shortness of breath or chest pain. He complains of hypogastric discomfort. He apparently has had prostate and ladder problems and has seen Dr. Patsi Sears. Abdominal exam reveals mild midepigastric tenderness. No organomegaly or masses. Lab data reviewed; Total bilirubin 1.6, AP 193, AST 70 and ALT 101. Serum albumin 2.5 INR 0.99. Assessment; Biliary pancreatitis. Significant drop in bilirubin in the last 24 hours. Patient's bile duct is 14 mm therefore likelihood of choledocholithiasis very high. He would benefit from sphincterotomy to prevent future episodes of biliary pancreatitis and if he still has stones these will be removed.

## 2013-05-21 NOTE — Anesthesia Preprocedure Evaluation (Signed)
Anesthesia Evaluation  Patient identified by MRN, date of birth, ID band Patient awake    Reviewed: Allergy & Precautions, H&P , NPO status   History of Anesthesia Complications Negative for: history of anesthetic complications  Airway Mallampati: II TM Distance: >3 FB Neck ROM: full    Dental  (+) Teeth Intact and Caps   Pulmonary COPD breath sounds clear to auscultation        Cardiovascular hypertension, On Medications + Peripheral Vascular Disease Rhythm:regular     Neuro/Psych    GI/Hepatic Biliary pancreatitis   Endo/Other  Hypothyroidism   Renal/GU      Musculoskeletal   Abdominal   Peds  Hematology   Anesthesia Other Findings   Reproductive/Obstetrics                           Anesthesia Physical Anesthesia Plan  ASA: III  Anesthesia Plan: General ETT, Rapid Sequence and Cricoid Pressure   Post-op Pain Management:    Induction:   Airway Management Planned:   Additional Equipment:   Intra-op Plan:   Post-operative Plan:   Informed Consent:   Dental Advisory Given  Plan Discussed with: CRNA and Surgeon  Anesthesia Plan Comments:         Anesthesia Quick Evaluation

## 2013-05-21 NOTE — Progress Notes (Signed)
[facility] PROGRESS NOTE  Corey Walker WJX:[id number] DOB: 27-Jan-1918 DOA: 05/19/2013 PCP: Rudi Heap, MD  Assessment/Plan: 1. Biliary Pancreatitis. Patient found to have dilated common bile duct and elevated liver function test. He underwent ERCP today with removal of CBD stones.  Repeat labs tomorrow. On clear liquids. 2. Obstructive jaundice, see #1. 3. Hypokalemia. Improved 4. Diarrhea, does not appear significant. 5. Hypertension. Stable 6. Polymyalgia rheumatica on chronic prednisone. Continue for now. 7. Abdominal aortic aneurysm. It appears stable. 8. Sinus bradycardia. Patient is asymptomatic. This occurred while patient was sleeping. Would not suggest further evaluation unless patient becomes symptomatic.  Code Status: DNR Family Communication: discussed with patient, no family present Disposition Plan: discharge back to [facility] once improved, hopefully tomorrow   Consultants:  gastroenterology  Procedures: ERCP: Marina Goodell ampullary diverticulum.  Markedly dilated CBD and CHD with choledocholithiasis.  Biliary sphincterotomy performed and multiple small cholesterol stones were removed.    Antibiotics:  none  HPI/Subjective: Seen in room after ERCP.  Complains of dry mouth, feels nauseous.  Objective: Filed Vitals:   05/21/13 1215  BP: 160/88  Pulse: 115  Temp: 97.5 F (36.4 C)  Resp: 14    Intake/Output Summary (Last 24 hours) at 05/21/13 1419 Last data filed at 05/21/13 1215  Gross per 24 hour  Intake   1070 ml  Output    250 ml  Net    820 ml   Filed Weights   05/19/13 1208 05/19/13 1537  Weight: 54.432 kg (120 lb) 55.5 kg (122 lb 5.7 oz)    Exam:   General:  NAD  Cardiovascular: S1, S2 RRR  Respiratory: CTA B  Abdomen: soft, nt, nd, bs+  Musculoskeletal: no edema b/l   Data Reviewed: Basic Metabolic Panel:  Recent Labs Lab 05/19/13 1300 05/20/13 0929 05/21/13 0524  NA 141 142 141  K 3.3* 4.6 4.1  CL 104 108  109  CO2 24 21 20   GLUCOSE 121* 79 86  BUN 23 18 20   CREATININE 0.96 0.91 0.92  CALCIUM 9.3 8.6 8.4   Liver Function Tests:  Recent Labs Lab 05/19/13 1300 05/20/13 0929 05/21/13 0524  AST 165* 128* 70*  ALT 134* 150* 101*  ALKPHOS 156* 217* 193*  BILITOT 3.5* 5.5* 1.6*  PROT 6.8 6.1 5.5*  ALBUMIN 3.4* 2.9* 2.5*    Recent Labs Lab 05/19/13 1300 05/20/13 0929  LIPASE 454* 56   No results found for this basename: AMMONIA,  in the last 168 hours CBC:  Recent Labs Lab 05/19/13 1300 05/20/13 0929  WBC 9.9 5.9  NEUTROABS 8.5*  --   HGB 14.7 14.4  HCT 44.0 43.3  MCV 93.8 94.7  PLT 127* 140*   Cardiac Enzymes:  Recent Labs Lab 05/19/13 1300 05/19/13 2018  TROPONINI <0.30 <0.30   BNP (last 3 results) No results found for this basename: PROBNP,  in the last 8760 hours CBG: No results found for this basename: GLUCAP,  in the last 168 hours  Recent Results (from the past 240 hour(s))  MRSA PCR SCREENING     Status: None   Collection Time    05/19/13  4:43 PM      Result Value Range Status   MRSA by PCR NEGATIVE  NEGATIVE Final   Comment:            The GeneXpert MRSA Assay (FDA     approved for NASAL specimens     only), is one component of a     comprehensive MRSA colonization  surveillance program. It is not     intended to diagnose MRSA     infection nor to guide or     monitor treatment for     MRSA infections.     Studies: Ct Abdomen Pelvis W Contrast  05/19/2013   CLINICAL DATA:  Abdominal pain  EXAM: CT ABDOMEN AND PELVIS WITH CONTRAST  TECHNIQUE: Multidetector CT imaging of the abdomen and pelvis was performed using the standard protocol following bolus administration of intravenous contrast.  CONTRAST:  50mL OMNIPAQUE IOHEXOL 300 MG/ML SOLN, OMNIPAQUE IOHEXOL 300 MG/ML SOLN  COMPARISON:  CT 03/10/2013  FINDINGS: Marked biliary dilatation has developed since the prior study. Common bile duct measures 13 mm. There is intrahepatic biliary  duct dilatation. This is most likely due to a common duct stone although I do not detect a definite stone or mass on today's study. Prior cholecystectomy. Bile ducts were not dilated on the prior study.  No focal liver mass lesion. Spleen is normal in size. Pancreas is atrophic without mass or edema.  3 mm right lower pole stone without renal obstruction. 6.7 cm cyst left kidney is stable. This has water density but does contain a small amount of calcification in the wall medially, unchanged from the prior study.  Abdominal aortic aneurysm measures 4.6 x 4.9 cm and is unchanged from the prior study. There is a large amount of mural thrombus. No evidence of aneurysm leak. Atherosclerotic iliac arteries without dilatation.  Sigmoid diverticulosis without evidence of acute diverticulitis. Surgical clips in the cecal region.  Large left inguinal hernia containing multiple loops of small bowel and omentum. No bowel obstruction is identified. Hernia is similar to the prior study.  Prostate is enlarged.  No adenopathy is present.  IMPRESSION: Marked biliary duct dilatation with common bile duct measuring 13 mm. Suspect underlying obstruction due to common duct stone. No mass is identified causing obstruction.  Abdominal aortic aneurysm measuring 4.6 x 4.9 cm without evidence of leak.  Large left inguinal hernia without bowel obstruction.  Sigmoid diverticulosis without acute inflammation.   Electronically Signed   By: Marlan Palau M.D.   On: 05/19/2013 17:33   Dg Ercp  05/21/2013   CLINICAL DATA:  Choledocholithiasis, biliary pancreatitis, jaundice  EXAM: ERCP  COMPARISON:  05/19/2013 CT, 05/20/2013 ultrasound  FLUOROSCOPY TIME:  Spot fluoroscopic imaging during the ERCP  FINDINGS: Spot fluoroscopic views during the ERCP demonstrate contrast injection of the common bile duct. Common bile duct is moderately dilated. Distal CBD irregular small filling defects noted suspicious for choledocholithiasis. Balloon sweep  performed for stone extraction and sphincterotomy.  IMPRESSION: Limited imaging during ERCP as above.   Electronically Signed   By: Ruel Favors M.D.   On: 05/21/2013 12:46   US Abdomen Limited Ruq  05/20/2013   CLINICAL DATA:  Elevated LFTs.  EXAM: US ABDOMEN LIMITED - RIGHT UPPER QUADRANT  COMPARISON:  None.  FINDINGS: Gallbladder  Prior cholecystectomy  Common bile duct  Diameter: Dilated, 14 mm in diameter. This could be related to patient's age and post cholecystectomy state. No visible obstructing stone or mass. The bile duct within the pancreatic is obscured by bowel gas. Recommend correlation with bilirubin level.  Liver:  Mild intrahepatic biliary ductal dilatation and. No focal lesion. Normal echotexture.  IMPRESSION: Cholecystectomy.  Dilated intrahepatic and extrahepatic biliary ducts without visible obstructing stone or process (the distal duct in the pancreatic head is obscured by bowel gas). This could be related to post cholecystectomy state and patient's  age. Recommend correlation with bilirubin levels.   Electronically Signed   By: Charlett Nose M.D.   On: 05/20/2013 09:17    Scheduled Meds: . antiseptic oral rinse  15 mL Mouth Rinse BID  . cholecalciferol  400 Units Oral BID  . fluticasone  2 puff Inhalation BID  . levothyroxine  50 mcg Oral QAC breakfast  . lisinopril  10 mg Oral Daily  . predniSONE  5 mg Oral BID  . sodium chloride      . sodium chloride      . sodium chloride  3 mL Intravenous Q12H   Continuous Infusions: . 0.9 % NaCl with KCl 20 mEq / L 100 mL/hr at 05/19/13 1542  . lactated ringers 1,000 mL (05/21/13 0934)    Principal Problem:   Pancreatitis Active Problems:   Obstructive jaundice   HTN (hypertension)   Polymyalgia rheumatica   Hypothyroidism   AAA (abdominal aortic aneurysm)   Transaminitis   Hypokalemia   Diarrhea    Time spent:    MEMON,JEHANZEB  [facility] Pager [phone]. If 7PM-7AM, please contact night-coverage  at [email or web address], password [email or web address] 05/21/2013, 2:19 PM  LOS: 2 days

## 2013-05-21 NOTE — Op Note (Addendum)
ERCP PROCEDURE REPORT  PATIENT:  Corey Walker  MR#:  [id number] Birthdate:  08/20/1917, 79 y.o., male Endoscopist:  Dr. Malissa Hippo, MD Referred By:  Dr. Erick Blinks, MD. Procedure Date: 05/21/2013  Procedure:   ERCP with sphincterotomy and stone extraction.    Indications:  Patient is a 79 year old Caucasian male who presents with biliary pancreatitis and jaundice. CBD measures 14 mm. He is suspected to have choledocholithiasis. He has had cholecystectomy.            Informed Consent:  The risks, benefits, limitations, alternatives, and mponderable have been reviewed with the patient. I specifically discussed a 1 in 10 chance of pancreatitis, reaction to medications, bleeding, perforation and the possibility of a failed ERCP. Potential for sphincterotomy and stent placement also reviewed. Questions have been answered. All parties agreeable.  Please see history & physical in medical record for more information.  Medications:  *Please see anesthesia record for complete details  Description of procedure:  Procedure performed in the OR. The patient was placed under anesthesia, intubated, and turned into semipermanent position. Therapeutic Pentax video duodenoscope passed through the oropharynx without any difficulty into the esophagus, stomach, and across the pylorus and pull, and descending duodenum.  CBD was selectively cannulated with a 03 hydride Jagwire and Rx 44 autotome.  Findings:  Periampullary diverticulum noted. PD cannulated once with guidewire but contrast not injected. Markedly dilated CBD and CHD and prominent intrahepatic biliary radicles. Distal CBD segment did not fill suggesting stones. Biliary sphincterotomy performed. Multiple small cholesterol stones removed largest one was about 5 mm.   Therapeutic/Diagnostic Maneuvers Performed:  See findings  Complications:  None  Impression:  Marina Goodell ampullary diverticulum. Markedly dilated CBD and CHD with  choledocholithiasis. Biliary sphincterotomy performed and multiple small cholesterol stones were removed.  Recommendations:  No aspirin or heparin for 72 hours. Clear liquids today. Lab in a.m.  Corbet Hanley U  05/21/2013  11:26 AM  CC: Dr. Rudi Heap, MD & Dr. Bonnetta Barry ref. provider found   Addendum; Patient has not undergone cholecystectomy as indicated in my note. Review of films reveal that he may have small rudimentary gallbladder. This is best seen on CT.

## 2013-05-21 NOTE — Progress Notes (Signed)
Pt NPO at midnight

## 2013-05-21 NOTE — Transfer of Care (Signed)
Immediate Anesthesia Transfer of Care Note  Patient: Corey Walker  Procedure(s) Performed: Procedure(s): ENDOSCOPIC RETROGRADE CHOLANGIOPANCREATOGRAPHY (ERCP) Sphincterotomy and stone extraction (N/A)  Patient Location: PACU  Anesthesia Type:General  Level of Consciousness: sedated and patient cooperative  Airway & Oxygen Therapy: Patient Spontanous Breathing and Patient connected to face mask oxygen  Post-op Assessment: Report given to PACU RN and Post -op Vital signs reviewed and stable  Post vital signs: Reviewed and stable  Complications: No apparent anesthesia complications

## 2013-05-21 NOTE — Anesthesia Postprocedure Evaluation (Signed)
  Anesthesia Post-op Note  Patient: Corey Walker  Procedure(s) Performed: Procedure(s): ENDOSCOPIC RETROGRADE CHOLANGIOPANCREATOGRAPHY (ERCP) Sphincterotomy and stone extraction (N/A)  Patient Location: PACU  Anesthesia Type:General  Level of Consciousness: sedated and patient cooperative  Airway and Oxygen Therapy: Patient Spontanous Breathing and Patient connected to face mask oxygen  Post-op Pain: none  Post-op Assessment: Post-op Vital signs reviewed, Patient's Cardiovascular Status Stable, Respiratory Function Stable, Patent Airway, No signs of Nausea or vomiting and Pain level controlled  Post-op Vital Signs: Reviewed and stable  Complications: No apparent anesthesia complications

## 2013-05-21 NOTE — Progress Notes (Signed)
Central telemetry contacted this nurse to inform that the patient had converted from NSR to a junctional rhythm. Patient in no acute distress. No c/o chest pain or discomfort. Says he feels "fine". Attempted to get a full set of vital signs but the patient refused. He did let me take his pulse which was 56. Contacted mid level physician on call. Order received for a stat EKG. Will continue to monitor closely.

## 2013-05-22 NOTE — Progress Notes (Signed)
Subjective; Patient has no complaints. He ate all of his breakfast. He denies abdominal pain.  Objective; BP 111/72  Pulse 59  Temp(Src) 97.5 F (36.4 C) (Oral)  Resp 15  Ht 5\' 11"  (1.803 m)  Wt 122 lb 5.7 oz (55.5 kg)  BMI 17.07 kg/m2  SpO2 97% Patient is alert and sitting in chair. Abdomen is soft and nontender. He has large left inguinal hernia extending into the scrotum.  Lab data; Not available as patient declined. I went over CT films with Dr. Ulyses Southward. It appears he may have a very small rudimentary gallbladder.  Assessment;  Biliary pancreatitis. Patient is status post therapeutic ERCP yesterday with removal of multiple stones. Patient is doing very well. I went over ERCP findings with patient's daughter Ms. Corey Walker.  Recommendations; Agree with DC plans. No aspirin or NSAIDs for 3 days. Patient will have LFTs repeated by PCP on his next visit in 2 weeks.

## 2013-05-22 NOTE — Discharge Planning (Signed)
Pt and family stated he was ready to be discharged and he had no pain.  Pt's IV and tele were DCd and they were given DC papers and educated as to what s/sx may cause them to call the doctor or return to the hospital.  Pt will be going to [facility] and will be driven there by family.  Pt will be wheeled to car by PCT and family when ready.

## 2013-05-22 NOTE — Discharge Summary (Signed)
Physician Discharge Summary  ZARIF RATHJE ZOX:[id number] DOB: 10-04-1917 DOA: 05/19/2013  PCP: Rudi Heap, MD  Admit date: 05/19/2013 Discharge date: 05/22/2013  Time spent: 45 minutes  Recommendations for Outpatient Follow-up:  1. Follow up with primary care doctor in 2 weeks and have liver function tests checked  Discharge Diagnoses:  Principal Problem:   Pancreatitis Active Problems:   Obstructive jaundice   HTN (hypertension)   Polymyalgia rheumatica   Hypothyroidism   AAA (abdominal aortic aneurysm)   Transaminitis   Hypokalemia   Diarrhea   Discharge Condition: improved  Diet recommendation: cardiac diet  Filed Weights   05/19/13 1208 05/19/13 1537  Weight: 54.432 kg (120 lb) 55.5 kg (122 lb 5.7 oz)    History of present illness:  Corey Walker is a very pleasant VERY hard of hearing 79 y.o. male with past medical history that includes hypertension, polymyalgia rheumatica, history of colon cancer status post resection and left inguinal hernia, pancreatitis with obstructive jaundice presents to the emergency room with the chief complaint of abdominal pain and diarrhea. Information is obtained from the patient in spite of his very poor hearing. He indicates he was in his usual state of health until this morning when he developed sudden sharp constant epigastric pain. He rates the pain a 10 out of 10 at its worst an 8/10 at its best. Associated symptoms include diarrhea nausea without vomiting. He indicates that he has had nothing to either eat or drink today. He reports at least 3 episodes of loose stool that he describes as "dark". He denies chest pain palpitations shortness of breath cough fever chills or recent sick contacts. He denies dysuria hematuria frequency or urgency. He denies headache visual disturbance numbness or tingling of extremities syncope or near-syncope. In the emergency room his vital signs are stable, and he was not hypoxic. Basic metabolic panel  significant for potassium of 3.3 out phosphate 156, lipase 454, AST 165, total bilirubin 3.5. Lactic acid was 2.5. CT scan results pending at the time of my exam. We are asked to admit   Hospital Course:  This patient was admitted with abdominal pain, nausea, vomiting. He was found to have a biliary pancreatitis. Patient's lipase normalized the following day of admission with n.p.o. status IV fluids. He was found to have elevated liver function tests and hyperbilirubinemia. Imaging indicated a dilated common bile duct. He was seen by gastroenterology underwent ERCP with sphincterotomy and removal of several small cholesterol stones. The patient has clinically improved since admission. He is now tolerating a regular diet without any difficulty. He does not have any nausea and vomiting. He's been cleared for discharge by gastroenterology. He will need followup with his primary care physician in 2 weeks with LFTs.  Of note, patient has not undergone cholecystectomy. Review of films by Dr. Karilyn Cota indicate that he may have a small rudimentary gallbladder.  Procedures: ERCP: Periampullary diverticulum noted.  PD cannulated once with guidewire but contrast not injected.  Markedly dilated CBD and CHD and prominent intrahepatic biliary radicles.  Distal CBD segment did not fill suggesting stones.  Biliary sphincterotomy performed.  Multiple small cholesterol stones removed largest one was about 5 mm.     Consultations:  Gastroenterology  Discharge Exam: Filed Vitals:   05/22/13 0558  BP:   Pulse: 59  Temp: 97.5 F (36.4 C)  Resp: 15    General: NAD Cardiovascular: S1 S2 RRR Respiratory: CTA B  Discharge Instructions  Discharge Orders   Future Orders Complete By  Expires   Call MD for:  persistant nausea and vomiting  As directed    Call MD for:  severe uncontrolled pain  As directed    Call MD for:  temperature >100.4  As directed    Diet - low sodium heart healthy  As directed     Increase activity slowly  As directed        Medication List         anti-nausea solution  Take 5 mLs by mouth every 15 (fifteen) minutes as needed. As needed to stop emesis     cholecalciferol 400 UNITS Tabs tablet  Commonly known as:  VITAMIN D  Take 400 Units by mouth 2 (two) times daily.     cyanocobalamin 1000 MCG/ML injection  Commonly known as:  (VITAMIN B-12)  Inject 1,000 mcg into the muscle every 30 (thirty) days.     ferrous sulfate 325 (65 FE) MG tablet  Take 325 mg by mouth daily with breakfast.     furosemide 20 MG tablet  Commonly known as:  LASIX  Take 20 mg by mouth every other day.     levothyroxine 50 MCG tablet  Commonly known as:  SYNTHROID, LEVOTHROID  Take 50 mcg by mouth daily.     lisinopril 10 MG tablet  Commonly known as:  PRINIVIL,ZESTRIL  Take 10 mg by mouth daily.     predniSONE 5 MG tablet  Commonly known as:  DELTASONE  Take 5 mg by mouth 2 (two) times daily.     promethazine 12.5 MG tablet  Commonly known as:  PHENERGAN  Take 12.5 mg by mouth every 8 (eight) hours as needed for nausea.     PULMICORT FLEXHALER 90 MCG/ACT inhaler  Generic drug:  Budesonide  Inhale 1 puff into the lungs 2 (two) times daily.       Allergies  Allergen Reactions  . Darvocet [Propoxyphene N-Acetaminophen]     Propoxyphene (Per MAR)  . Fish Oil   . Lactose Intolerance (Gi)   . Loratadine [Loratadine] Other (See Comments)    dizziness (PER MAR)  . Penicillins Swelling    Swelling of hands (per [facility] records)      The results of significant diagnostics from this hospitalization (including imaging, microbiology, ancillary and laboratory) are listed below for reference.    Significant Diagnostic Studies: Ct Abdomen Pelvis W Contrast  05/19/2013   CLINICAL DATA:  Abdominal pain  EXAM: CT ABDOMEN AND PELVIS WITH CONTRAST  TECHNIQUE: Multidetector CT imaging of the abdomen and pelvis was performed using the standard protocol following bolus  administration of intravenous contrast.  CONTRAST:  50mL OMNIPAQUE IOHEXOL 300 MG/ML SOLN, OMNIPAQUE IOHEXOL 300 MG/ML SOLN  COMPARISON:  CT 03/10/2013  FINDINGS: Marked biliary dilatation has developed since the prior study. Common bile duct measures 13 mm. There is intrahepatic biliary duct dilatation. This is most likely due to a common duct stone although I do not detect a definite stone or mass on today's study. Prior cholecystectomy. Bile ducts were not dilated on the prior study.  No focal liver mass lesion. Spleen is normal in size. Pancreas is atrophic without mass or edema.  3 mm right lower pole stone without renal obstruction. 6.7 cm cyst left kidney is stable. This has water density but does contain a small amount of calcification in the wall medially, unchanged from the prior study.  Abdominal aortic aneurysm measures 4.6 x 4.9 cm and is unchanged from the prior study. There is a large amount  of mural thrombus. No evidence of aneurysm leak. Atherosclerotic iliac arteries without dilatation.  Sigmoid diverticulosis without evidence of acute diverticulitis. Surgical clips in the cecal region.  Large left inguinal hernia containing multiple loops of small bowel and omentum. No bowel obstruction is identified. Hernia is similar to the prior study.  Prostate is enlarged.  No adenopathy is present.  IMPRESSION: Marked biliary duct dilatation with common bile duct measuring 13 mm. Suspect underlying obstruction due to common duct stone. No mass is identified causing obstruction.  Abdominal aortic aneurysm measuring 4.6 x 4.9 cm without evidence of leak.  Large left inguinal hernia without bowel obstruction.  Sigmoid diverticulosis without acute inflammation.   Electronically Signed   By: Marlan Palau M.D.   On: 05/19/2013 17:33   Dg Chest Portable 1 View  05/19/2013   CLINICAL DATA:  Chest pain  EXAM: PORTABLE CHEST - 1 VIEW  COMPARISON:  November 01, 2012  FINDINGS: There is no edema or consolidation.  There appears to be a degree of underlying emphysematous change. There is stable apical pleural thickening bilaterally. Heart size is normal. Pulmonary vascularity is within normal limits. No adenopathy. There is atherosclerotic change in the aorta. No pneumothorax. No bone lesions.  IMPRESSION: Evidence of a degree of underlying emphysematous change. Stable apical pleural thickening bilaterally. No edema or consolidation.   Electronically Signed   By: Bretta Bang M.D.   On: 05/19/2013 13:42   Dg Ercp  05/21/2013   CLINICAL DATA:  Choledocholithiasis, biliary pancreatitis, jaundice  EXAM: ERCP  COMPARISON:  05/19/2013 CT, 05/20/2013 ultrasound  FLUOROSCOPY TIME:  Spot fluoroscopic imaging during the ERCP  FINDINGS: Spot fluoroscopic views during the ERCP demonstrate contrast injection of the common bile duct. Common bile duct is moderately dilated. Distal CBD irregular small filling defects noted suspicious for choledocholithiasis. Balloon sweep performed for stone extraction and sphincterotomy.  IMPRESSION: Limited imaging during ERCP as above.   Electronically Signed   By: Ruel Favors M.D.   On: 05/21/2013 12:46   US Abdomen Limited Ruq  05/20/2013   CLINICAL DATA:  Elevated LFTs.  EXAM: US ABDOMEN LIMITED - RIGHT UPPER QUADRANT  COMPARISON:  None.  FINDINGS: Gallbladder  Prior cholecystectomy  Common bile duct  Diameter: Dilated, 14 mm in diameter. This could be related to patient's age and post cholecystectomy state. No visible obstructing stone or mass. The bile duct within the pancreatic is obscured by bowel gas. Recommend correlation with bilirubin level.  Liver:  Mild intrahepatic biliary ductal dilatation and. No focal lesion. Normal echotexture.  IMPRESSION: Cholecystectomy.  Dilated intrahepatic and extrahepatic biliary ducts without visible obstructing stone or process (the distal duct in the pancreatic head is obscured by bowel gas). This could be related to post cholecystectomy state  and patient's age. Recommend correlation with bilirubin levels.   Electronically Signed   By: Charlett Nose M.D.   On: 05/20/2013 09:17    Microbiology: Recent Results (from the past 240 hour(s))  MRSA PCR SCREENING     Status: None   Collection Time    05/19/13  4:43 PM      Result Value Range Status   MRSA by PCR NEGATIVE  NEGATIVE Final   Comment:            The GeneXpert MRSA Assay (FDA     approved for NASAL specimens     only), is one component of a     comprehensive MRSA colonization     surveillance program. It is not  intended to diagnose MRSA     infection nor to guide or     monitor treatment for     MRSA infections.     Labs: Basic Metabolic Panel:  Recent Labs Lab 05/19/13 1300 05/20/13 0929 05/21/13 0524  NA 141 142 141  K 3.3* 4.6 4.1  CL 104 108 109  CO2 24 21 20   GLUCOSE 121* 79 86  BUN 23 18 20   CREATININE 0.96 0.91 0.92  CALCIUM 9.3 8.6 8.4   Liver Function Tests:  Recent Labs Lab 05/19/13 1300 05/20/13 0929 05/21/13 0524  AST 165* 128* 70*  ALT 134* 150* 101*  ALKPHOS 156* 217* 193*  BILITOT 3.5* 5.5* 1.6*  PROT 6.8 6.1 5.5*  ALBUMIN 3.4* 2.9* 2.5*    Recent Labs Lab 05/19/13 1300 05/20/13 0929  LIPASE 454* 56   No results found for this basename: AMMONIA,  in the last 168 hours CBC:  Recent Labs Lab 05/19/13 1300 05/20/13 0929  WBC 9.9 5.9  NEUTROABS 8.5*  --   HGB 14.7 14.4  HCT 44.0 43.3  MCV 93.8 94.7  PLT 127* 140*   Cardiac Enzymes:  Recent Labs Lab 05/19/13 1300 05/19/13 2018  TROPONINI <0.30 <0.30   BNP: BNP (last 3 results) No results found for this basename: PROBNP,  in the last 8760 hours CBG: No results found for this basename: GLUCAP,  in the last 168 hours     Signed:  Zeek Rostron  [facility] 05/22/2013, 1:29 PM

## 2013-05-22 NOTE — Clinical Social Work Note (Signed)
Pt d/c today back to [facility] of [address]. Pt, daughter, and facility aware and agreeable. Pt to transfer with daughter. D/C summary and FL2 faxed. Out of facility DNR in packet.   Derenda Fennel, [place] [phone]

## 2013-05-25 ENCOUNTER — Encounter (HOSPITAL_COMMUNITY): Payer: Self-pay | Admitting: Internal Medicine

## 2013-07-23 ENCOUNTER — Ambulatory Visit: Payer: Medicare Other | Admitting: Family Medicine

## 2013-07-27 ENCOUNTER — Telehealth: Payer: Self-pay | Admitting: Family Medicine

## 2013-07-31 NOTE — Telephone Encounter (Signed)
Attempts to contact patient were unsuccessful.

## 2013-09-23 ENCOUNTER — Encounter: Payer: Self-pay | Admitting: Family Medicine

## 2013-09-23 ENCOUNTER — Ambulatory Visit (INDEPENDENT_AMBULATORY_CARE_PROVIDER_SITE_OTHER): Payer: Medicare Other | Admitting: Family Medicine

## 2013-09-23 VITALS — BP 126/75 | HR 79 | Temp 98.1°F | Wt 117.0 lb

## 2013-09-23 DIAGNOSIS — E538 Deficiency of other specified B group vitamins: Secondary | ICD-10-CM

## 2013-09-23 DIAGNOSIS — R5383 Other fatigue: Secondary | ICD-10-CM

## 2013-09-23 DIAGNOSIS — E039 Hypothyroidism, unspecified: Secondary | ICD-10-CM

## 2013-09-23 DIAGNOSIS — F0390 Unspecified dementia without behavioral disturbance: Secondary | ICD-10-CM

## 2013-09-23 DIAGNOSIS — R5381 Other malaise: Secondary | ICD-10-CM

## 2013-09-23 MED ORDER — CYANOCOBALAMIN 500 MCG PO TABS: 500.0000 ug | ORAL_TABLET | Freq: Every day | ORAL | Status: AC

## 2013-09-23 MED ORDER — DONEPEZIL HCL 5 MG PO TABS: 5.0000 mg | ORAL_TABLET | Freq: Every day | ORAL | Status: DC

## 2013-09-23 NOTE — Progress Notes (Signed)
   Subjective:    Patient ID: LEQUAN DOBRATZ, male    DOB: September 10, 1917, 80 y.o.   MRN: [id number]  HPI This 80 y.o. male presents for evaluation of routine visit.  He has been having some forgetfulness  And his family is concerned about him having dementia sx's according to health care provider who Brings him to his appointment.  He is otherwise doing fine.   Review of Systems No chest pain, SOB, HA, dizziness, vision change, N/V, diarrhea, constipation, dysuria, urinary urgency or frequency, myalgias, arthralgias or rash.     Objective:   Physical Exam Vital signs noted  Well developed well nourished elderly male in NAD.  HEENT - Head atraumatic Normocephalic                Eyes - PERRLA, Conjuctiva - clear Sclera- Clear EOMI                Ears - EAC's Wnl TM's Wnl Gross Hearing WNL                Nose - Nares patent                 Throat - oropharanx wnl Respiratory - Lungs CTA bilateral Cardiac - RRR S1 and S2 without murmur GI - Abdomen soft Nontender and bowel sounds active x 4 Extremities - No edema. Neuro - Grossly intact.       Assessment & Plan:  B12 deficiency - Plan: cyanocobalamin (V-R VITAMIN B-12) 500 MCG tablet, POCT CBC, CMP14+EGFR, donepezil (ARICEPT) 5 MG tablet  Other malaise and fatigue - Plan: POCT CBC, CMP14+EGFR, donepezil (ARICEPT) 5 MG tablet  Dementia - Plan: donepezil (ARICEPT) 5 MG tablet  Unspecified hypothyroidism - Plan: TSH  Follow up in 3 months  Lysbeth Penner FNP

## 2013-09-23 NOTE — Addendum Note (Signed)
Addended by: Pollyann Kennedy F on: 09/23/2013 05:39 PM   Modules accepted: Orders

## 2013-09-24 LAB — CMP14+EGFR
ALT: 21 IU/L (ref 0–44)
AST: 34 IU/L (ref 0–40)
Albumin/Globulin Ratio: 1.4 (ref 1.1–2.5)
Albumin: 3.8 g/dL (ref 3.2–4.6)
Alkaline Phosphatase: 187 IU/L — ABNORMAL HIGH (ref 39–117)
BUN/Creatinine Ratio: 15 (ref 10–22)
BUN: 16 mg/dL (ref 10–36)
CO2: 24 mmol/L (ref 18–29)
Calcium: 9 mg/dL (ref 8.6–10.2)
Chloride: 99 mmol/L (ref 97–108)
Creatinine, Ser: 1.09 mg/dL (ref 0.76–1.27)
GFR calc Af Amer: 67 mL/min/{1.73_m2} (ref >59)
GFR calc non Af Amer: 58 mL/min/{1.73_m2} — ABNORMAL LOW (ref >59)
Globulin, Total: 2.8 g/dL (ref 1.5–4.5)
Glucose: 120 mg/dL — ABNORMAL HIGH (ref 65–99)
Potassium: 4.6 mmol/L (ref 3.5–5.2)
Sodium: 143 mmol/L (ref 134–144)
Total Bilirubin: 0.7 mg/dL (ref 0.0–1.2)
Total Protein: 6.6 g/dL (ref 6.0–8.5)

## 2013-09-24 LAB — CBC WITH DIFFERENTIAL
Basophils Absolute: 0 10*3/uL (ref 0.0–0.2)
Basos: 0 %
Eos: 0 %
Eosinophils Absolute: 0 10*3/uL (ref 0.0–0.4)
HCT: 44.3 % (ref 37.5–51.0)
Hemoglobin: 14.6 g/dL (ref 12.6–17.7)
Immature Grans (Abs): 0 10*3/uL (ref 0.0–0.1)
Immature Granulocytes: 0 %
Lymphocytes Absolute: 0.5 10*3/uL — ABNORMAL LOW (ref 0.7–3.1)
Lymphs: 8 %
MCH: 30.5 pg (ref 26.6–33.0)
MCHC: 33 g/dL (ref 31.5–35.7)
MCV: 93 fL (ref 79–97)
Monocytes Absolute: 0.3 10*3/uL (ref 0.1–0.9)
Monocytes: 5 %
Neutrophils Absolute: 5.3 10*3/uL (ref 1.4–7.0)
Neutrophils Relative %: 87 %
Platelets: 195 10*3/uL (ref 150–379)
RBC: 4.78 x10E6/uL (ref 4.14–5.80)
RDW: 13.7 % (ref 12.3–15.4)
WBC: 6.1 10*3/uL (ref 3.4–10.8)

## 2013-09-24 LAB — TSH: TSH: 3.16 u[IU]/mL (ref 0.450–4.500)

## 2013-10-13 ENCOUNTER — Telehealth: Payer: Self-pay | Admitting: Family Medicine

## 2013-10-13 NOTE — Telephone Encounter (Signed)
Patient aware of fathers labs

## 2013-12-02 ENCOUNTER — Encounter: Payer: Self-pay | Admitting: Family Medicine

## 2013-12-02 ENCOUNTER — Ambulatory Visit (INDEPENDENT_AMBULATORY_CARE_PROVIDER_SITE_OTHER): Payer: Medicare Other | Admitting: Family Medicine

## 2013-12-02 VITALS — BP 145/97 | HR 84 | Temp 98.3°F | Ht 71.0 in | Wt 116.4 lb

## 2013-12-02 DIAGNOSIS — J069 Acute upper respiratory infection, unspecified: Secondary | ICD-10-CM

## 2013-12-02 DIAGNOSIS — H109 Unspecified conjunctivitis: Secondary | ICD-10-CM

## 2013-12-02 MED ORDER — POLYMYXIN B-TRIMETHOPRIM 10000-0.1 UNIT/ML-% OP SOLN: 1.0000 [drp] | OPHTHALMIC | Status: DC

## 2013-12-02 MED ORDER — AZITHROMYCIN 250 MG PO TABS: ORAL_TABLET | ORAL | Status: DC

## 2013-12-02 NOTE — Progress Notes (Signed)
   Subjective:    Patient ID: Corey Walker, male    DOB: 11-Mar-1918, 80 y.o.   MRN: [id number]  HPI This 80 y.o. male presents for evaluation of burning eyes and uri sx's for 2 days.   Review of Systems C/o burning eyes and uri sx's No chest pain, SOB, HA, dizziness, vision change, N/V, diarrhea, constipation, dysuria, urinary urgency or frequency, myalgias, arthralgias or rash.     Objective:   Physical Exam Vital signs noted  Well developed well nourished male.  HEENT - Head atraumatic Normocephalic                Eyes - PERRLA, Conjuctiva - injected Sclera- Clear EOMI                Ears - EAC's Wnl TM's Wnl Gross Hearing WNL                Throat - oropharanx wnl Respiratory - Lungs CTA bilateral Cardiac - RRR S1 and S2 without murmur GI - Abdomen soft Nontender and bowel sounds active x 4 Extremities - No edema. Neuro - Grossly intact.       Assessment & Plan:  Conjunctivitis, unspecified laterality - Plan: trimethoprim-polymyxin b (POLYTRIM) ophthalmic solution  URI, acute - Plan: azithromycin (ZITHROMAX) 250 MG tablet  Push po fluids, rest, tylenol and motrin otc prn as directed for fever, arthralgias, and myalgias.  Follow up prn if sx's continue or persist.  Lysbeth Penner FNP

## 2013-12-21 ENCOUNTER — Ambulatory Visit: Payer: Medicare Other | Admitting: Family Medicine

## 2014-02-11 ENCOUNTER — Ambulatory Visit: Payer: Medicare Other | Admitting: Family Medicine

## 2014-04-11 ENCOUNTER — Emergency Department (HOSPITAL_COMMUNITY): Payer: Medicare Other

## 2014-04-11 ENCOUNTER — Inpatient Hospital Stay (HOSPITAL_COMMUNITY)
Admission: EM | Admit: 2014-04-11 | Discharge: 2014-04-14 | DRG: 394 | Disposition: A | Discharge status: Home Health | Payer: Medicare Other | Attending: Internal Medicine | Admitting: Internal Medicine

## 2014-04-11 ENCOUNTER — Encounter (HOSPITAL_COMMUNITY): Payer: Self-pay | Admitting: Emergency Medicine

## 2014-04-11 DIAGNOSIS — M316 Other giant cell arteritis: Secondary | ICD-10-CM | POA: Diagnosis not present

## 2014-04-11 DIAGNOSIS — E872 Acidosis, unspecified: Secondary | ICD-10-CM | POA: Diagnosis present

## 2014-04-11 DIAGNOSIS — Z9049 Acquired absence of other specified parts of digestive tract: Secondary | ICD-10-CM | POA: Diagnosis not present

## 2014-04-11 DIAGNOSIS — M810 Age-related osteoporosis without current pathological fracture: Secondary | ICD-10-CM | POA: Diagnosis present

## 2014-04-11 DIAGNOSIS — Z7951 Long term (current) use of inhaled steroids: Secondary | ICD-10-CM | POA: Diagnosis not present

## 2014-04-11 DIAGNOSIS — I714 Abdominal aortic aneurysm, without rupture, unspecified: Secondary | ICD-10-CM | POA: Diagnosis present

## 2014-04-11 DIAGNOSIS — I719 Aortic aneurysm of unspecified site, without rupture: Secondary | ICD-10-CM

## 2014-04-11 DIAGNOSIS — I129 Hypertensive chronic kidney disease with stage 1 through stage 4 chronic kidney disease, or unspecified chronic kidney disease: Secondary | ICD-10-CM | POA: Diagnosis not present

## 2014-04-11 DIAGNOSIS — Z7952 Long term (current) use of systemic steroids: Secondary | ICD-10-CM | POA: Diagnosis not present

## 2014-04-11 DIAGNOSIS — M353 Polymyalgia rheumatica: Secondary | ICD-10-CM | POA: Diagnosis present

## 2014-04-11 DIAGNOSIS — I10 Essential (primary) hypertension: Secondary | ICD-10-CM

## 2014-04-11 DIAGNOSIS — Z79899 Other long term (current) drug therapy: Secondary | ICD-10-CM

## 2014-04-11 DIAGNOSIS — Z85038 Personal history of other malignant neoplasm of large intestine: Secondary | ICD-10-CM

## 2014-04-11 DIAGNOSIS — E876 Hypokalemia: Secondary | ICD-10-CM | POA: Diagnosis not present

## 2014-04-11 DIAGNOSIS — K4030 Unilateral inguinal hernia, with obstruction, without gangrene, not specified as recurrent: Principal | ICD-10-CM | POA: Diagnosis present

## 2014-04-11 DIAGNOSIS — Z85828 Personal history of other malignant neoplasm of skin: Secondary | ICD-10-CM | POA: Diagnosis not present

## 2014-04-11 DIAGNOSIS — K4090 Unilateral inguinal hernia, without obstruction or gangrene, not specified as recurrent: Secondary | ICD-10-CM | POA: Diagnosis present

## 2014-04-11 DIAGNOSIS — E039 Hypothyroidism, unspecified: Secondary | ICD-10-CM | POA: Diagnosis not present

## 2014-04-11 DIAGNOSIS — N183 Chronic kidney disease, stage 3 unspecified: Secondary | ICD-10-CM | POA: Diagnosis present

## 2014-04-11 DIAGNOSIS — R109 Unspecified abdominal pain: Secondary | ICD-10-CM

## 2014-04-11 DIAGNOSIS — E031 Congenital hypothyroidism without goiter: Secondary | ICD-10-CM

## 2014-04-11 DIAGNOSIS — R1010 Upper abdominal pain, unspecified: Secondary | ICD-10-CM

## 2014-04-11 DIAGNOSIS — K56609 Unspecified intestinal obstruction, unspecified as to partial versus complete obstruction: Secondary | ICD-10-CM

## 2014-04-11 LAB — I-STAT CHEM 8, ED
BUN: 17 mg/dL (ref 6–23)
Calcium, Ion: 1.07 mmol/L — ABNORMAL LOW (ref 1.13–1.30)
Chloride: 111 mEq/L (ref 96–112)
Creatinine, Ser: 0.8 mg/dL (ref 0.50–1.35)
GLUCOSE: 121 mg/dL — AB (ref 70–99)
HCT: 44 % (ref 39.0–52.0)
Hemoglobin: 15 g/dL (ref 13.0–17.0)
Potassium: 2.7 mEq/L — CL (ref 3.7–5.3)
Sodium: 144 mEq/L (ref 137–147)
TCO2: 17 mmol/L (ref 0–100)

## 2014-04-11 LAB — CBC WITH DIFFERENTIAL/PLATELET
BASOS ABS: 0 10*3/uL (ref 0.0–0.1)
Basophils Relative: 0 % (ref 0–1)
Eosinophils Absolute: 0 10*3/uL (ref 0.0–0.7)
Eosinophils Relative: 0 % (ref 0–5)
HCT: 42.5 % (ref 39.0–52.0)
Hemoglobin: 14.1 g/dL (ref 13.0–17.0)
Lymphocytes Relative: 7 % — ABNORMAL LOW (ref 12–46)
Lymphs Abs: 0.7 10*3/uL (ref 0.7–4.0)
MCH: 29.8 pg (ref 26.0–34.0)
MCHC: 33.2 g/dL (ref 30.0–36.0)
MCV: 89.9 fL (ref 78.0–100.0)
Monocytes Absolute: 0.7 10*3/uL (ref 0.1–1.0)
Monocytes Relative: 6 % (ref 3–12)
NEUTROS ABS: 9.8 10*3/uL — AB (ref 1.7–7.7)
Neutrophils Relative %: 87 % — ABNORMAL HIGH (ref 43–77)
Platelets: 160 10*3/uL (ref 150–400)
RBC: 4.73 MIL/uL (ref 4.22–5.81)
RDW: 13.8 % (ref 11.5–15.5)
WBC: 11.2 10*3/uL — AB (ref 4.0–10.5)

## 2014-04-11 LAB — URINALYSIS, ROUTINE W REFLEX MICROSCOPIC
Bilirubin Urine: NEGATIVE
Glucose, UA: NEGATIVE mg/dL
Hgb urine dipstick: NEGATIVE
Ketones, ur: NEGATIVE mg/dL
LEUKOCYTES UA: NEGATIVE
Nitrite: NEGATIVE
Protein, ur: NEGATIVE mg/dL
SPECIFIC GRAVITY, URINE: 1.036 — AB (ref 1.005–1.030)
UROBILINOGEN UA: 0.2 mg/dL (ref 0.0–1.0)
pH: 5.5 (ref 5.0–8.0)

## 2014-04-11 LAB — COMPREHENSIVE METABOLIC PANEL
ALT: 18 U/L (ref 0–53)
AST: 27 U/L (ref 0–37)
Albumin: 3.6 g/dL (ref 3.5–5.2)
Alkaline Phosphatase: 194 U/L — ABNORMAL HIGH (ref 39–117)
Anion gap: 19 — ABNORMAL HIGH (ref 5–15)
BILIRUBIN TOTAL: 0.6 mg/dL (ref 0.3–1.2)
BUN: 17 mg/dL (ref 6–23)
CO2: 17 meq/L — AB (ref 19–32)
Calcium: 8.9 mg/dL (ref 8.4–10.5)
Chloride: 107 mEq/L (ref 96–112)
Creatinine, Ser: 0.8 mg/dL (ref 0.50–1.35)
GFR calc Af Amer: 86 mL/min — ABNORMAL LOW (ref >90)
GFR, EST NON AFRICAN AMERICAN: 74 mL/min — AB (ref >90)
Glucose, Bld: 117 mg/dL — ABNORMAL HIGH (ref 70–99)
POTASSIUM: 2.9 meq/L — AB (ref 3.7–5.3)
Sodium: 143 mEq/L (ref 137–147)
Total Protein: 7.3 g/dL (ref 6.0–8.3)

## 2014-04-11 LAB — BASIC METABOLIC PANEL
Anion gap: 13 (ref 5–15)
BUN: 17 mg/dL (ref 6–23)
CHLORIDE: 109 meq/L (ref 96–112)
CO2: 21 meq/L (ref 19–32)
CREATININE: 0.91 mg/dL (ref 0.50–1.35)
Calcium: 8.5 mg/dL (ref 8.4–10.5)
GFR calc Af Amer: 81 mL/min — ABNORMAL LOW (ref >90)
GFR calc non Af Amer: 70 mL/min — ABNORMAL LOW (ref >90)
Glucose, Bld: 92 mg/dL (ref 70–99)
Potassium: 4.4 mEq/L (ref 3.7–5.3)
Sodium: 143 mEq/L (ref 137–147)

## 2014-04-11 LAB — MAGNESIUM: Magnesium: 1.9 mg/dL (ref 1.5–2.5)

## 2014-04-11 LAB — I-STAT CG4 LACTIC ACID, ED: LACTIC ACID, VENOUS: 3.87 mmol/L — AB (ref 0.5–2.2)

## 2014-04-11 LAB — LIPASE, BLOOD: Lipase: 13 U/L (ref 11–59)

## 2014-04-11 LAB — POC OCCULT BLOOD, ED: Fecal Occult Bld: NEGATIVE

## 2014-04-11 LAB — MRSA PCR SCREENING: MRSA by PCR: NEGATIVE

## 2014-04-11 LAB — LACTIC ACID, PLASMA: Lactic Acid, Venous: 4.2 mmol/L — ABNORMAL HIGH (ref 0.5–2.2)

## 2014-04-11 MED ORDER — IOHEXOL 350 MG/ML SOLN
100.0000 mL | Freq: Once | INTRAVENOUS | Status: AC | PRN
  Administered 2014-04-11: 100 mL via INTRAVENOUS

## 2014-04-11 MED ORDER — ONDANSETRON HCL 4 MG/2ML IJ SOLN
4.0000 mg | Freq: Once | INTRAMUSCULAR | Status: AC
  Administered 2014-04-11: 4 mg via INTRAVENOUS
  Filled 2014-04-11: qty 2

## 2014-04-11 MED ORDER — ONDANSETRON HCL 4 MG/2ML IJ SOLN: 4.0000 mg | Freq: Four times a day (QID) | INTRAMUSCULAR | Status: DC | PRN

## 2014-04-11 MED ORDER — INFLUENZA VAC SPLIT QUAD 0.5 ML IM SUSY
0.5000 mL | PREFILLED_SYRINGE | INTRAMUSCULAR | Status: AC
  Administered 2014-04-12: 0.5 mL via INTRAMUSCULAR
  Filled 2014-04-11: qty 0.5

## 2014-04-11 MED ORDER — SODIUM CHLORIDE 0.9 % IV SOLN
Freq: Once | INTRAVENOUS | Status: AC
  Administered 2014-04-11: 10:00:00 via INTRAVENOUS

## 2014-04-11 MED ORDER — DONEPEZIL HCL 5 MG PO TABS
5.0000 mg | ORAL_TABLET | Freq: Every day | ORAL | Status: DC
  Administered 2014-04-11 – 2014-04-13 (×3): 5 mg via ORAL
  Filled 2014-04-11 (×4): qty 1

## 2014-04-11 MED ORDER — ACETAMINOPHEN 650 MG RE SUPP: 650.0000 mg | Freq: Four times a day (QID) | RECTAL | Status: DC | PRN

## 2014-04-11 MED ORDER — PREDNISONE 5 MG PO TABS
5.0000 mg | ORAL_TABLET | Freq: Two times a day (BID) | ORAL | Status: DC
  Administered 2014-04-11 – 2014-04-14 (×6): 5 mg via ORAL
  Filled 2014-04-11 (×7): qty 1

## 2014-04-11 MED ORDER — FERROUS SULFATE 325 (65 FE) MG PO TABS
325.0000 mg | ORAL_TABLET | Freq: Every day | ORAL | Status: DC
  Administered 2014-04-12: 325 mg via ORAL
  Filled 2014-04-11 (×2): qty 1

## 2014-04-11 MED ORDER — PNEUMOCOCCAL VAC POLYVALENT 25 MCG/0.5ML IJ INJ
0.5000 mL | INJECTION | INTRAMUSCULAR | Status: AC
  Administered 2014-04-12: 0.5 mL via INTRAMUSCULAR
  Filled 2014-04-11: qty 0.5

## 2014-04-11 MED ORDER — SODIUM CHLORIDE 0.9 % IV BOLUS (SEPSIS)
500.0000 mL | Freq: Once | INTRAVENOUS | Status: AC
  Administered 2014-04-11: 500 mL via INTRAVENOUS

## 2014-04-11 MED ORDER — SODIUM CHLORIDE 0.9 % IJ SOLN
3.0000 mL | Freq: Two times a day (BID) | INTRAMUSCULAR | Status: DC
  Administered 2014-04-11 – 2014-04-12 (×3): 3 mL via INTRAVENOUS

## 2014-04-11 MED ORDER — LISINOPRIL 10 MG PO TABS
10.0000 mg | ORAL_TABLET | Freq: Every day | ORAL | Status: DC
  Administered 2014-04-12 – 2014-04-14 (×3): 10 mg via ORAL
  Filled 2014-04-11 (×3): qty 1

## 2014-04-11 MED ORDER — POTASSIUM CHLORIDE 10 MEQ/100ML IV SOLN
10.0000 meq | INTRAVENOUS | Status: AC
  Administered 2014-04-11 (×2): 10 meq via INTRAVENOUS
  Filled 2014-04-11 (×2): qty 100

## 2014-04-11 MED ORDER — SODIUM CHLORIDE 0.9 % IV SOLN
INTRAVENOUS | Status: DC
  Administered 2014-04-11: 100 mL via INTRAVENOUS

## 2014-04-11 MED ORDER — CYANOCOBALAMIN 500 MCG PO TABS
500.0000 ug | ORAL_TABLET | Freq: Every day | ORAL | Status: DC
  Administered 2014-04-11 – 2014-04-14 (×4): 500 ug via ORAL
  Filled 2014-04-11 (×4): qty 1

## 2014-04-11 MED ORDER — MORPHINE SULFATE 2 MG/ML IJ SOLN: 2.0000 mg | INTRAMUSCULAR | Status: DC | PRN

## 2014-04-11 MED ORDER — ACETAMINOPHEN 325 MG PO TABS: 650.0000 mg | ORAL_TABLET | Freq: Four times a day (QID) | ORAL | Status: DC | PRN

## 2014-04-11 MED ORDER — CHOLECALCIFEROL 10 MCG (400 UNIT) PO TABS
400.0000 [IU] | ORAL_TABLET | Freq: Two times a day (BID) | ORAL | Status: DC
  Administered 2014-04-11 – 2014-04-14 (×6): 400 [IU] via ORAL
  Filled 2014-04-11 (×7): qty 1

## 2014-04-11 MED ORDER — LEVOTHYROXINE SODIUM 50 MCG PO TABS
50.0000 ug | ORAL_TABLET | Freq: Every day | ORAL | Status: DC
  Administered 2014-04-12 – 2014-04-14 (×3): 50 ug via ORAL
  Filled 2014-04-11 (×5): qty 1

## 2014-04-11 MED ORDER — HYDROMORPHONE HCL 1 MG/ML IJ SOLN
1.0000 mg | Freq: Once | INTRAMUSCULAR | Status: AC
  Administered 2014-04-11: 1 mg via INTRAVENOUS
  Filled 2014-04-11: qty 1

## 2014-04-11 MED ORDER — ONDANSETRON HCL 4 MG PO TABS: 4.0000 mg | ORAL_TABLET | Freq: Four times a day (QID) | ORAL | Status: DC | PRN

## 2014-04-11 MED ORDER — FLUTICASONE PROPIONATE HFA 44 MCG/ACT IN AERO
2.0000 | INHALATION_SPRAY | Freq: Two times a day (BID) | RESPIRATORY_TRACT | Status: DC
  Administered 2014-04-12 – 2014-04-14 (×3): 2 via RESPIRATORY_TRACT
  Filled 2014-04-11 (×2): qty 10.6

## 2014-04-11 NOTE — ED Notes (Signed)
Corey Walker [phone] or [phone] (daughter & POA)

## 2014-04-11 NOTE — ED Notes (Signed)
Chem 8 results given to Dr. Jacelyn Grip

## 2014-04-11 NOTE — H&P (Addendum)
[facility] History and Physical  Corey Walker KXF:[id number] DOB: 12-Sep-1917 DOA: 04/11/2014  Referring physician: Emergency Department PCP: Redge Gainer, MD  Specialists:   Chief Complaint: Abd pain  HPI: Corey Walker is a 80 y.o. male  With currently treated htn, hypothyroid, hx of colon cancer, AAA, who presents to the ED with abd pain with nausea with diarrhea. In the ED, pt underwent CT abd with findings of a massive L indirect inguinal hernia with marked focal wall thickening with lactate of over 4. Pt was also noted to have enlarging fusiform infrarenal AAA with size of 5.5cm up from 5cm on previous study. Vascular surgery was consulted. Family discussed options and pt wishes elective repair of AAA. General Surgery was also consulted. Pt declined surgery at the bedside. Surgical recs for admission to hospitalist service, bowel rest, IVF.  Review of Systems:   Per above, the remainder of the 10pt ros reviewed and are neg  Past Medical History  Diagnosis Date  . Hypertension   . Polymyalgia rheumatica   . Diverticulosis   . Thyroid disease   . Diarrhea   . Hypokalemia   . Colon cancer 2001  . Skin cancer   . AAA (abdominal aortic aneurysm)     4.6 X 4.9 cm  . Osteoporosis   . BPH (benign prostatic hyperplasia)   . Tinea unguium   . Temporal arteritis   . Obstructive jaundice     2013  . Pancreatitis     2013, 04/2013  . Inguinal hernia, left    Past Surgical History  Procedure Laterality Date  . Colon surgery  2001    right colectomy for colon cancer  . Eus  07/13/2011    Dr. Carol Ada, inadequate conscious sedation. limited views of pancreas. No evidence of choledocholithiasis, CBD 89mm.  . Cholecystectomy    . Ercp N/A 05/21/2013    Procedure: ENDOSCOPIC RETROGRADE CHOLANGIOPANCREATOGRAPHY (ERCP) Sphincterotomy and stone extraction;  Surgeon: Rogene Houston, MD;  Location: [facility];  Service: Endoscopy;  Laterality: N/A;   Social History:  reports  that he has never smoked. He does not have any smokeless tobacco history on file. He reports that he does not drink alcohol or use illicit drugs.  where does patient live--home, ALF, SNF? and with whom if at home?  Can patient participate in ADLs?  Allergies  Allergen Reactions  . Darvocet [Propoxyphene N-Acetaminophen]     Propoxyphene (Per MAR)  . Fish Oil Other (See Comments)    unk  . Lactose Intolerance (Gi) Other (See Comments)    unk   . Loratadine [Loratadine] Other (See Comments)    dizziness (PER MAR)  . Penicillins Swelling    Swelling of hands (per [facility] records)    No family history on file. reviewed and is noncontributory to this particular case (be sure to complete)  Prior to Admission medications   Medication Sig Start Date End Date Taking? Authorizing Provider  anti-nausea (EMETROL) solution Take 5 mLs by mouth every 15 (fifteen) minutes as needed. As needed to stop emesis   Yes Historical Provider, MD  Budesonide (PULMICORT FLEXHALER) 90 MCG/ACT inhaler Inhale 1 puff into the lungs 2 (two) times daily.   Yes Historical Provider, MD  cholecalciferol (VITAMIN D) 400 UNITS TABS Take 400 Units by mouth 2 (two) times daily.   Yes Historical Provider, MD  cyanocobalamin (V-R VITAMIN B-12) 500 MCG tablet Take 1 tablet (500 mcg total) by mouth daily. 09/23/13  Yes Lysbeth Penner,  FNP  donepezil (ARICEPT) 5 MG tablet Take 1 tablet (5 mg total) by mouth at bedtime. 09/23/13  Yes Lysbeth Penner, FNP  feeding supplement (ENSURE IMMUNE HEALTH) LIQD Take 237 mLs by mouth 2 (two) times daily.   Yes Historical Provider, MD  ferrous sulfate 325 (65 FE) MG tablet Take 325 mg by mouth daily with breakfast.   Yes Historical Provider, MD  furosemide (LASIX) 20 MG tablet Take 20 mg by mouth every other day.    Yes Historical Provider, MD  levothyroxine (SYNTHROID, LEVOTHROID) 50 MCG tablet Take 50 mcg by mouth daily.   Yes Historical Provider, MD  lisinopril (PRINIVIL,ZESTRIL) 10 MG  tablet Take 10 mg by mouth daily.   Yes Historical Provider, MD  predniSONE (DELTASONE) 5 MG tablet Take 5 mg by mouth 2 (two) times daily.   Yes Historical Provider, MD  promethazine (PHENERGAN) 12.5 MG tablet Take 12.5 mg by mouth every 8 (eight) hours as needed for nausea.   Yes Historical Provider, MD  azithromycin (ZITHROMAX) 250 MG tablet Take 2 po first day and then one po qd x 4 days Patient not taking: Reported on 04/11/2014 12/02/13   Lysbeth Penner, FNP  trimethoprim-polymyxin b (POLYTRIM) ophthalmic solution Place 1 drop into both eyes every 4 (four) hours. Patient not taking: Reported on 04/11/2014 12/02/13   Lysbeth Penner, FNP   Physical Exam: Filed Vitals:   04/11/14 1015 04/11/14 1115 04/11/14 1145 04/11/14 1230  BP: 129/64 151/88 122/68 120/88  Pulse:    76  Temp:      TempSrc:      Resp: 9 16 12 16   Weight:      SpO2:  94%  100%     General:  Awake, in nad  Eyes: PERRL B  ENT: membranes moist, dentition fair  Neck: trachea midline, neck supple  Cardiovascular: regular, s1, s2  Respiratory: normal resp effort, no wheezing  Abdomen: soft, pos BS, generally tender, midline abd scar  Skin: normal skin turgor, mult areas of ecchymosis  Musculoskeletal: perfused, no clubbing  Psychiatric: mood/affect normal // no auditory/visual hallucinations  Neurologic: cn2-12 intact, strength./sensation intact  Labs on Admission:  Basic Metabolic Panel:  Recent Labs Lab 04/11/14 0843 04/11/14 0909  NA 143 144  K 2.9* 2.7*  CL 107 111  CO2 17*  --   GLUCOSE 117* 121*  BUN 17 17  CREATININE 0.80 0.80  CALCIUM 8.9  --    Liver Function Tests:  Recent Labs Lab 04/11/14 0843  AST 27  ALT 18  ALKPHOS 194*  BILITOT 0.6  PROT 7.3  ALBUMIN 3.6    Recent Labs Lab 04/11/14 0843  LIPASE 13   No results for input(s): AMMONIA in the last 168 hours. CBC:  Recent Labs Lab 04/11/14 0843 04/11/14 0909  WBC 11.2*  --   NEUTROABS 9.8*  --   HGB 14.1  15.0  HCT 42.5 44.0  MCV 89.9  --   PLT 160  --    Cardiac Enzymes: No results for input(s): CKTOTAL, CKMB, CKMBINDEX, TROPONINI in the last 168 hours.  BNP (last 3 results) No results for input(s): PROBNP in the last 8760 hours. CBG: No results for input(s): GLUCAP in the last 168 hours.  Radiological Exams on Admission: Ct Angio Abd/pel W/ And/or W/o  04/11/2014   CLINICAL DATA:  80 year old male with severe upper abdominal pain concerning for mesenteric ischemia.  EXAM: CTA ABDOMEN AND PELVIS wITHOUT AND WITH CONTRAST  TECHNIQUE: Multidetector CT imaging  of the abdomen and pelvis was performed using the standard protocol during bolus administration of intravenous contrast. Multiplanar reconstructed images and MIPs were obtained and reviewed to evaluate the vascular anatomy.  CONTRAST:  160mL OMNIPAQUE IOHEXOL 350 MG/ML SOLN  COMPARISON:  Most recent prior CT abdomen/ pelvis 05/19/2013  FINDINGS: VASCULAR  Aorta: Enlarging fusiform infrarenal abdominal aortic aneurysm. The maximal transverse dimensions are 5.5 x 5.5 cm compared to 5.0 x 4.8 cm on 05/19/2013. The greater than 5 mm of enlargement over less than 1 year is considered relatively rapid interval growth. Additionally, there is been interval change in the partially thrombosed lumen. There is increased enhancement within of previously thrombosed lumen resulting in any thin linear internal flap / synechia. No signs of acute or impending rupture.  Celiac: Widely patent at the origin. Mild fusiform aneurysmal dilatation of the proximal segments to 11 mm. Extensive atherosclerotic vascular calcifications throughout the the splanchnic arteries. No focal splenic or hepatic arterial aneurysm. Conventional hepatic arterial anatomy.  SMA: Focal atherosclerotic plaque at the origin without significant stenosis. The vessel is widely patent distally.  Renals: Single renal arteries bilaterally. The left renal artery is lower than the right. Mild  atherosclerotic plaque at the renal artery origins without evidence of significant stenosis.  IMA: Occluded at the origin. The vessel reconstitutes via retrograde flow distally.  Inflow: Ectatic common iliac arteries bilaterally with scattered atherosclerotic plaque. No focal stenosis. Both internal iliac arteries remain patent. No aneurysmal dilatation. The external iliac arteries are mildly tortuous but relatively spared from disease.  Proximal Outflow: Mild atherosclerotic calcification without stenosis in both common femoral arteries. Visualized proximal superficial and profunda femoral arteries are patent.  Veins: No focal venous abnormality.  NON-VASCULAR  Lower Chest: Mild dependent atelectasis in the lower lobes. Calcification of the mitral valve annulus. The visualized cardiac structures are within normal limits for size. No pericardial effusion. Small hiatal hernia.  Abdomen: Unremarkable appearance of the stomach. Periampullary duodenum diverticulum without evidence of inflammation. Unremarkable appearance of the spleen, adrenal glands and pancreas save for diffuse fatty atrophy. Normal hepatic contours and morphology. No discrete hepatic lesion. Mild diffuse intrahepatic and extrahepatic biliary ductal dilatation has improved compared to 05/19/2013. The main common bile duct measures 11 mm compared to 13 mm previously.  Unremarkable appearance of the bilateral kidneys. No focal solid lesion, hydronephrosis or nephrolithiasis. Stable nearly 6 cm cyst exophytic from the interpolar left kidney. Additional smaller hypo attenuating lesions are too small for accurate characterization but also statistically likely benign cysts. 3 mm nonobstructing stone in the lower pole of the right kidney. No hydronephrosis.  Massive left indirect inguinal hernia containing multiple loops of small bowel. There is been interval change in configuration of the small bowel with a single dilated loop of small bowel with diffuse  wall thickening, and adjacent fluid and inflammatory change in the mesentery. There is a transition point as the bowel enters and leads the hernia sac concerning for closed loop obstruction. Relatively large volume of reactive ascites is present in the inferior aspect of the aneurysm sac.  Extensive sigmoid colonic diverticulosis without evidence of active diverticulitis. Surgical changes suggest prior right hemicolectomy with patent entero colonic anastomosis. Additional loops of the dilated small bowel are presence in the left upper and mid abdomen consistent with obstruction related to the left inguinal hernia.  Pelvis: Small right hydrocele. The massive left inguinal hernia without evidence of closed loop obstruction as described above. Prostatomegaly. Prostate gland measures 5.2 cm in transverse diameter. No  suspicious adenopathy.  Bones/Soft Tissues: No acute fracture or aggressive appearing lytic or blastic osseous lesion. Advanced multilevel degenerative disc disease and lower lumbar facet arthropathy.  Review of the MIP images confirms the above findings.  IMPRESSION: 1. Massive left indirect inguinal hernia complicated by probable closed loop obstruction of a loop of distal small bowel. There is marked focal wall thickening of the affected bowel loops and extensive inflammatory stranding and reactive fluid within the inguinal hernia sac. No evidence of ischemic necrosis or perforation at this time. Recommend urgent surgical consultation. 2. Associated changes of small bowel obstruction in the left upper and mid abdomen. 3. Enlarging fusiform infrarenal abdominal aneurysm currently measuring up to 5.5 cm compared to a maximum of 5.0 cm on 05/19/2013. No evidence of active or impending rupture. Greater than 5 mm growth over the past 12 months is associated with an increased risk of aneurysm rupture. Recommend vascular surgery referral/consultation if not already obtained. 4. Scattered atherosclerotic  vascular disease without focal stenosis. Specifically, no evidence to suggest mesenteric ischemia. 5. Extensive sigmoid colonic diverticulosis without evidence of active diverticulitis. 6. Surgical changes of prior right partial colectomy with patent entero colonic anastomosis. 7. Additional ancillary findings as above without significant interval change. These results were called by telephone at the time of interpretation on 04/11/2014 at 11:57 am to Dr. Pattricia Boss , who verbally acknowledged these results.  Signed,  Criselda Peaches, MD  [facility]  [facility]   Electronically Signed   By: Jacqulynn Cadet M.D.   On: 04/11/2014 11:58    Assessment/Plan Principal Problem:   Lactic acidosis Active Problems:   HTN (hypertension)   Hypothyroidism   AAA (abdominal aortic aneurysm)   Hypokalemia   Essential hypertension   Inguinal hernia with obstruction   1. Lactic acidosis 1. Likely secondary to below 2. Improving from over 4 to upper 3's 3. Cont on IVF as tolerated 4. Follow lactate levels 5. Admit to stepdown 2. Inguinal hernia with obstruction 1. General surgery was consulted and are following 2. Pt had declined surgical intervention in the ED 3. Per surgical recs, bowel rest with IVF for now 3. AAA 1. Vascular surgery consulted are are following 2. Decision was made for elective AAA repair 3. Will follow 4. HTN 1. Stable 2. Cont home meds 5. Hypothyroid 1. Will continue home thyroid replacement 6. DVT prophylaxis 1. SCD's 7. Hypokalemia 1. 43meq given in ED 2. Follow lytes and correct as needed 3. Mg level pending as well  Code Status: Full - pt reports "I just don't want it prolonged." Family Communication: Pt in room  Disposition Plan: Pending  Time spent: 78min  [facility], [facility] Pager [phone]  If 7PM-7AM, please contact night-coverage [email or web address] Password [email or web address] 04/11/2014, 2:10 PM

## 2014-04-11 NOTE — ED Provider Notes (Signed)
CSN: [id number]     Arrival date & time 04/11/14  0806 History   First MD Initiated Contact with Patient 04/11/14 [id number]     Chief Complaint  Patient presents with  . Abdominal Pain     (Consider location/radiation/quality/duration/timing/severity/associated sxs/prior Treatment) Patient is a 80 y.o. male presenting with abdominal pain. The history is provided by the patient. No language interpreter was used.  Abdominal Pain Pain location:  RUQ Pain quality: aching   Pain radiates to:  Does not radiate Pain severity:  Moderate Onset quality:  Sudden Duration:  4 hours Timing:  Constant Progression:  Worsening Chronicity:  New Context: not recent illness, not sick contacts and not trauma   Relieved by:  Nothing Exacerbated by: palpation. Ineffective treatments:  None tried Associated symptoms: diarrhea (chronic), nausea and vomiting   Associated symptoms: no chest pain, no cough, no dysuria, no fever, no hematuria, no shortness of breath and no sore throat   Vomiting:    Quality:  Stomach contents   Number of occurrences:  2   Severity:  Moderate   Duration:  4 hours   Timing:  Intermittent   Progression:  Unchanged Risk factors: being elderly     Past Medical History  Diagnosis Date  . Hypertension   . Polymyalgia rheumatica   . Diverticulosis   . Thyroid disease   . Diarrhea   . Hypokalemia   . Colon cancer 2001  . Skin cancer   . AAA (abdominal aortic aneurysm)     4.6 X 4.9 cm  . Osteoporosis   . BPH (benign prostatic hyperplasia)   . Tinea unguium   . Temporal arteritis   . Obstructive jaundice     2013  . Pancreatitis     2013, 04/2013  . Inguinal hernia, left    Past Surgical History  Procedure Laterality Date  . Colon surgery  2001    right colectomy for colon cancer  . Eus  07/13/2011    Dr. Carol Ada, inadequate conscious sedation. limited views of pancreas. No evidence of choledocholithiasis, CBD 11m.  . Cholecystectomy    . Ercp N/A  05/21/2013    Procedure: ENDOSCOPIC RETROGRADE CHOLANGIOPANCREATOGRAPHY (ERCP) Sphincterotomy and stone extraction;  Surgeon: NRogene Houston MD;  Location: [facility];  Service: Endoscopy;  Laterality: N/A;   No family history on file. History  Substance Use Topics  . Smoking status: Never Smoker   . Smokeless tobacco: Not on file  . Alcohol Use: No    Review of Systems  Constitutional: Negative for fever.  HENT: Negative for congestion, rhinorrhea and sore throat.   Respiratory: Negative for cough and shortness of breath.   Cardiovascular: Negative for chest pain.  Gastrointestinal: Positive for nausea, vomiting, abdominal pain and diarrhea (chronic).  Genitourinary: Negative for dysuria and hematuria.  Skin: Negative for rash.  Neurological: Negative for syncope, light-headedness and headaches.  All other systems reviewed and are negative.     Allergies  Darvocet; Fish oil; Lactose intolerance (gi); Loratadine; and Penicillins  Home Medications   Prior to Admission medications   Medication Sig Start Date End Date Taking? Authorizing Provider  anti-nausea (EMETROL) solution Take 5 mLs by mouth every 15 (fifteen) minutes as needed. As needed to stop emesis    Historical Provider, MD  azithromycin (ZITHROMAX) 250 MG tablet Take 2 po first day and then one po qd x 4 days 12/02/13   WLysbeth Penner FNP  Budesonide (PULMICORT FLEXHALER) 90 MCG/ACT inhaler Inhale 1 puff into  the lungs 2 (two) times daily.    Historical Provider, MD  cholecalciferol (VITAMIN D) 400 UNITS TABS Take 400 Units by mouth 2 (two) times daily.    Historical Provider, MD  cyanocobalamin (V-R VITAMIN B-12) 500 MCG tablet Take 1 tablet (500 mcg total) by mouth daily. 09/23/13   Lysbeth Penner, FNP  donepezil (ARICEPT) 5 MG tablet Take 1 tablet (5 mg total) by mouth at bedtime. 09/23/13   Lysbeth Penner, FNP  ferrous sulfate 325 (65 FE) MG tablet Take 325 mg by mouth daily with breakfast.    Historical Provider,  MD  furosemide (LASIX) 20 MG tablet Take 20 mg by mouth every other day.     Historical Provider, MD  levothyroxine (SYNTHROID, LEVOTHROID) 50 MCG tablet Take 50 mcg by mouth daily.    Historical Provider, MD  lisinopril (PRINIVIL,ZESTRIL) 10 MG tablet Take 10 mg by mouth daily.    Historical Provider, MD  predniSONE (DELTASONE) 5 MG tablet Take 5 mg by mouth 2 (two) times daily.    Historical Provider, MD  promethazine (PHENERGAN) 12.5 MG tablet Take 12.5 mg by mouth every 8 (eight) hours as needed for nausea.    Historical Provider, MD  trimethoprim-polymyxin b (POLYTRIM) ophthalmic solution Place 1 drop into both eyes every 4 (four) hours. 12/02/13   Orson Ape Oxford, FNP   BP 145/79 mmHg  Pulse 64  Resp 22  SpO2 100% Physical Exam  Constitutional: He is oriented to person, place, and time. He appears well-developed and well-nourished.  HENT:  Head: Normocephalic and atraumatic.  Right Ear: External ear normal.  Left Ear: External ear normal.  Eyes: EOM are normal.  Neck: Normal range of motion. Neck supple.  Cardiovascular: Normal rate, regular rhythm and intact distal pulses.  Exam reveals no gallop and no friction rub.   No murmur heard. Pulmonary/Chest: Effort normal and breath sounds normal. No respiratory distress. He has no wheezes. He has no rales. He exhibits no tenderness.  Abdominal: Soft. Bowel sounds are normal. He exhibits no distension. There is tenderness (diffusely). There is no rebound.  Pain out of proportion, large left inguinal hernia  Musculoskeletal: Normal range of motion. He exhibits no edema or tenderness.  Lymphadenopathy:    He has no cervical adenopathy.  Neurological: He is alert and oriented to person, place, and time.  Skin: Skin is warm. No rash noted.  Psychiatric: He has a normal mood and affect. His behavior is normal.  Nursing note and vitals reviewed.   ED Course  Procedures (including critical care time) Labs Review Labs Reviewed  CBC WITH  DIFFERENTIAL - Abnormal; Notable for the following:    WBC 11.2 (*)    Neutrophils Relative % 87 (*)    Neutro Abs 9.8 (*)    Lymphocytes Relative 7 (*)    All other components within normal limits  COMPREHENSIVE METABOLIC PANEL - Abnormal; Notable for the following:    Potassium 2.9 (*)    CO2 17 (*)    Glucose, Bld 117 (*)    Alkaline Phosphatase 194 (*)    GFR calc non Af Amer 74 (*)    GFR calc Af Amer 86 (*)    Anion gap 19 (*)    All other components within normal limits  LACTIC ACID, PLASMA - Abnormal; Notable for the following:    Lactic Acid, Venous 4.2 (*)    All other components within normal limits  URINALYSIS, ROUTINE W REFLEX MICROSCOPIC - Abnormal; Notable for the  following:    Specific Gravity, Urine 1.036 (*)    All other components within normal limits  BASIC METABOLIC PANEL - Abnormal; Notable for the following:    GFR calc non Af Amer 70 (*)    GFR calc Af Amer 81 (*)    All other components within normal limits  I-STAT CHEM 8, ED - Abnormal; Notable for the following:    Potassium 2.7 (*)    Glucose, Bld 121 (*)    Calcium, Ion 1.07 (*)    All other components within normal limits  I-STAT CG4 LACTIC ACID, ED - Abnormal; Notable for the following:    Lactic Acid, Venous 3.87 (*)    All other components within normal limits  LIPASE, BLOOD  MAGNESIUM  POC OCCULT BLOOD, ED    Imaging Review Ct Angio Abd/pel W/ And/or W/o  04/11/2014   CLINICAL DATA:  80 year old male with severe upper abdominal pain concerning for mesenteric ischemia.  EXAM: CTA ABDOMEN AND PELVIS wITHOUT AND WITH CONTRAST  TECHNIQUE: Multidetector CT imaging of the abdomen and pelvis was performed using the standard protocol during bolus administration of intravenous contrast. Multiplanar reconstructed images and MIPs were obtained and reviewed to evaluate the vascular anatomy.  CONTRAST:  122m OMNIPAQUE IOHEXOL 350 MG/ML SOLN  COMPARISON:  Most recent prior CT abdomen/ pelvis 05/19/2013   FINDINGS: VASCULAR  Aorta: Enlarging fusiform infrarenal abdominal aortic aneurysm. The maximal transverse dimensions are 5.5 x 5.5 cm compared to 5.0 x 4.8 cm on 05/19/2013. The greater than 5 mm of enlargement over less than 1 year is considered relatively rapid interval growth. Additionally, there is been interval change in the partially thrombosed lumen. There is increased enhancement within of previously thrombosed lumen resulting in any thin linear internal flap / synechia. No signs of acute or impending rupture.  Celiac: Widely patent at the origin. Mild fusiform aneurysmal dilatation of the proximal segments to 11 mm. Extensive atherosclerotic vascular calcifications throughout the the splanchnic arteries. No focal splenic or hepatic arterial aneurysm. Conventional hepatic arterial anatomy.  SMA: Focal atherosclerotic plaque at the origin without significant stenosis. The vessel is widely patent distally.  Renals: Single renal arteries bilaterally. The left renal artery is lower than the right. Mild atherosclerotic plaque at the renal artery origins without evidence of significant stenosis.  IMA: Occluded at the origin. The vessel reconstitutes via retrograde flow distally.  Inflow: Ectatic common iliac arteries bilaterally with scattered atherosclerotic plaque. No focal stenosis. Both internal iliac arteries remain patent. No aneurysmal dilatation. The external iliac arteries are mildly tortuous but relatively spared from disease.  Proximal Outflow: Mild atherosclerotic calcification without stenosis in both common femoral arteries. Visualized proximal superficial and profunda femoral arteries are patent.  Veins: No focal venous abnormality.  NON-VASCULAR  Lower Chest: Mild dependent atelectasis in the lower lobes. Calcification of the mitral valve annulus. The visualized cardiac structures are within normal limits for size. No pericardial effusion. Small hiatal hernia.  Abdomen: Unremarkable appearance of  the stomach. Periampullary duodenum diverticulum without evidence of inflammation. Unremarkable appearance of the spleen, adrenal glands and pancreas save for diffuse fatty atrophy. Normal hepatic contours and morphology. No discrete hepatic lesion. Mild diffuse intrahepatic and extrahepatic biliary ductal dilatation has improved compared to 05/19/2013. The main common bile duct measures 11 mm compared to 13 mm previously.  Unremarkable appearance of the bilateral kidneys. No focal solid lesion, hydronephrosis or nephrolithiasis. Stable nearly 6 cm cyst exophytic from the interpolar left kidney. Additional smaller hypo attenuating lesions are  too small for accurate characterization but also statistically likely benign cysts. 3 mm nonobstructing stone in the lower pole of the right kidney. No hydronephrosis.  Massive left indirect inguinal hernia containing multiple loops of small bowel. There is been interval change in configuration of the small bowel with a single dilated loop of small bowel with diffuse wall thickening, and adjacent fluid and inflammatory change in the mesentery. There is a transition point as the bowel enters and leads the hernia sac concerning for closed loop obstruction. Relatively large volume of reactive ascites is present in the inferior aspect of the aneurysm sac.  Extensive sigmoid colonic diverticulosis without evidence of active diverticulitis. Surgical changes suggest prior right hemicolectomy with patent entero colonic anastomosis. Additional loops of the dilated small bowel are presence in the left upper and mid abdomen consistent with obstruction related to the left inguinal hernia.  Pelvis: Small right hydrocele. The massive left inguinal hernia without evidence of closed loop obstruction as described above. Prostatomegaly. Prostate gland measures 5.2 cm in transverse diameter. No suspicious adenopathy.  Bones/Soft Tissues: No acute fracture or aggressive appearing lytic or blastic  osseous lesion. Advanced multilevel degenerative disc disease and lower lumbar facet arthropathy.  Review of the MIP images confirms the above findings.  IMPRESSION: 1. Massive left indirect inguinal hernia complicated by probable closed loop obstruction of a loop of distal small bowel. There is marked focal wall thickening of the affected bowel loops and extensive inflammatory stranding and reactive fluid within the inguinal hernia sac. No evidence of ischemic necrosis or perforation at this time. Recommend urgent surgical consultation. 2. Associated changes of small bowel obstruction in the left upper and mid abdomen. 3. Enlarging fusiform infrarenal abdominal aneurysm currently measuring up to 5.5 cm compared to a maximum of 5.0 cm on 05/19/2013. No evidence of active or impending rupture. Greater than 5 mm growth over the past 12 months is associated with an increased risk of aneurysm rupture. Recommend vascular surgery referral/consultation if not already obtained. 4. Scattered atherosclerotic vascular disease without focal stenosis. Specifically, no evidence to suggest mesenteric ischemia. 5. Extensive sigmoid colonic diverticulosis without evidence of active diverticulitis. 6. Surgical changes of prior right partial colectomy with patent entero colonic anastomosis. 7. Additional ancillary findings as above without significant interval change. These results were called by telephone at the time of interpretation on 04/11/2014 at 11:57 am to Dr. Pattricia Boss , who verbally acknowledged these results.  Signed,  Criselda Peaches, MD  [facility]  [facility]   Electronically Signed   By: Jacqulynn Cadet M.D.   On: 04/11/2014 11:58     EKG Interpretation   Date/Time:  Sunday April 11 2014 09:02:38 EST Ventricular Rate:  58 PR Interval:  81 QRS Duration: 96 QT Interval:  481 QTC Calculation: 472 R Axis:   82 Text Interpretation:  Normal sinus rhythm  Non-specific ST-t changes  Confirmed by RAY MD, Andee Poles ([id number]) on 04/11/2014 9:08:10 AM      MDM   Final diagnoses:  Abdominal pain  Small bowel obstruction  Hypokalemia  Aortic aneurysm    8:15 AM Pt is a 80 y.o. male with pertinent PMHX of HTN, AAA, diverticulitis who presents to the ED with sudden onset RUQ abdominal pain at 4AM after eating raisins. No chest pain or shortness of breath. Endorses chronic watery diarrhea non bloody. Endorses 2 episodes of vomiting non bilious non bloody emesis. No fevers. No syncope. No urinary symptoms. Admission in 2014 for RUQ  pain. ERCP for cholesterol stones.  Previous admission for RUQ pain 04/2013. CT scan showed AAA 4.6x4.9cm. Large left inguinal hernia  Concern for mesenteric ischemia versus incarcerated left inguinal hernia. history of previous ERCP and admission for RUQ abdominal pain. No pulsatile mass with equal pulses in both left and right. Plan for CBC, CMP, lactic acid, lipase, stool hemo-occult and EKG  EKG personally reviewed by myself showed NSr with PACs, right axis deviation Rate of 58, PR 54m, QRS 969mQT/QTC 481/47327mright axis, without evidence of new ischemia. Comparison showed similar, indication: RUQ abdominal pain  Review of labs: istat lactic acid: 3.87 CBC: 11.2, H&H 14.1/42.5 CMP: hypokaemia: will start 20 IV. Elevated alk phos Lactic acid: 4.2 Lipase: 13 UA: no evidence of UTI  Copy of living will. No heroic measures if futile or persistent vegatitive state.   Discussed with vascular surgery: patient noted on CTA abdomen pelvis increase in AAA to 5.0x4.8. Vascular surgery at bedside for evaluation  Also noted on CTA abdomen pelvis was concern for closed loop small bowel obstruction in the left inguinal hernia.  No evidence of mesenteric ischemia. Will consult general surgery for evaluation and consult [facility] for admission for hypokalemia  Plan for admission to hospitalist. Patient declines surgery  at this time. Plan per surgery for maximal medical management of high grade bowel obstruction  Mag 1.9  Labs, EKG and imaging reviewed by myself and considered in medical decision making if ordered.  Imaging interpreted by radiology. Pt was discussed with my attending, Dr. RayLinna HoffD 04/11/14 [id number] 04/12/14 1113

## 2014-04-11 NOTE — ED Notes (Signed)
Patient transported to CT 

## 2014-04-11 NOTE — Consult Note (Signed)
[facility] [place] HISTORY AND PHYSICAL   History of Present Illness:  Patient is a 80 y.o. year old male who presents for evaluation of AAA.  Pt with known history of AAA was 4.9 cm diameter 12/14 today on CT 5.5 cm.  Pt complained of abdominal pain earlier today and currently points to lower abdomen and over left lower quadrant hernia.  No back pain.  No syncope. No hypotension.  Pt Alert and oriented good quality of life lives in assisted living plays piano 3 days per week interacts with many friends.  He gets around on a walker.  Other medical problems include hypertension, osteoporosis, polymyalgia rheumatica history of cholelithiasis.  All are stable.  Was also found to have closed loop obstruction on CT in his hernia.    Past Medical History  Diagnosis Date  . Hypertension   . Polymyalgia rheumatica   . Diverticulosis   . Thyroid disease   . Diarrhea   . Hypokalemia   . Colon cancer 2001  . Skin cancer   . AAA (abdominal aortic aneurysm)     4.6 X 4.9 cm  . Osteoporosis   . BPH (benign prostatic hyperplasia)   . Tinea unguium   . Temporal arteritis   . Obstructive jaundice     2013  . Pancreatitis     2013, 04/2013  . Inguinal hernia, left     Past Surgical History  Procedure Laterality Date  . Colon surgery  2001    right colectomy for colon cancer  . Eus  07/13/2011    Dr. Carol Ada, inadequate conscious sedation. limited views of pancreas. No evidence of choledocholithiasis, CBD 68mm.  . Cholecystectomy    . Ercp N/A 05/21/2013    Procedure: ENDOSCOPIC RETROGRADE CHOLANGIOPANCREATOGRAPHY (ERCP) Sphincterotomy and stone extraction;  Surgeon: Rogene Houston, MD;  Location: [facility];  Service: Endoscopy;  Laterality: N/A;    Social History History  Substance Use Topics  . Smoking status: Never Smoker   . Smokeless tobacco: Not on file  . Alcohol Use: No    Family History No family history on file.  Allergies  Allergies  Allergen  Reactions  . Darvocet [Propoxyphene N-Acetaminophen]     Propoxyphene (Per MAR)  . Fish Oil Other (See Comments)    unk  . Lactose Intolerance (Gi) Other (See Comments)    unk   . Loratadine [Loratadine] Other (See Comments)    dizziness (PER MAR)  . Penicillins Swelling    Swelling of hands (per Echart records)     Current Facility-Administered Medications  Medication Dose Route Frequency Provider Last Rate Last Dose  . potassium chloride 10 mEq in 100 mL IVPB  10 mEq Intravenous Q1 Hr x 2 Joanell Rising, MD 100 mL/hr at 04/11/14 1238 10 mEq at 04/11/14 1238   Current Outpatient Prescriptions  Medication Sig Dispense Refill  . anti-nausea (EMETROL) solution Take 5 mLs by mouth every 15 (fifteen) minutes as needed. As needed to stop emesis    . Budesonide (PULMICORT FLEXHALER) 90 MCG/ACT inhaler Inhale 1 puff into the lungs 2 (two) times daily.    . cholecalciferol (VITAMIN D) 400 UNITS TABS Take 400 Units by mouth 2 (two) times daily.    . cyanocobalamin (V-R VITAMIN B-12) 500 MCG tablet Take 1 tablet (500 mcg total) by mouth daily. 30 tablet 11  . donepezil (ARICEPT) 5 MG tablet Take 1 tablet (5 mg total) by mouth at bedtime. 30 tablet 11  . feeding  supplement (ENSURE IMMUNE HEALTH) LIQD Take 237 mLs by mouth 2 (two) times daily.    . ferrous sulfate 325 (65 FE) MG tablet Take 325 mg by mouth daily with breakfast.    . furosemide (LASIX) 20 MG tablet Take 20 mg by mouth every other day.     . levothyroxine (SYNTHROID, LEVOTHROID) 50 MCG tablet Take 50 mcg by mouth daily.    Marland Kitchen lisinopril (PRINIVIL,ZESTRIL) 10 MG tablet Take 10 mg by mouth daily.    . predniSONE (DELTASONE) 5 MG tablet Take 5 mg by mouth 2 (two) times daily.    . promethazine (PHENERGAN) 12.5 MG tablet Take 12.5 mg by mouth every 8 (eight) hours as needed for nausea.    Marland Kitchen azithromycin (ZITHROMAX) 250 MG tablet Take 2 po first day and then one po qd x 4 days (Patient not taking: Reported on 04/11/2014) 6 tablet 0   . trimethoprim-polymyxin b (POLYTRIM) ophthalmic solution Place 1 drop into both eyes every 4 (four) hours. (Patient not taking: Reported on 04/11/2014) 10 mL 0    ROS:   General:  + weight loss,no  Fever, no chills  HEENT: No recent headaches, no nasal bleeding, no visual changes, no sore throat  Neurologic: No dizziness, blackouts, seizures. No recent symptoms of stroke or mini- stroke. No recent episodes of slurred speech, or temporary blindness.  Cardiac: No recent episodes of chest pain/pressure, no shortness of breath at rest.  + shortness of breath with exertion.  Denies history of atrial fibrillation or irregular heartbeat  Vascular: No history of rest pain in feet.  No history of claudication.  No history of non-healing ulcer, No history of DVT   Pulmonary: No home oxygen, no productive cough, no hemoptysis,  No asthma or wheezing  Musculoskeletal:  [x ] Arthritis, [ ]  Low back pain,  [x ] Joint pain  Hematologic:No history of hypercoagulable state.  No history of easy bleeding.  No history of anemia  Gastrointestinal: No hematochezia or melena,  No gastroesophageal reflux, no trouble swallowing  Urinary: [ ]  chronic Kidney disease, [ ]  on HD - [ ]  MWF or [ ]  TTHS, [ ]  Burning with urination, [ ]  Frequent urination, [ ]  Difficulty urinating;   Skin: No rashes  Psychological: No history of anxiety,  No history of depression   Physical Examination  Filed Vitals:   04/11/14 1015 04/11/14 1115 04/11/14 1145 04/11/14 1230  BP: 129/64 151/88 122/68 120/88  Pulse:    76  Temp:      TempSrc:      Resp: 9 16 12 16   Weight:      SpO2:  94%  100%    Body mass index is 13.95 kg/(m^2).  General:  Alert and oriented, no acute distress HEENT: Normal Neck: No JVD Pulmonary: Clear to auscultation bilaterally Cardiac: Regular Rate and Rhythm  Abdomen: slightly distended, large incarcerated hernia left lower quadrant mildly tender, vaguely palpable aortic pulsation in  epigastrium Skin: No rash, multiple areas of ecchymosis on arms legs Extremity Pulses:  2+ radial, brachial, femoral, 2+ right dorsalis pedis,absent left DP absent bilat posterior tibial pulses bilaterally Musculoskeletal: No deformity or edema  Neurologic: Upper and lower extremity motor 5/5 and symmetric  DATA:  CBC    Component Value Date/Time   WBC 11.2* 04/11/2014 0843   WBC 6.1 09/23/2013 1739   WBC 8.1 03/05/2013 1601   RBC 4.73 04/11/2014 0843   RBC 4.78 09/23/2013 1739   RBC 5.2 03/05/2013 1601   HGB  15.0 04/11/2014 0909   HGB 15.6 03/05/2013 1601   HCT 44.0 04/11/2014 0909   HCT 47.0 03/05/2013 1601   PLT 160 04/11/2014 0843   MCV 89.9 04/11/2014 0843   MCV 90.1 03/05/2013 1601   MCH 29.8 04/11/2014 0843   MCH 30.5 09/23/2013 1739   MCH 29.9 03/05/2013 1601   MCHC 33.2 04/11/2014 0843   MCHC 33.0 09/23/2013 1739   MCHC 33.2 03/05/2013 1601   RDW 13.8 04/11/2014 0843   RDW 13.7 09/23/2013 1739   LYMPHSABS 0.7 04/11/2014 0843   LYMPHSABS 0.5* 09/23/2013 1739   MONOABS 0.7 04/11/2014 0843   EOSABS 0.0 04/11/2014 0843   EOSABS 0.0 09/23/2013 1739   BASOSABS 0.0 04/11/2014 0843   BASOSABS 0.0 09/23/2013 1739    BMET    Component Value Date/Time   NA 144 04/11/2014 0909   NA 143 09/23/2013 1419   K 2.7* 04/11/2014 0909   CL 111 04/11/2014 0909   CO2 17* 04/11/2014 0843   GLUCOSE 121* 04/11/2014 0909   GLUCOSE 120* 09/23/2013 1419   BUN 17 04/11/2014 0909   BUN 16 09/23/2013 1419   CREATININE 0.80 04/11/2014 0909   CALCIUM 8.9 04/11/2014 0843   GFRNONAA 74* 04/11/2014 0843   GFRAA 86* 04/11/2014 0843   CTabdomen images reviewed.  Pt has adequate 19-20 mm neck for stent graft repair with adequate access from iliacs.  Infrarenal AAA 5.5 cm no evidence of rupture, large left ing hernia with dilated bowel, mild left renal stenosis, no significant mesenteric stenosis    ASSESSMENT: 5.5 cm AAA non ruptured at size to consider repair but hernia is most likely  cause of his pain currently   PLAN:  Discussed with pt and his daughter Miachel Roux who is his POA.  Discussed with them elective stent graft repair of AAA.  Risk of perioperative cardiac or pulmonary issues 5%.  They wish to consider elective repair.  We will defer this until his hernia has been addressed.  Current risk of rupture approx 5% per year.  Will follow as consult.  [name], MD [facility] [place] Office: [phone] Pager: [phone]

## 2014-04-11 NOTE — ED Notes (Signed)
Lactic acid results given to Dr. Jacelyn Grip

## 2014-04-11 NOTE — ED Notes (Signed)
Received pt from [facility] with c/o upper abdominal pain after eating raisins. Pt given 4 mg of Zofran by EMS. Pt has history of AAA and diverticulitis.

## 2014-04-11 NOTE — Consult Note (Signed)
Reason for Consult:SBO  Referring Physician: Sharmon Leyden  Corey Walker is an 80 y.o. male.  HPI: Corey Walker was in his usual state of health at the assisted living facility where he lives when he developed severe abdominal pain early this morning centered around a long-standing left inguinal hernia. He had 2 e/o emesis. Evaluation in the ED resulted in a CT scan that showed a possible closed loop SBO in the hernia. He had one dose of diladid over 3 hours ago and is feeling much better. He has had diarrhea but this is a chronic problem for him.   Past Medical History  Diagnosis Date  . Hypertension   . Polymyalgia rheumatica   . Diverticulosis   . Thyroid disease   . Diarrhea   . Hypokalemia   . Colon cancer 2001  . Skin cancer   . AAA (abdominal aortic aneurysm)     4.6 X 4.9 cm  . Osteoporosis   . BPH (benign prostatic hyperplasia)   . Tinea unguium   . Temporal arteritis   . Obstructive jaundice     2013  . Pancreatitis     2013, 04/2013  . Inguinal hernia, left     Past Surgical History  Procedure Laterality Date  . Colon surgery  2001    right colectomy for colon cancer  . Eus  07/13/2011    Dr. Carol Ada, inadequate conscious sedation. limited views of pancreas. No evidence of choledocholithiasis, CBD 63m.  . Cholecystectomy    . Ercp N/A 05/21/2013    Procedure: ENDOSCOPIC RETROGRADE CHOLANGIOPANCREATOGRAPHY (ERCP) Sphincterotomy and stone extraction;  Surgeon: NRogene Houston MD;  Location: [facility];  Service: Endoscopy;  Laterality: N/A;    No family history on file.  Social History:  reports that he has never smoked. He does not have any smokeless tobacco history on file. He reports that he does not drink alcohol or use illicit drugs.  Allergies:  Allergies  Allergen Reactions  . Darvocet [Propoxyphene N-Acetaminophen]     Propoxyphene (Per MAR)  . Fish Oil Other (See Comments)    unk  . Lactose Intolerance (Gi) Other (See Comments)    unk   . Loratadine  [Loratadine] Other (See Comments)    dizziness (PER MAR)  . Penicillins Swelling    Swelling of hands (per Echart records)    Medications: I have reviewed the patient's current medications.  Results for orders placed or performed during the hospital encounter of 04/11/14 (from the past 48 hour(s))  POC occult blood, ED RN will collect     Status: None   Collection Time: 04/11/14  8:42 AM  Result Value Ref Range   Fecal Occult Bld NEGATIVE NEGATIVE  CBC WITH DIFFERENTIAL     Status: Abnormal   Collection Time: 04/11/14  8:43 AM  Result Value Ref Range   WBC 11.2 (H) 4.0 - 10.5 K/uL   RBC 4.73 4.22 - 5.81 MIL/uL   Hemoglobin 14.1 13.0 - 17.0 g/dL   HCT 42.5 39.0 - 52.0 %   MCV 89.9 78.0 - 100.0 fL   MCH 29.8 26.0 - 34.0 pg   MCHC 33.2 30.0 - 36.0 g/dL   RDW 13.8 11.5 - 15.5 %   Platelets 160 150 - 400 K/uL   Neutrophils Relative % 87 (H) 43 - 77 %   Neutro Abs 9.8 (H) 1.7 - 7.7 K/uL   Lymphocytes Relative 7 (L) 12 - 46 %   Lymphs Abs 0.7 0.7 - 4.0 K/uL  Monocytes Relative 6 3 - 12 %   Monocytes Absolute 0.7 0.1 - 1.0 K/uL   Eosinophils Relative 0 0 - 5 %   Eosinophils Absolute 0.0 0.0 - 0.7 K/uL   Basophils Relative 0 0 - 1 %   Basophils Absolute 0.0 0.0 - 0.1 K/uL  Comprehensive metabolic panel     Status: Abnormal   Collection Time: 04/11/14  8:43 AM  Result Value Ref Range   Sodium 143 137 - 147 mEq/L   Potassium 2.9 (LL) 3.7 - 5.3 mEq/L    Comment: CRITICAL RESULT CALLED TO, READ BACK BY AND VERIFIED WITH: REID K.,RN 11.15.15 1013 BY JONESJ    Chloride 107 96 - 112 mEq/L   CO2 17 (L) 19 - 32 mEq/L   Glucose, Bld 117 (H) 70 - 99 mg/dL   BUN 17 6 - 23 mg/dL   Creatinine, Ser 0.80 0.50 - 1.35 mg/dL   Calcium 8.9 8.4 - 10.5 mg/dL   Total Protein 7.3 6.0 - 8.3 g/dL   Albumin 3.6 3.5 - 5.2 g/dL   AST 27 0 - 37 U/L   ALT 18 0 - 53 U/L   Alkaline Phosphatase 194 (H) 39 - 117 U/L   Total Bilirubin 0.6 0.3 - 1.2 mg/dL   GFR calc non Af Amer 74 (L) >90 mL/min   GFR  calc Af Amer 86 (L) >90 mL/min    Comment: (NOTE) The eGFR has been calculated using the CKD EPI equation. This calculation has not been validated in all clinical situations. eGFR's persistently <90 mL/min signify possible Chronic Kidney Disease.    Anion gap 19 (H) 5 - 15  Lactic acid, plasma     Status: Abnormal   Collection Time: 04/11/14  8:43 AM  Result Value Ref Range   Lactic Acid, Venous 4.2 (H) 0.5 - 2.2 mmol/L  Lipase, blood     Status: None   Collection Time: 04/11/14  8:43 AM  Result Value Ref Range   Lipase 13 11 - 59 U/L  I-Stat CG4 Lactic Acid, ED     Status: Abnormal   Collection Time: 04/11/14  9:03 AM  Result Value Ref Range   Lactic Acid, Venous 3.87 (H) 0.5 - 2.2 mmol/L  I-Stat Chem 8, ED     Status: Abnormal   Collection Time: 04/11/14  9:09 AM  Result Value Ref Range   Sodium 144 137 - 147 mEq/L   Potassium 2.7 (LL) 3.7 - 5.3 mEq/L   Chloride 111 96 - 112 mEq/L   BUN 17 6 - 23 mg/dL   Creatinine, Ser 0.80 0.50 - 1.35 mg/dL   Glucose, Bld 121 (H) 70 - 99 mg/dL   Calcium, Ion 1.07 (L) 1.13 - 1.30 mmol/L   TCO2 17 0 - 100 mmol/L   Hemoglobin 15.0 13.0 - 17.0 g/dL   HCT 44.0 39.0 - 52.0 %   Comment NOTIFIED PHYSICIAN   Urinalysis with microscopic     Status: Abnormal   Collection Time: 04/11/14 11:26 AM  Result Value Ref Range   Color, Urine YELLOW YELLOW   APPearance CLEAR CLEAR   Specific Gravity, Urine 1.036 (H) 1.005 - 1.030   pH 5.5 5.0 - 8.0   Glucose, UA NEGATIVE NEGATIVE mg/dL   Hgb urine dipstick NEGATIVE NEGATIVE   Bilirubin Urine NEGATIVE NEGATIVE   Ketones, ur NEGATIVE NEGATIVE mg/dL   Protein, ur NEGATIVE NEGATIVE mg/dL   Urobilinogen, UA 0.2 0.0 - 1.0 mg/dL   Nitrite NEGATIVE NEGATIVE   Leukocytes,  UA NEGATIVE NEGATIVE    Comment: MICROSCOPIC NOT DONE ON URINES WITH NEGATIVE PROTEIN, BLOOD, LEUKOCYTES, NITRITE, OR GLUCOSE <1000 mg/dL.    Ct Angio Abd/pel W/ And/or W/o  04/11/2014   CLINICAL DATA:  80 year old male with severe  upper abdominal pain concerning for mesenteric ischemia.  EXAM: CTA ABDOMEN AND PELVIS wITHOUT AND WITH CONTRAST  TECHNIQUE: Multidetector CT imaging of the abdomen and pelvis was performed using the standard protocol during bolus administration of intravenous contrast. Multiplanar reconstructed images and MIPs were obtained and reviewed to evaluate the vascular anatomy.  CONTRAST:  126m OMNIPAQUE IOHEXOL 350 MG/ML SOLN  COMPARISON:  Most recent prior CT abdomen/ pelvis 05/19/2013  FINDINGS: VASCULAR  Aorta: Enlarging fusiform infrarenal abdominal aortic aneurysm. The maximal transverse dimensions are 5.5 x 5.5 cm compared to 5.0 x 4.8 cm on 05/19/2013. The greater than 5 mm of enlargement over less than 1 year is considered relatively rapid interval growth. Additionally, there is been interval change in the partially thrombosed lumen. There is increased enhancement within of previously thrombosed lumen resulting in any thin linear internal flap / synechia. No signs of acute or impending rupture.  Celiac: Widely patent at the origin. Mild fusiform aneurysmal dilatation of the proximal segments to 11 mm. Extensive atherosclerotic vascular calcifications throughout the the splanchnic arteries. No focal splenic or hepatic arterial aneurysm. Conventional hepatic arterial anatomy.  SMA: Focal atherosclerotic plaque at the origin without significant stenosis. The vessel is widely patent distally.  Renals: Single renal arteries bilaterally. The left renal artery is lower than the right. Mild atherosclerotic plaque at the renal artery origins without evidence of significant stenosis.  IMA: Occluded at the origin. The vessel reconstitutes via retrograde flow distally.  Inflow: Ectatic common iliac arteries bilaterally with scattered atherosclerotic plaque. No focal stenosis. Both internal iliac arteries remain patent. No aneurysmal dilatation. The external iliac arteries are mildly tortuous but relatively spared from  disease.  Proximal Outflow: Mild atherosclerotic calcification without stenosis in both common femoral arteries. Visualized proximal superficial and profunda femoral arteries are patent.  Veins: No focal venous abnormality.  NON-VASCULAR  Lower Chest: Mild dependent atelectasis in the lower lobes. Calcification of the mitral valve annulus. The visualized cardiac structures are within normal limits for size. No pericardial effusion. Small hiatal hernia.  Abdomen: Unremarkable appearance of the stomach. Periampullary duodenum diverticulum without evidence of inflammation. Unremarkable appearance of the spleen, adrenal glands and pancreas save for diffuse fatty atrophy. Normal hepatic contours and morphology. No discrete hepatic lesion. Mild diffuse intrahepatic and extrahepatic biliary ductal dilatation has improved compared to 05/19/2013. The main common bile duct measures 11 mm compared to 13 mm previously.  Unremarkable appearance of the bilateral kidneys. No focal solid lesion, hydronephrosis or nephrolithiasis. Stable nearly 6 cm cyst exophytic from the interpolar left kidney. Additional smaller hypo attenuating lesions are too small for accurate characterization but also statistically likely benign cysts. 3 mm nonobstructing stone in the lower pole of the right kidney. No hydronephrosis.  Massive left indirect inguinal hernia containing multiple loops of small bowel. There is been interval change in configuration of the small bowel with a single dilated loop of small bowel with diffuse wall thickening, and adjacent fluid and inflammatory change in the mesentery. There is a transition point as the bowel enters and leads the hernia sac concerning for closed loop obstruction. Relatively large volume of reactive ascites is present in the inferior aspect of the aneurysm sac.  Extensive sigmoid colonic diverticulosis without evidence of active diverticulitis. Surgical  changes suggest prior right hemicolectomy with  patent entero colonic anastomosis. Additional loops of the dilated small bowel are presence in the left upper and mid abdomen consistent with obstruction related to the left inguinal hernia.  Pelvis: Small right hydrocele. The massive left inguinal hernia without evidence of closed loop obstruction as described above. Prostatomegaly. Prostate gland measures 5.2 cm in transverse diameter. No suspicious adenopathy.  Bones/Soft Tissues: No acute fracture or aggressive appearing lytic or blastic osseous lesion. Advanced multilevel degenerative disc disease and lower lumbar facet arthropathy.  Review of the MIP images confirms the above findings.  IMPRESSION: 1. Massive left indirect inguinal hernia complicated by probable closed loop obstruction of a loop of distal small bowel. There is marked focal wall thickening of the affected bowel loops and extensive inflammatory stranding and reactive fluid within the inguinal hernia sac. No evidence of ischemic necrosis or perforation at this time. Recommend urgent surgical consultation. 2. Associated changes of small bowel obstruction in the left upper and mid abdomen. 3. Enlarging fusiform infrarenal abdominal aneurysm currently measuring up to 5.5 cm compared to a maximum of 5.0 cm on 05/19/2013. No evidence of active or impending rupture. Greater than 5 mm growth over the past 12 months is associated with an increased risk of aneurysm rupture. Recommend vascular surgery referral/consultation if not already obtained. 4. Scattered atherosclerotic vascular disease without focal stenosis. Specifically, no evidence to suggest mesenteric ischemia. 5. Extensive sigmoid colonic diverticulosis without evidence of active diverticulitis. 6. Surgical changes of prior right partial colectomy with patent entero colonic anastomosis. 7. Additional ancillary findings as above without significant interval change. These results were called by telephone at the time of interpretation on  04/11/2014 at 11:57 am to Dr. Pattricia Boss , who verbally acknowledged these results.  Signed,  Criselda Peaches, MD  [facility]  [facility]   Electronically Signed   By: Jacqulynn Cadet M.D.   On: 04/11/2014 11:58    Review of Systems  Constitutional: Negative for fever and chills.  Gastrointestinal: Positive for nausea, vomiting, abdominal pain and diarrhea. Negative for constipation, blood in stool and melena.  All other systems reviewed and are negative.  Blood pressure 120/88, pulse 76, temperature 96.7 F (35.9 C), temperature source Rectal, resp. rate 16, weight 100 lb (45.36 kg), SpO2 100 %. Physical Exam  Constitutional: He appears well-developed.  HENT:  Head: Normocephalic and atraumatic.  Eyes: Conjunctivae are normal. Right eye exhibits no discharge. Left eye exhibits no discharge. No scleral icterus.  Neck: Normal range of motion.  Cardiovascular: Normal rate, regular rhythm and normal heart sounds.  Exam reveals no gallop and no friction rub.   No murmur heard. Respiratory: Effort normal. No respiratory distress. He has no wheezes. He has no rales.  GI: Soft. Bowel sounds are normal. There is no rebound and no guarding. A hernia (large left inguinal hernia, mild TTP) is present. Hernia confirmed positive in the left inguinal area.  Genitourinary: Penis normal.  Musculoskeletal: Normal range of motion.  Neurological: He is alert.  Skin: Skin is warm and dry.  Psychiatric: He has a normal mood and affect. His behavior is normal.    Assessment/Plan: SBO -- Symptomatically this seems to have resolved. Would recommend observation with bowel rest then standard advancement of diet. He has declined surgery at this point. We will follow along with IM who will admit. AAA -- Seen by [facility] Multiple medical problems -- per IM    Lisette Abu, PA-C Pager:  [phone] 04/11/2014, 1:10 PM

## 2014-04-12 DIAGNOSIS — N183 Chronic kidney disease, stage 3 unspecified: Secondary | ICD-10-CM | POA: Diagnosis present

## 2014-04-12 DIAGNOSIS — K4030 Unilateral inguinal hernia, with obstruction, without gangrene, not specified as recurrent: Secondary | ICD-10-CM | POA: Diagnosis not present

## 2014-04-12 LAB — COMPREHENSIVE METABOLIC PANEL
ALBUMIN: 2.6 g/dL — AB (ref 3.5–5.2)
ALK PHOS: 147 U/L — AB (ref 39–117)
ALT: 13 U/L (ref 0–53)
AST: 20 U/L (ref 0–37)
Anion gap: 12 (ref 5–15)
BILIRUBIN TOTAL: 0.9 mg/dL (ref 0.3–1.2)
BUN: 18 mg/dL (ref 6–23)
CHLORIDE: 112 meq/L (ref 96–112)
CO2: 19 mEq/L (ref 19–32)
Calcium: 8.4 mg/dL (ref 8.4–10.5)
Creatinine, Ser: 0.99 mg/dL (ref 0.50–1.35)
GFR calc Af Amer: 79 mL/min — ABNORMAL LOW (ref >90)
GFR calc non Af Amer: 68 mL/min — ABNORMAL LOW (ref >90)
GLUCOSE: 91 mg/dL (ref 70–99)
POTASSIUM: 3.7 meq/L (ref 3.7–5.3)
SODIUM: 143 meq/L (ref 137–147)
Total Protein: 5.6 g/dL — ABNORMAL LOW (ref 6.0–8.3)

## 2014-04-12 LAB — CBC
HCT: 38.8 % — ABNORMAL LOW (ref 39.0–52.0)
Hemoglobin: 12.5 g/dL — ABNORMAL LOW (ref 13.0–17.0)
MCH: 29.4 pg (ref 26.0–34.0)
MCHC: 32.2 g/dL (ref 30.0–36.0)
MCV: 91.3 fL (ref 78.0–100.0)
Platelets: 132 10*3/uL — ABNORMAL LOW (ref 150–400)
RBC: 4.25 MIL/uL (ref 4.22–5.81)
RDW: 14.2 % (ref 11.5–15.5)
WBC: 8.6 10*3/uL (ref 4.0–10.5)

## 2014-04-12 LAB — LACTIC ACID, PLASMA: Lactic Acid, Venous: 1 mmol/L (ref 0.5–2.2)

## 2014-04-12 NOTE — Care Management Note (Addendum)
    Page 1 of 1   04/13/2014     8:12:39 AM CARE MANAGEMENT NOTE 04/13/2014  Patient:  Corey Walker, Corey Walker   Account Number:  [id number]  Date Initiated:  04/12/2014  Documentation initiated by:  Elissa Hefty  Subjective/Objective Assessment:   adm w bowel obstruction     Action/Plan:   from alf per chart, pcp dr don Laurance Flatten   Anticipated DC Date:     Anticipated DC Plan:  ASSISTED LIVING / REST HOME  In-house referral  Clinical Social Worker         Choice offered to / List presented to:             Status of service:   Medicare Important Message given?  YES (If response is "NO", the following Medicare IM given date fields will be blank) Date Medicare IM given:  04/13/2014 Medicare IM given by:  Magdalen Spatz Date Additional Medicare IM given:   Additional Medicare IM given by:    Discharge Disposition:    Per UR Regulation:  Reviewed for med. necessity/level of care/duration of stay  If discussed at [place] of Stay Meetings, dates discussed:    Comments:

## 2014-04-12 NOTE — Consult Note (Signed)
[facility]  Subjective  - Feels better today   Objective 93/59 57 97.9 F (36.6 C) (Oral) 19 100%  Intake/Output Summary (Last 24 hours) at 04/12/14 1628 Last data filed at 04/12/14 1627  Gross per 24 hour  Intake   1930 ml  Output    652 ml  Net   1278 ml   Abdomen non tender  Assessment/Planning: AAA stable not symptomatic Pt measured for Gore Excluder Stent graft today and will be able to percutaneously stent Will need cardiac risk assessment.  This can be done as in or outpt.  Will plan to do repair in a few weeks if no further issues with hernia.  Jackolyn Geron E 04/12/2014 4:28 PM --  Laboratory Lab Results:  Recent Labs  04/11/14 0843 04/11/14 0909 04/12/14 0221  WBC 11.2*  --  8.6  HGB 14.1 15.0 12.5*  HCT 42.5 44.0 38.8*  PLT 160  --  132*   BMET  Recent Labs  04/11/14 1343 04/12/14 0221  NA 143 143  K 4.4 3.7  CL 109 112  CO2 21 19  GLUCOSE 92 91  BUN 17 18  CREATININE 0.91 0.99  CALCIUM 8.5 8.4    COAG Lab Results  Component Value Date   INR 0.99 05/21/2013   INR 1.01 07/12/2011   No results found for: PTT

## 2014-04-12 NOTE — Progress Notes (Signed)
[name]  Corey Walker JSE:[id number] DOB: 04-04-1918 DOA: 04/11/2014 PCP: Redge Gainer, MD   Brief narrative: 80 year old male patient with known history of hypertension, hypothyroidism, colon cancer, and abdominal aortic aneurysm who presented to the emergency department with abdominal pain associated with nausea and diarrhea.  In the ER CT of the abdomen demonstrated a massive left indirect inguinal hernia with marked focal wall thickening. This was associated with a serum lactate of greater than 4. There was also an incidental finding of enlargement of the known fusiform infrarenal AAA with size increased from 5 cm to 5.5 cm. While in the emergency department Vascular Surgery was consulted. The EDP discussed with the family and the family discussed amongst themselves and reported that the patient wished elective repair of the abdominal aortic aneurysm. Because of the abnormal findings on CT regarding the hernia Gen Surgery was consulted, but the pt himself made it clear to the surgeon he did not desire to have surgery. Patient was admitted for bowel rest and symptomatic management.  Since admission patient's lactate has normalized, he's been rehydrated, nausea and vomiting has resolved. The hernia has self reduced. Gen. Surgery is recommending conservative management at this point. Diet is slowly being advanced.  HPI/Subjective: Alert, very hard of hearing, says he is hungry. He was very concerned about his presenting symptoms stating that he was unable to eat for 2 days and had become very dehydrated.  Assessment/Plan:    AAA (abdominal aortic aneurysm) Subtle increase in size over the past 12 months-per [facility] note: "Discussed with pt and his daughter Miachel Roux who is his POA. Discussed with them elective stent graft repair of AAA. Risk of perioperative cardiac or pulmonary issues 5%. They wish to consider elective repair. We will defer  this until his hernia has been addressed. Current risk of rupture approx 5% per year."    Lactic acidosis Resolved-continue IV fluid until proves appropriate oral intake    Inguinal hernia with obstruction Appreciate general surgery assistance-hernia has self reduced and patient currently symptom free-slow advance of diet noting likely needs to be observed for at least 24 hours post reintroduction of solids to ensure no recurrence of hernia incarceration-prevention of constipation primary mode of limiting hernia incarceration from a conservative standpoint    HTN  BP soft-cont IVFs-hold home meds (ACE I)    Hypokalemia Likely from preadmission N/V and poor intake- replete prn- follow labs    Hypothyroidism Cont Synthroid    CKD (chronic kidney disease), stage III Baseline renal function 16/1.09   history temporal arteritis On chronic prednisone  DVT prophylaxis: SCDs Code Status: full  Family Communication: no family at bedside Disposition Plan/Expected LOS: transfer to floor  Consultants: [facility] Gen. Surgery  Procedures: none  Cultures: none  Antibiotics: none  Objective: Blood pressure 101/62, pulse 70, temperature 97.6 F (36.4 C), temperature source Oral, resp. rate 17, height 5\' 7"  (1.702 m), weight 112 lb 10.5 oz (51.1 kg), SpO2 100 %.  Intake/Output Summary (Last 24 hours) at 04/12/14 1427 Last data filed at 04/12/14 1100  Gross per 24 hour  Intake   1930 ml  Output    551 ml  Net   1379 ml   Exam: Gen: No acute respiratory distress-very hard of hearing-cachectic; friable skin Chest: Clear to auscultation bilaterally without wheezes, rhonchi or crackles, room air Cardiac: Regular rate and rhythm, no rubs murmurs or gallops, no peripheral edema, no JVD Abdomen: Soft nontender nondistended  without obvious hepatosplenomegaly, no ascites Extremities: Symmetrical in appearance without cyanosis, clubbing or effusion  Scheduled Meds:  Scheduled Meds: .  cholecalciferol  400 Units Oral BID  . cyanocobalamin  500 mcg Oral Daily  . donepezil  5 mg Oral QHS  . ferrous sulfate  325 mg Oral Q breakfast  . fluticasone  2 puff Inhalation BID  . levothyroxine  50 mcg Oral QAC breakfast  . lisinopril  10 mg Oral Daily  . predniSONE  5 mg Oral BID  . sodium chloride  3 mL Intravenous Q12H   Data Reviewed: Basic Metabolic Panel:  Recent Labs Lab 04/11/14 0843 04/11/14 0909 04/11/14 1343 04/12/14 0221  NA 143 144 143 143  K 2.9* 2.7* 4.4 3.7  CL 107 111 109 112  CO2 17*  --  21 19  GLUCOSE 117* 121* 92 91  BUN 17 17 17 18   CREATININE 0.80 0.80 0.91 0.99  CALCIUM 8.9  --  8.5 8.4  MG  --   --  1.9  --    Liver Function Tests:  Recent Labs Lab 04/11/14 0843 04/12/14 0221  AST 27 20  ALT 18 13  ALKPHOS 194* 147*  BILITOT 0.6 0.9  PROT 7.3 5.6*  ALBUMIN 3.6 2.6*    Recent Labs Lab 04/11/14 0843  LIPASE 13   CBC:  Recent Labs Lab 04/11/14 0843 04/11/14 0909 04/12/14 0221  WBC 11.2*  --  8.6  NEUTROABS 9.8*  --   --   HGB 14.1 15.0 12.5*  HCT 42.5 44.0 38.8*  MCV 89.9  --  91.3  PLT 160  --  132*    Recent Results (from the past 240 hour(s))  MRSA PCR Screening     Status: None   Collection Time: 04/11/14  7:09 PM  Result Value Ref Range Status   MRSA by PCR NEGATIVE NEGATIVE Final    Comment:        The GeneXpert MRSA Assay (FDA approved for NASAL specimens only), is one component of a comprehensive MRSA colonization surveillance program. It is not intended to diagnose MRSA infection nor to guide or monitor treatment for MRSA infections.      Studies:  Recent x-ray studies have been reviewed in detail by the Attending Physician  Time spent :  [place], ANP [facility] Office  [phone] Pager [phone]  On-Call/Text Page:      [email or web address]      password [email or web address]  If 7PM-7AM, please contact night-coverage [email or web address] Password [email or web address] 04/12/2014, 2:27 PM   LOS: 1 day     I have personally examined this patient and reviewed the entire database. I have reviewed the above note, made any necessary editorial changes, and agree with its content.  Cherene Altes, MD [facility]

## 2014-04-12 NOTE — Progress Notes (Signed)
Subjective: Patient is resting comfortably.  When I woke him up, he stated "I feel the best that I've ever felt" No complaints of abdominal pain Two BM yesterday  Objective: Vital signs in last 24 hours: Temp:  [96.7 F (35.9 C)-98.9 F (37.2 C)] 97.3 F (36.3 C) (11/16 0749) Pulse Rate:  [55-79] 70 (11/16 0749) Resp:  [9-22] 12 (11/16 0749) BP: (99-151)/(53-89) 99/58 mmHg (11/16 0749) SpO2:  [94 %-100 %] 100 % (11/16 0749) Weight:  [100 lb (45.36 kg)-112 lb 10.5 oz (51.1 kg)] 112 lb 10.5 oz (51.1 kg) (11/15 2010) Last BM Date: 04/11/14  Intake/Output from previous day: 11/15 0701 - 11/16 0700 In: 2751 [P.O.:100; I.V.:1090] Out: 451 [Urine:450; Stool:1] Intake/Output this shift:    General appearance: alert, cooperative and no distress GI: soft, non-tender; bowel sounds normal; no masses,  no organomegaly Bilateral inguinal hernias are soft, reduced, non-tender Lab Results:   Recent Labs  04/11/14 0843 04/11/14 0909 04/12/14 0221  WBC 11.2*  --  8.6  HGB 14.1 15.0 12.5*  HCT 42.5 44.0 38.8*  PLT 160  --  132*   BMET  Recent Labs  04/11/14 1343 04/12/14 0221  NA 143 143  K 4.4 3.7  CL 109 112  CO2 21 19  GLUCOSE 92 91  BUN 17 18  CREATININE 0.91 0.99  CALCIUM 8.5 8.4   PT/INR No results for input(s): LABPROT, INR in the last 72 hours. ABG No results for input(s): PHART, HCO3 in the last 72 hours.  Invalid input(s): PCO2, PO2  Studies/Results: Ct Angio Abd/pel W/ And/or W/o  04/11/2014   CLINICAL DATA:  80 year old male with severe upper abdominal pain concerning for mesenteric ischemia.  EXAM: CTA ABDOMEN AND PELVIS wITHOUT AND WITH CONTRAST  TECHNIQUE: Multidetector CT imaging of the abdomen and pelvis was performed using the standard protocol during bolus administration of intravenous contrast. Multiplanar reconstructed images and MIPs were obtained and reviewed to evaluate the vascular anatomy.  CONTRAST:  139mL OMNIPAQUE IOHEXOL 350 MG/ML SOLN   COMPARISON:  Most recent prior CT abdomen/ pelvis 05/19/2013  FINDINGS: VASCULAR  Aorta: Enlarging fusiform infrarenal abdominal aortic aneurysm. The maximal transverse dimensions are 5.5 x 5.5 cm compared to 5.0 x 4.8 cm on 05/19/2013. The greater than 5 mm of enlargement over less than 1 year is considered relatively rapid interval growth. Additionally, there is been interval change in the partially thrombosed lumen. There is increased enhancement within of previously thrombosed lumen resulting in any thin linear internal flap / synechia. No signs of acute or impending rupture.  Celiac: Widely patent at the origin. Mild fusiform aneurysmal dilatation of the proximal segments to 11 mm. Extensive atherosclerotic vascular calcifications throughout the the splanchnic arteries. No focal splenic or hepatic arterial aneurysm. Conventional hepatic arterial anatomy.  SMA: Focal atherosclerotic plaque at the origin without significant stenosis. The vessel is widely patent distally.  Renals: Single renal arteries bilaterally. The left renal artery is lower than the right. Mild atherosclerotic plaque at the renal artery origins without evidence of significant stenosis.  IMA: Occluded at the origin. The vessel reconstitutes via retrograde flow distally.  Inflow: Ectatic common iliac arteries bilaterally with scattered atherosclerotic plaque. No focal stenosis. Both internal iliac arteries remain patent. No aneurysmal dilatation. The external iliac arteries are mildly tortuous but relatively spared from disease.  Proximal Outflow: Mild atherosclerotic calcification without stenosis in both common femoral arteries. Visualized proximal superficial and profunda femoral arteries are patent.  Veins: No focal venous abnormality.  NON-VASCULAR  Lower  Chest: Mild dependent atelectasis in the lower lobes. Calcification of the mitral valve annulus. The visualized cardiac structures are within normal limits for size. No pericardial  effusion. Small hiatal hernia.  Abdomen: Unremarkable appearance of the stomach. Periampullary duodenum diverticulum without evidence of inflammation. Unremarkable appearance of the spleen, adrenal glands and pancreas save for diffuse fatty atrophy. Normal hepatic contours and morphology. No discrete hepatic lesion. Mild diffuse intrahepatic and extrahepatic biliary ductal dilatation has improved compared to 05/19/2013. The main common bile duct measures 11 mm compared to 13 mm previously.  Unremarkable appearance of the bilateral kidneys. No focal solid lesion, hydronephrosis or nephrolithiasis. Stable nearly 6 cm cyst exophytic from the interpolar left kidney. Additional smaller hypo attenuating lesions are too small for accurate characterization but also statistically likely benign cysts. 3 mm nonobstructing stone in the lower pole of the right kidney. No hydronephrosis.  Massive left indirect inguinal hernia containing multiple loops of small bowel. There is been interval change in configuration of the small bowel with a single dilated loop of small bowel with diffuse wall thickening, and adjacent fluid and inflammatory change in the mesentery. There is a transition point as the bowel enters and leads the hernia sac concerning for closed loop obstruction. Relatively large volume of reactive ascites is present in the inferior aspect of the aneurysm sac.  Extensive sigmoid colonic diverticulosis without evidence of active diverticulitis. Surgical changes suggest prior right hemicolectomy with patent entero colonic anastomosis. Additional loops of the dilated small bowel are presence in the left upper and mid abdomen consistent with obstruction related to the left inguinal hernia.  Pelvis: Small right hydrocele. The massive left inguinal hernia without evidence of closed loop obstruction as described above. Prostatomegaly. Prostate gland measures 5.2 cm in transverse diameter. No suspicious adenopathy.  Bones/Soft  Tissues: No acute fracture or aggressive appearing lytic or blastic osseous lesion. Advanced multilevel degenerative disc disease and lower lumbar facet arthropathy.  Review of the MIP images confirms the above findings.  IMPRESSION: 1. Massive left indirect inguinal hernia complicated by probable closed loop obstruction of a loop of distal small bowel. There is marked focal wall thickening of the affected bowel loops and extensive inflammatory stranding and reactive fluid within the inguinal hernia sac. No evidence of ischemic necrosis or perforation at this time. Recommend urgent surgical consultation. 2. Associated changes of small bowel obstruction in the left upper and mid abdomen. 3. Enlarging fusiform infrarenal abdominal aneurysm currently measuring up to 5.5 cm compared to a maximum of 5.0 cm on 05/19/2013. No evidence of active or impending rupture. Greater than 5 mm growth over the past 12 months is associated with an increased risk of aneurysm rupture. Recommend vascular surgery referral/consultation if not already obtained. 4. Scattered atherosclerotic vascular disease without focal stenosis. Specifically, no evidence to suggest mesenteric ischemia. 5. Extensive sigmoid colonic diverticulosis without evidence of active diverticulitis. 6. Surgical changes of prior right partial colectomy with patent entero colonic anastomosis. 7. Additional ancillary findings as above without significant interval change. These results were called by telephone at the time of interpretation on 04/11/2014 at 11:57 am to Dr. Pattricia Boss , who verbally acknowledged these results.  Signed,  Criselda Peaches, MD  [facility]  [facility]   Electronically Signed   By: Jacqulynn Cadet M.D.   On: 04/11/2014 11:58    Anti-infectives: Anti-infectives    None      Assessment/Plan: s/p * No surgery found * Bilateral inguinal hernias with chronically  incarcerated small  bowel  No clinical signs of obstruction Would start clears, advance as tolerated The patient is declining any surgical intervention, so no need for follow-up unless he changes his mind. We will follow with you while he is in the hospital, but it looks like he won't be here very long. Would work on placement back at his assisted living facility  LOS: 1 day    Jeidi Gilles K. 04/12/2014

## 2014-04-12 NOTE — Progress Notes (Signed)
Daughter called.  Pt's history obtained.  Will keep pt full code at this time per daughters request even though pt has DNR yellow copy from Assisted living.  Will continue to monitor. Corey Walker

## 2014-04-13 ENCOUNTER — Other Ambulatory Visit: Payer: Self-pay | Admitting: *Deleted

## 2014-04-13 ENCOUNTER — Telehealth: Payer: Self-pay | Admitting: Vascular Surgery

## 2014-04-13 DIAGNOSIS — I714 Abdominal aortic aneurysm, without rupture, unspecified: Secondary | ICD-10-CM

## 2014-04-13 DIAGNOSIS — Z01810 Encounter for preprocedural cardiovascular examination: Secondary | ICD-10-CM

## 2014-04-13 NOTE — Progress Notes (Signed)
Patient refused am labs, Educated on need for labs,

## 2014-04-13 NOTE — Telephone Encounter (Addendum)
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   ----- Message -----    From: Elam Dutch, MD    Sent: 04/13/2014  12:08 PM      To: Vvs Charge Pool  Pt needs cardiology eval and then schedule AAA after  He passes.  John has already seen films for [employer].  [name]    04/13/14: spoke with transportation to schedule, dpm

## 2014-04-13 NOTE — Clinical Social Work Psychosocial (Signed)
Clinical Social Work Department BRIEF PSYCHOSOCIAL ASSESSMENT 04/13/2014  Patient:  Corey Walker, Corey Walker     Account Number:  [id number]     Admit date:  04/11/2014  Clinical Social Worker:  Delrae Sawyers  Date/Time:  04/13/2014 11:42 AM  Referred by:  Physician  Date Referred:  04/13/2014 Referred for  ALF Placement   Other Referral:   none.   Interview type:  Patient Other interview type:   none.    PSYCHOSOCIAL DATA Living Status:  FACILITY Admitted from facility:  Other Level of care:  Assisted Living Primary support name:  Miachel Roux Primary support relationship to patient:  CHILD, ADULT Degree of support available:   Adequate support system.    CURRENT CONCERNS Current Concerns  Post-Acute Placement   Other Concerns:   none.    SOCIAL WORK ASSESSMENT / PLAN CSW received referral regarding pt admitted from facility. CSW met wtih pt at bedside to discuss pt's living arrangement. Pt confirmed with CSW pt is from [facility]. Pt states pt plans to return once stable for discharge. Pt informed CSW pt's wife passed nine years ago, and pt depends on pt's daughter Corey Walker) if pt is in need.    CSW to continue to follow and assist with discharge planning needs.   Assessment/plan status:  Psychosocial Support/Ongoing Assessment of Needs Other assessment/ plan:   none.   Information/referral to community resources:   Pt to return to [facility]F once medically stable for discharge.    PATIENT'S/FAMILY'S RESPONSE TO PLAN OF CARE: Pt understanding and agreeable to CSW plan of care. Pt expressed no further questions or concerns at this time.       Lubertha Sayres, [place] ([phone]) Licensed Clinical Social Worker Orthopedics [phone]) and Surgical [phone])

## 2014-04-13 NOTE — Progress Notes (Signed)
OT NOTE  MD- d/c charge orders for patient: OT spoke directly to facility and is only able to provide x3 visits per discipline for therapy ( 3 visits of OT and 3 visits of PT per week "6 total"). Director asked for an order to have patient assisted with ambulation to dining hall and for all bathing/ adls for balance. If order is written for assistance with above task then the facility can accept back to "[facility]" in [place], [place] and provide the level of care required for the patient.    Jeri Modena   OTR/L Pager: [phone] Office: [phone] .

## 2014-04-13 NOTE — Evaluation (Signed)
Physical Therapy Evaluation Patient Details Name: Corey Walker MRN: [id number] DOB: 1918-04-01 Today's Date: 04/13/2014   History of Present Illness  80 yo male admitted with abdominal pain. Pt discovered to have left indirect inguinal hernia and declines surgery at this time. Pt also with AAA and agreeable to elective repair in a few weeks per Dr Oneida Alar notes.    Clinical Impression  Patient presents with functional limitations due to deficits listed in PT problem list (see below). Pt with generalized weakness and balance deficits impacting safe mobility. Pt with multiple LOB when not using rollator for support. Required Min-Mod A to prevent fall. Impaired safety awareness. Pt would benefit from acute PT to improve gait, balance and mobility so pt can maximize independence and minimize fall risk. Recommend supervision during ambulation to dining hall and for mobility initially for safety.    Follow Up Recommendations Home health PT;Supervision/Assistance - 24 hour    Equipment Recommendations  None recommended by PT    Recommendations for Other Services       Precautions / Restrictions Precautions Precautions: Fall Restrictions Weight Bearing Restrictions: No      Mobility  Bed Mobility Overal bed mobility: Modified Independent;Needs Assistance Bed Mobility: Supine to Sit;Sit to Supine     Supine to sit: Modified independent (Device/Increase time);HOB elevated Sit to supine: Modified independent (Device/Increase time);HOB elevated   General bed mobility comments: Increased time and effort- pulling up on sheets to elevate trunk. Able to return to supine more easily.  Transfers Overall transfer level: Needs assistance Equipment used: 4-wheeled walker Transfers: Sit to/from Stand Sit to Stand: Min guard         General transfer comment: "let me do it!" referring to standing. Min guard for steadying/balance.  Ambulation/Gait Ambulation/Gait assistance: Min  assist Ambulation Distance (Feet): 150 Feet Assistive device: 4-wheeled walker Gait Pattern/deviations: Step-through pattern;Narrow base of support;Trunk flexed Gait velocity: increased   General Gait Details: Pt impulsive during gait training with increased speed and taking hands purposely off rollator during ambulation. 1 LOB posteriorly when distracted and during standing rest break requiring min A to prevent fall posteriorly..Ambulating within room without rollator and 2 LOB requiring Mod A to prevent fall while blowing nose.  Stairs            Wheelchair Mobility    Modified Rankin (Stroke Patients Only)       Balance Overall balance assessment: Needs assistance Sitting-balance support: Feet supported;No upper extremity supported Sitting balance-Leahy Scale: Fair     Standing balance support: During functional activity Standing balance-Leahy Scale: Fair Standing balance comment: 2 LOB during dynamic standing activities - blowing nose and reaching for tissue without UE support. Balance improved with BUE support using rollator.                             Pertinent Vitals/Pain Pain Assessment: No/denies pain    Home Living Family/patient expects to be discharged to:: Assisted living               Home Equipment: Gilford Rile - 4 wheels Additional Comments: [place] , goes to [employer], plays piano and sings, sometimes takes food back to room to eat    Prior Function Level of Independence: Independent with assistive device(s)         Comments: Per OT-called facility and director reports they can have staff member (A) with ambulation to dining hall x3 per day and bathing task. Pt will  be allowed x3 days per week max per OT  and PT (6 visits total) per director     Hand Dominance   Dominant Hand: Right    Extremity/Trunk Assessment   Upper Extremity Assessment: Defer to OT evaluation           Lower Extremity Assessment: Generalized  weakness      Cervical / Trunk Assessment: Kyphotic  Communication   Communication: HOH  Cognition Arousal/Alertness: Awake/alert Behavior During Therapy: WFL for tasks assessed/performed Overall Cognitive Status: Impaired/Different from baseline Area of Impairment: Safety/judgement         Safety/Judgement: Decreased awareness of safety;Decreased awareness of deficits (taking hands off rollator in hallway to test limits.)     General Comments: Pt leaving sink running and needed cues to turn water off.     General Comments General comments (skin integrity, edema, etc.): pt with very dry skin presently and small thin frame so high risk for skin break down if not mobilized     Exercises        Assessment/Plan    PT Assessment Patient needs continued PT services  PT Diagnosis Generalized weakness;Difficulty walking   PT Problem List Decreased strength;Decreased balance;Decreased safety awareness;Decreased mobility  PT Treatment Interventions Balance training;Gait training;Neuromuscular re-education;Functional mobility training;Therapeutic activities;Therapeutic exercise;Patient/family education   PT Goals (Current goals can be found in the Care Plan section) Acute Rehab PT Goals Patient Stated Goal: to return to [facility] to sing for residence at dining hall PT Goal Formulation: With patient Time For Goal Achievement: 04/27/14 Potential to Achieve Goals: Good    Frequency Min 3X/week   Barriers to discharge Decreased caregiver support      Co-evaluation               End of Session Equipment Utilized During Treatment: Gait belt Activity Tolerance: Patient tolerated treatment well Patient left: in bed;with call bell/phone within reach;with bed alarm set;with family/visitor present Nurse Communication: Mobility status         Time: 4580-9983 PT Time Calculation (min) (ACUTE ONLY): 20 min   Charges:   PT Evaluation $Initial PT Evaluation Tier I: 1  Procedure PT Treatments $Gait Training: 8-22 mins   PT G CodesCandy Sledge A 04/13/2014, 4:27 PM  Candy Sledge, PT, DPT [phone]

## 2014-04-13 NOTE — Evaluation (Signed)
Occupational Therapy Evaluation Patient Details Name: Corey Walker MRN: [id number] DOB: 1918-03-02 Today's Date: 04/13/2014    History of Present Illness 80 yo male admitted with abdominal pain. Pt discovered to have left indirect inguinal hernia and declines surgery at this time. Pt also with AAA and agreeable to elective repair in a few weeks per Dr Oneida Alar notes. PMH:   Past Medical History  Diagnosis Date  . Hypertension   . Polymyalgia rheumatica   . Diverticulosis   . Thyroid disease   . Diarrhea   . Hypokalemia   . Colon cancer 2001  . Skin cancer   . AAA (abdominal aortic aneurysm)     4.6 X 4.9 cm  . Osteoporosis   . BPH (benign prostatic hyperplasia)   . Tinea unguium   . Temporal arteritis   . Obstructive jaundice     2013  . Pancreatitis     2013, 04/2013  . Inguinal hernia, left    Past Surgical History  Procedure Laterality Date  . Colon surgery  2001    right colectomy for colon cancer  . Eus  07/13/2011    Dr. Carol Ada, inadequate conscious sedation. limited views of pancreas. No evidence of choledocholithiasis, CBD 2mm.  . Cholecystectomy    . Ercp N/A 05/21/2013    Procedure: ENDOSCOPIC RETROGRADE CHOLANGIOPANCREATOGRAPHY (ERCP) Sphincterotomy and stone extraction;  Surgeon: Rogene Houston, MD;  Location: [facility];  Service: Endoscopy;  Laterality: N/A;      Clinical Impression   PT admitted with abdominal pain and found to have hernia and AAA. Pt currently with functional limitiations due to the deficits listed below (see OT problem list). PTA living at ALF "[facility] " in [place]. Pt was mod I with [id number].  Pt will benefit from skilled OT to increase their independence and safety with adls and balance to allow discharge HHOT x3 per week at facility. Facility contacted and currently able to (A) at this level upon d/c back.      Follow Up Recommendations  Home health OT (@ ALF)    Equipment Recommendations  None recommended by OT     Recommendations for Other Services       Precautions / Restrictions Precautions Precautions: Fall Restrictions Weight Bearing Restrictions: No      Mobility Bed Mobility Overal bed mobility: Modified Independent             General bed mobility comments: incr time and effort but able to problem solve exit  Transfers Overall transfer level: Needs assistance Equipment used: 1 person hand held assist Transfers: Sit to/from Stand Sit to Stand: Min assist         General transfer comment: pt pushing with BIL UE off bed surface but reaching for therapist immediately upon standing.     Balance Overall balance assessment: Needs assistance         Standing balance support: Single extremity supported;During functional activity Standing balance-Leahy Scale: Fair                              ADL Overall ADL's : Needs assistance/impaired Eating/Feeding: Modified independent   Grooming: Wash/dry face;Oral care;Min guard;Standing Grooming Details (indicate cue type and reason): pt with L UE support on sink surface for balance                 Toilet Transfer: Minimal assistance;Ambulation Toilet Transfer Details (indicate cue type and reason): pt ambulating  hand held (A).          Functional mobility during ADLs: Minimal assistance General ADL Comments: pt reports using a cane when leaving facility with cousin or whom ever he can get to take him to the store to buy "ensure and dr pepper" Pt otherwise uses a rollator in the facility. pt reports "i can do it without it" when challenged. Pt required min (A) hand held (A) and definitely requires use of DME. Pt remains fall risk. OT speaking directly to facility and advised that initial d/c first 48 hours high fall risk and staff (A) could be greatly benefical.      Vision                     Perception     Praxis      Pertinent Vitals/Pain Pain Assessment: No/denies pain     Hand  Dominance Right   Extremity/Trunk Assessment Upper Extremity Assessment Upper Extremity Assessment: Generalized weakness   Lower Extremity Assessment Lower Extremity Assessment: Generalized weakness   Cervical / Trunk Assessment Cervical / Trunk Assessment: Kyphotic   Communication Communication Communication: HOH   Cognition Arousal/Alertness: Awake/alert Behavior During Therapy: WFL for tasks assessed/performed Overall Cognitive Status: Impaired/Different from baseline Area of Impairment: Safety/judgement         Safety/Judgement: Decreased awareness of safety (willing to ambulate without RW or rollator this session)     General Comments: Pt leaving sink running and needed cues to turn water off.    General Comments       Exercises       Shoulder Instructions      Home Living Family/patient expects to be discharged to:: Assisted living                             Home Equipment: Walker - 4 wheels   Additional Comments: [place] , goes to [employer], plays piano and sings, sometimes takes food back to room to eat      Prior Functioning/Environment Level of Independence: Independent with assistive device(s)        Comments: called facility and director reports they can have staff member (A) with ambulation to dining hall x3 per day and bathing task. Pt will be allowed x3 days per week max per OT  and PT (6 visits total) per director    OT Diagnosis: Generalized weakness   OT Problem List: Decreased strength;Decreased activity tolerance;Impaired balance (sitting and/or standing);Decreased safety awareness;Decreased cognition;Decreased knowledge of use of DME or AE;Decreased knowledge of precautions   OT Treatment/Interventions: Self-care/ADL training;Therapeutic exercise;DME and/or AE instruction;Therapeutic activities;Cognitive remediation/compensation;Patient/family education;Balance training    OT Goals(Current goals can be found in the care  plan section) Acute Rehab OT Goals Patient Stated Goal: to return to [facility] to sing for residence at dining hall OT Goal Formulation: With patient Time For Goal Achievement: 04/27/14 Potential to Achieve Goals: Good  OT Frequency: Min 2X/week   Barriers to D/C:            Co-evaluation              End of Session Nurse Communication: Mobility status;Precautions  Activity Tolerance: Patient tolerated treatment well Patient left: in bed;with call bell/phone within reach   Time: 1416-1445 OT Time Calculation (min): 29 min Charges:  OT General Charges $OT Visit: 1 Procedure OT Evaluation $Initial OT Evaluation Tier I: 1 Procedure OT Treatments $Self Care/Home Management :  23-37 mins G-Codes:    Peri Maris 05-06-14, 3:16 PM Pager: [phone]

## 2014-04-13 NOTE — Progress Notes (Signed)
Patient ID: Corey Walker, male   DOB: 1917-09-09, 80 y.o.   MRN: [id number]    Subjective: Pt feels well today.  Had a BM this morning.  Tolerating clears and no abdominal pain  Objective: Vital signs in last 24 hours: Temp:  [97.6 F (36.4 C)-98 F (36.7 C)] 98 F (36.7 C) (11/17 0653) Pulse Rate:  [51-57] 52 (11/17 0653) Resp:  [16-19] 16 (11/17 0653) BP: (93-104)/(52-62) 101/52 mmHg (11/17 0653) SpO2:  [97 %-100 %] 97 % (11/17 0653) Last BM Date: 04/13/14  Intake/Output from previous day: 11/16 0701 - 11/17 0700 In: 2125 [P.O.:240; I.V.:1885] Out: 701 [Urine:700; Stool:1] Intake/Output this shift: Total I/O In: -  Out: 100 [Urine:100]  PE: Abd: soft, hernias are soft and NT, +BS  Lab Results:   Recent Labs  04/11/14 0843 04/11/14 0909 04/12/14 0221  WBC 11.2*  --  8.6  HGB 14.1 15.0 12.5*  HCT 42.5 44.0 38.8*  PLT 160  --  132*   BMET  Recent Labs  04/11/14 1343 04/12/14 0221  NA 143 143  K 4.4 3.7  CL 109 112  CO2 21 19  GLUCOSE 92 91  BUN 17 18  CREATININE 0.91 0.99  CALCIUM 8.5 8.4   PT/INR No results for input(s): LABPROT, INR in the last 72 hours. CMP     Component Value Date/Time   NA 143 04/12/2014 0221   NA 143 09/23/2013 1419   K 3.7 04/12/2014 0221   CL 112 04/12/2014 0221   CO2 19 04/12/2014 0221   GLUCOSE 91 04/12/2014 0221   GLUCOSE 120* 09/23/2013 1419   BUN 18 04/12/2014 0221   BUN 16 09/23/2013 1419   CREATININE 0.99 04/12/2014 0221   CALCIUM 8.4 04/12/2014 0221   PROT 5.6* 04/12/2014 0221   PROT 6.6 09/23/2013 1419   ALBUMIN 2.6* 04/12/2014 0221   AST 20 04/12/2014 0221   ALT 13 04/12/2014 0221   ALKPHOS 147* 04/12/2014 0221   BILITOT 0.9 04/12/2014 0221   GFRNONAA 68* 04/12/2014 0221   GFRAA 79* 04/12/2014 0221   Lipase     Component Value Date/Time   LIPASE 13 04/11/2014 0843       Studies/Results: No results found.  Anti-infectives: Anti-infectives    None       Assessment/Plan  1. Bilateral  inguinal hernias  Plan: 1. Patient refusing surgery, but psbo seems improved as he is tolerating clear liquids and moving his bowels.  Ok to advance his diet.  We will sign off as he does not want any surgery.   LOS: 2 days    [name] E 04/13/2014, 11:28 AM Pager: [phone]

## 2014-04-13 NOTE — Progress Notes (Signed)
[name]  Corey Walker EUM:[id number] DOB: [birth year] DOA: 04/11/2014 PCP: Redge Gainer, MD   Brief narrative: 80 year old male patient with known history of hypertension, hypothyroidism, colon cancer, and abdominal aortic aneurysm who presented to the emergency department with abdominal pain associated with nausea and diarrhea.  In the ER CT of the abdomen demonstrated a massive left indirect inguinal hernia with marked focal wall thickening. This was associated with a serum lactate of greater than 4. There was also an incidental finding of enlargement of the known fusiform infrarenal AAA with size increased from 5 cm to 5.5 cm. While in the emergency department Vascular Surgery was consulted. The EDP discussed with the family and the family discussed amongst themselves and reported that the patient wished elective repair of the abdominal aortic aneurysm. Because of the abnormal findings on CT regarding the hernia Gen Surgery was consulted, but the pt himself made it clear to the surgeon he did not desire to have surgery. Patient was admitted for bowel rest and symptomatic management.  Since admission patient's lactate has normalized, he's been rehydrated, nausea and vomiting has resolved. The hernia has self reduced. Gen. Surgery is recommending conservative management at this point.   HPI/Subjective: Patient is awake, alert, no acute distress. Gen. Surgery advancing his diet today. He appears to be functioning near his baseline.  Assessment/Plan:    AAA (abdominal aortic aneurysm) Subtle increase in size over the past 12 months. CT scan performed on 04/11/2014 showing enlarging fusiform infrarenal abdominal aortic aneurysm, maximal dimensions 5.5 x 5.5 cm compared to 5.0 x 4.8 cm on 05/19/2013. I discussed case with Dr. Oneida Alar of vascular surgery who not recommend endovascular repair to be performed during this hospitalization, they would like for  patient to follow-up with cardiology for preoperative clearance in the outpatient setting, and will see patient in the office to plan for procedure.    Lactic acidosis Resolved-continue IV fluid until proves appropriate oral intake    Inguinal hernia with obstruction Hernia has self reduced and patient currently symptom free, general surgery advancing his diet today.  Will continue conservative management. This was discussed with his daughter over telephone conversation who agreed with plan.    HTN  Blood pressures are stable    Hypokalemia resolved    Hypothyroidism Cont Synthroid    CKD (chronic kidney disease), stage III Baseline renal function 16/1.09   history temporal arteritis On chronic prednisone  DVT prophylaxis: SCDs Code Status: full  Family Communication: I spoke to his daughter over telephone conversation Disposition Plan/Expected LOS: anticipate discharge to his assisted living facility in the next 24 hours if remains stable  Consultants: VVS Gen. Surgery  Procedures: none  Cultures: none  Antibiotics: none  Objective: Blood pressure 127/57, pulse 56, temperature 98.5 F (36.9 C), temperature source Oral, resp. rate 18, height 5\' 7"  (1.702 m), weight 51.1 kg (112 lb 10.5 oz), SpO2 100 %.  Intake/Output Summary (Last 24 hours) at 04/13/14 1541 Last data filed at 04/13/14 5400  Gross per 24 hour  Intake   1485 ml  Output    501 ml  Net    984 ml   Exam: Gen: No acute respiratory distress-very hard of hearing-cachectic; friable skin Chest: Clear to auscultation bilaterally without wheezes, rhonchi or crackles, room air Cardiac: Regular rate and rhythm, no rubs murmurs or gallops, no peripheral edema, no JVD Abdomen: Soft nontender nondistended without obvious hepatosplenomegaly, no ascites Extremities: Symmetrical in appearance  without cyanosis, clubbing or effusion There is a large left-sided inguinal hernia present, nontender,  reducible  Scheduled Meds:  Scheduled Meds: . cholecalciferol  400 Units Oral BID  . cyanocobalamin  500 mcg Oral Daily  . donepezil  5 mg Oral QHS  . fluticasone  2 puff Inhalation BID  . levothyroxine  50 mcg Oral QAC breakfast  . lisinopril  10 mg Oral Daily  . predniSONE  5 mg Oral BID  . sodium chloride  3 mL Intravenous Q12H   Data Reviewed: Basic Metabolic Panel:  Recent Labs Lab 04/11/14 0843 04/11/14 0909 04/11/14 1343 04/12/14 0221  NA 143 144 143 143  K 2.9* 2.7* 4.4 3.7  CL 107 111 109 112  CO2 17*  --  21 19  GLUCOSE 117* 121* 92 91  BUN 17 17 17 18   CREATININE 0.80 0.80 0.91 0.99  CALCIUM 8.9  --  8.5 8.4  MG  --   --  1.9  --    Liver Function Tests:  Recent Labs Lab 04/11/14 0843 04/12/14 0221  AST 27 20  ALT 18 13  ALKPHOS 194* 147*  BILITOT 0.6 0.9  PROT 7.3 5.6*  ALBUMIN 3.6 2.6*    Recent Labs Lab 04/11/14 0843  LIPASE 13   CBC:  Recent Labs Lab 04/11/14 0843 04/11/14 0909 04/12/14 0221  WBC 11.2*  --  8.6  NEUTROABS 9.8*  --   --   HGB 14.1 15.0 12.5*  HCT 42.5 44.0 38.8*  MCV 89.9  --  91.3  PLT 160  --  132*    Recent Results (from the past 240 hour(s))  MRSA PCR Screening     Status: None   Collection Time: 04/11/14  7:09 PM  Result Value Ref Range Status   MRSA by PCR NEGATIVE NEGATIVE Final    Comment:        The GeneXpert MRSA Assay (FDA approved for NASAL specimens only), is one component of a comprehensive MRSA colonization surveillance program. It is not intended to diagnose MRSA infection nor to guide or monitor treatment for MRSA infections.      Studies:  Recent x-ray studies have been reviewed in detail by the Attending Physician  Time spent :  30 mins   If 7PM-7AM, please contact night-coverage [email or web address] Password [email or web address] 04/13/2014, 3:41 PM   LOS: 2 days

## 2014-04-13 NOTE — Progress Notes (Signed)
PT Cancellation Note  Patient Details Name: Corey Walker MRN: [id number] DOB: 06-Aug-1917   Cancelled Treatment:    Reason Eval/Treat Not Completed: Patient declined.  PT began evaluation and asking pt questions, but pt declined physical assessment due to lunch "coming soon".  Pt in bed with napkins in lap and table set-up and not wanting to disturb his set-up.Marland Kitchen  Returned an hour later and pt eating lunch.  Will check back as able to complete evaluation.   [name] LUBECK 04/13/2014, 1:19 PM

## 2014-04-14 ENCOUNTER — Telehealth: Payer: Self-pay | Admitting: Cardiology

## 2014-04-14 ENCOUNTER — Other Ambulatory Visit: Payer: Self-pay

## 2014-04-14 DIAGNOSIS — N183 Chronic kidney disease, stage 3 (moderate): Secondary | ICD-10-CM

## 2014-04-14 LAB — BASIC METABOLIC PANEL
ANION GAP: 9 (ref 5–15)
BUN: 15 mg/dL (ref 6–23)
CALCIUM: 8.1 mg/dL — AB (ref 8.4–10.5)
CHLORIDE: 110 meq/L (ref 96–112)
CO2: 18 meq/L — AB (ref 19–32)
Creatinine, Ser: 1.02 mg/dL (ref 0.50–1.35)
GFR calc Af Amer: 70 mL/min — ABNORMAL LOW (ref >90)
GFR calc non Af Amer: 61 mL/min — ABNORMAL LOW (ref >90)
GLUCOSE: 146 mg/dL — AB (ref 70–99)
Potassium: 4.2 mEq/L (ref 3.7–5.3)
Sodium: 137 mEq/L (ref 137–147)

## 2014-04-14 LAB — CBC
HEMATOCRIT: 33.4 % — AB (ref 39.0–52.0)
HEMOGLOBIN: 11 g/dL — AB (ref 13.0–17.0)
MCH: 29.9 pg (ref 26.0–34.0)
MCHC: 32.9 g/dL (ref 30.0–36.0)
MCV: 90.8 fL (ref 78.0–100.0)
Platelets: 113 10*3/uL — ABNORMAL LOW (ref 150–400)
RBC: 3.68 MIL/uL — ABNORMAL LOW (ref 4.22–5.81)
RDW: 14.1 % (ref 11.5–15.5)
WBC: 6.6 10*3/uL (ref 4.0–10.5)

## 2014-04-14 NOTE — Discharge Summary (Signed)
Physician Discharge Summary  Patient ID: Corey Walker MRN: [id number] DOB/AGE: 1918/04/08 80 y.o.  Admit date: 04/11/2014 Discharge date: 04/14/2014  Primary Care Physician:  Redge Gainer, MD  Discharge Diagnoses:    . Lactic acidosis . Inguinal hernia with obstruction Nausea and diarrhea, abdominal pain-improved . Hypothyroidism . AAA (abdominal aortic aneurysm) . HTN (hypertension) . Hypokalemia . CKD (chronic kidney disease), stage III  Consults: Gen. Surgery, [place]  vascular surgery, Dr. Oneida Alar   Allergies:   Allergies  Allergen Reactions  . Darvocet [Propoxyphene N-Acetaminophen]     Propoxyphene (Per MAR)  . Fish Oil Other (See Comments)    unk  . Lactose Intolerance (Gi) Other (See Comments)    unk   . Loratadine [Loratadine] Other (See Comments)    dizziness (PER MAR)  . Penicillins Swelling    Swelling of hands (per [facility] records)     Discharge Medications:   Medication List    STOP taking these medications        anti-nausea solution     azithromycin 250 MG tablet  Commonly known as:  ZITHROMAX     trimethoprim-polymyxin b ophthalmic solution  Commonly known as:  POLYTRIM      TAKE these medications        cholecalciferol 400 UNITS Tabs tablet  Commonly known as:  VITAMIN D  Take 400 Units by mouth 2 (two) times daily.     cyanocobalamin 500 MCG tablet  Commonly known as:  V-R VITAMIN B-12  Take 1 tablet (500 mcg total) by mouth daily.     donepezil 5 MG tablet  Commonly known as:  ARICEPT  Take 1 tablet (5 mg total) by mouth at bedtime.     feeding supplement Liqd  Take 237 mLs by mouth 2 (two) times daily.     ferrous sulfate 325 (65 FE) MG tablet  Take 325 mg by mouth daily with breakfast.     furosemide 20 MG tablet  Commonly known as:  LASIX  Take 20 mg by mouth every other day.     levothyroxine 50 MCG tablet  Commonly known as:  SYNTHROID, LEVOTHROID  Take 50 mcg by mouth daily.     lisinopril 10 MG tablet   Commonly known as:  PRINIVIL,ZESTRIL  Take 10 mg by mouth daily.     predniSONE 5 MG tablet  Commonly known as:  DELTASONE  Take 5 mg by mouth 2 (two) times daily.     promethazine 12.5 MG tablet  Commonly known as:  PHENERGAN  Take 12.5 mg by mouth every 8 (eight) hours as needed for nausea.     PULMICORT FLEXHALER 90 MCG/ACT inhaler  Generic drug:  Budesonide  Inhale 1 puff into the lungs 2 (two) times daily.         Brief H and P: For complete details please refer to admission H and P, but in brief per Admission history on 11/15 Corey Walker is a 80 y.o. male With currently treated htn, hypothyroid, hx of colon cancer, AAA, who presents to the ED with abd pain with nausea with diarrhea. In the ED, pt underwent CT abd with findings of a massive L indirect inguinal hernia with marked focal wall thickening with lactate of over 4. Pt was also noted to have enlarging fusiform infrarenal AAA with size of 5.5cm up from 5cm on previous study. Vascular surgery was consulted. Family discussed options and pt wishes elective repair of AAA. General Surgery was also consulted. Pt declined surgery  at the bedside. Surgical recs for admission to hospitalist service, bowel rest, IVF.  Hospital Course:  Patient is a 80 year old male with known history of hypertension, hypothyroidism, colon cancer and abdominal aortic aneurysm who presented to ED with abdominal pain associated with nausea and diarrhea. In the ER, CT of the abdomen showed a massive left indirect inguinal hernia with marked focal wall thickening. There was also an incidental finding of enlargement of known fusiform infrarenal AAA with size increased from 5-5.5 cm. In the ED, vascular surgery was consulted. Patient wished elective repair of the abdominal aortic aneurysm. Because of the CT finding of the hernia Gen. Surgery was consulted but patient made it clear to the surgery that he did not desire to have surgery regarding the hernia.  Patient was admitted for bowel rest and symptomatic management.  Lactic acidosis Patient was noted to have lactic acid of greater than 4, he was placed on IV fluids, rehydrated, nausea vomiting has resolved, he is tolerating oral diet without any difficulty.  Abdominal aortic aneurysm unruptured, asymptomatic Subtle increase in size over the past 12 months, CT scan on 11/15 showed enlarging fusiform infrarenal abdominal aortic aneurysm, maximal dimensions 5.5 x 5.5 cmcompared to 5.0 x 4.8 cm on 05/19/13 Vascular surgery was consulted, patient was measured for a Gore excluder stent graft, will need cardiac risk assessment, he has appointment with cardiology tomorrow on 11/19 with Dr. Dr. Percival Spanish. Patient will have follow-up appointment with vascular surgery for elective repair.  Inguinal hernia Gen. Surgery was consulted however patient stated that he did not want to have the hernia surgery. He is currently tolerating oral diet without any difficulty. Avoid constipation.  Hypothyroidism Continue Synthroid  Chronic kidney disease, stage III; stable  Day of Discharge BP 122/58 mmHg  Pulse 49  Temp(Src) 98 F (36.7 C) (Oral)  Resp 16  Ht 5\' 7"  (1.702 m)  Wt 51.1 kg (112 lb 10.5 oz)  BMI 17.64 kg/m2  SpO2 97%  Physical Exam: General: Alert and awake oriented x3 not in any acute distress, cachectic. HEENT: anicteric sclera, pupils reactive to light and accommodation CVS: S1-S2 clear no murmur rubs or gallops Chest: clear to auscultation bilaterally, no wheezing rales or rhonchi Abdomen: soft nontender, nondistended, normal bowel sounds, large left-sided inguinal hernia, reducible Extremities: no cyanosis, clubbing or edema noted bilaterally Neuro: Cranial nerves II-XII intact, no focal neurological deficits   The results of significant diagnostics from this hospitalization (including imaging, microbiology, ancillary and laboratory) are listed below for reference.    LAB  RESULTS: Basic Metabolic Panel:  Recent Labs Lab 04/11/14 1343 04/12/14 0221 04/14/14 0525  NA 143 143 137  K 4.4 3.7 4.2  CL 109 112 110  CO2 21 19 18*  GLUCOSE 92 91 146*  BUN 17 18 15   CREATININE 0.91 0.99 1.02  CALCIUM 8.5 8.4 8.1*  MG 1.9  --   --    Liver Function Tests:  Recent Labs Lab 04/11/14 0843 04/12/14 0221  AST 27 20  ALT 18 13  ALKPHOS 194* 147*  BILITOT 0.6 0.9  PROT 7.3 5.6*  ALBUMIN 3.6 2.6*    Recent Labs Lab 04/11/14 0843  LIPASE 13   No results for input(s): AMMONIA in the last 168 hours. CBC:  Recent Labs Lab 04/11/14 0843  04/12/14 0221 04/14/14 0525  WBC 11.2*  --  8.6 6.6  NEUTROABS 9.8*  --   --   --   HGB 14.1  < > 12.5* 11.0*  HCT 42.5  < >  38.8* 33.4*  MCV 89.9  --  91.3 90.8  PLT 160  --  132* 113*  < > = values in this interval not displayed. Cardiac Enzymes: No results for input(s): CKTOTAL, CKMB, CKMBINDEX, TROPONINI in the last 168 hours. BNP: Invalid input(s): POCBNP CBG: No results for input(s): GLUCAP in the last 168 hours.  Significant Diagnostic Studies:  Ct Angio Abd/pel W/ And/or W/o  04/11/2014   CLINICAL DATA:  80 year old male with severe upper abdominal pain concerning for mesenteric ischemia.  EXAM: CTA ABDOMEN AND PELVIS wITHOUT AND WITH CONTRAST  TECHNIQUE: Multidetector CT imaging of the abdomen and pelvis was performed using the standard protocol during bolus administration of intravenous contrast. Multiplanar reconstructed images and MIPs were obtained and reviewed to evaluate the vascular anatomy.  CONTRAST:  127mL OMNIPAQUE IOHEXOL 350 MG/ML SOLN  COMPARISON:  Most recent prior CT abdomen/ pelvis 05/19/2013  FINDINGS: VASCULAR  Aorta: Enlarging fusiform infrarenal abdominal aortic aneurysm. The maximal transverse dimensions are 5.5 x 5.5 cm compared to 5.0 x 4.8 cm on 05/19/2013. The greater than 5 mm of enlargement over less than 1 year is considered relatively rapid interval growth. Additionally,  there is been interval change in the partially thrombosed lumen. There is increased enhancement within of previously thrombosed lumen resulting in any thin linear internal flap / synechia. No signs of acute or impending rupture.  Celiac: Widely patent at the origin. Mild fusiform aneurysmal dilatation of the proximal segments to 11 mm. Extensive atherosclerotic vascular calcifications throughout the the splanchnic arteries. No focal splenic or hepatic arterial aneurysm. Conventional hepatic arterial anatomy.  SMA: Focal atherosclerotic plaque at the origin without significant stenosis. The vessel is widely patent distally.  Renals: Single renal arteries bilaterally. The left renal artery is lower than the right. Mild atherosclerotic plaque at the renal artery origins without evidence of significant stenosis.  IMA: Occluded at the origin. The vessel reconstitutes via retrograde flow distally.  Inflow: Ectatic common iliac arteries bilaterally with scattered atherosclerotic plaque. No focal stenosis. Both internal iliac arteries remain patent. No aneurysmal dilatation. The external iliac arteries are mildly tortuous but relatively spared from disease.  Proximal Outflow: Mild atherosclerotic calcification without stenosis in both common femoral arteries. Visualized proximal superficial and profunda femoral arteries are patent.  Veins: No focal venous abnormality.  NON-VASCULAR  Lower Chest: Mild dependent atelectasis in the lower lobes. Calcification of the mitral valve annulus. The visualized cardiac structures are within normal limits for size. No pericardial effusion. Small hiatal hernia.  Abdomen: Unremarkable appearance of the stomach. Periampullary duodenum diverticulum without evidence of inflammation. Unremarkable appearance of the spleen, adrenal glands and pancreas save for diffuse fatty atrophy. Normal hepatic contours and morphology. No discrete hepatic lesion. Mild diffuse intrahepatic and extrahepatic  biliary ductal dilatation has improved compared to 05/19/2013. The main common bile duct measures 11 mm compared to 13 mm previously.  Unremarkable appearance of the bilateral kidneys. No focal solid lesion, hydronephrosis or nephrolithiasis. Stable nearly 6 cm cyst exophytic from the interpolar left kidney. Additional smaller hypo attenuating lesions are too small for accurate characterization but also statistically likely benign cysts. 3 mm nonobstructing stone in the lower pole of the right kidney. No hydronephrosis.  Massive left indirect inguinal hernia containing multiple loops of small bowel. There is been interval change in configuration of the small bowel with a single dilated loop of small bowel with diffuse wall thickening, and adjacent fluid and inflammatory change in the mesentery. There is a transition point as the bowel  enters and leads the hernia sac concerning for closed loop obstruction. Relatively large volume of reactive ascites is present in the inferior aspect of the aneurysm sac.  Extensive sigmoid colonic diverticulosis without evidence of active diverticulitis. Surgical changes suggest prior right hemicolectomy with patent entero colonic anastomosis. Additional loops of the dilated small bowel are presence in the left upper and mid abdomen consistent with obstruction related to the left inguinal hernia.  Pelvis: Small right hydrocele. The massive left inguinal hernia without evidence of closed loop obstruction as described above. Prostatomegaly. Prostate gland measures 5.2 cm in transverse diameter. No suspicious adenopathy.  Bones/Soft Tissues: No acute fracture or aggressive appearing lytic or blastic osseous lesion. Advanced multilevel degenerative disc disease and lower lumbar facet arthropathy.  Review of the MIP images confirms the above findings.  IMPRESSION: 1. Massive left indirect inguinal hernia complicated by probable closed loop obstruction of a loop of distal small bowel. There  is marked focal wall thickening of the affected bowel loops and extensive inflammatory stranding and reactive fluid within the inguinal hernia sac. No evidence of ischemic necrosis or perforation at this time. Recommend urgent surgical consultation. 2. Associated changes of small bowel obstruction in the left upper and mid abdomen. 3. Enlarging fusiform infrarenal abdominal aneurysm currently measuring up to 5.5 cm compared to a maximum of 5.0 cm on 05/19/2013. No evidence of active or impending rupture. Greater than 5 mm growth over the past 12 months is associated with an increased risk of aneurysm rupture. Recommend vascular surgery referral/consultation if not already obtained. 4. Scattered atherosclerotic vascular disease without focal stenosis. Specifically, no evidence to suggest mesenteric ischemia. 5. Extensive sigmoid colonic diverticulosis without evidence of active diverticulitis. 6. Surgical changes of prior right partial colectomy with patent entero colonic anastomosis. 7. Additional ancillary findings as above without significant interval change. These results were called by telephone at the time of interpretation on 04/11/2014 at 11:57 am to Dr. Pattricia Boss , who verbally acknowledged these results.  Signed,  Criselda Peaches, MD  [facility]  [facility]   Electronically Signed   By: Jacqulynn Cadet M.D.   On: 04/11/2014 11:58    2D ECHO:   Disposition and Follow-up: Discharge Instructions    Discharge instructions    Complete by:  As directed   DIET: SOFT BLAND DIET     Increase activity slowly    Complete by:  As directed             DISPOSITION: assisted-living facility  [place] Follow-up Information    Follow up with Redge Gainer, MD. Schedule an appointment as soon as possible for a visit in 2 weeks.   Specialty:  Family Medicine   Why:  for hospital follow-up   Contact information:    [address] [place] [address] [phone]       Follow up with Minus Breeding, MD On 04/15/2014.   Specialty:  Cardiology   Why:  2:30PM   Contact information:   [address] [place] [address] [phone]       Follow up with Elam Dutch, MD. Schedule an appointment as soon as possible for a visit in 2 weeks.   Specialty:  Vascular Surgery   Why:  for hospital follow-up   Contact information:   [address] [place] [address] [phone]       Time spent on Discharge: 40 mins  Signed:   Estill Cotta  M.D. [facility] 04/14/2014, 11:57 AM Pager: [phone]

## 2014-04-14 NOTE — Progress Notes (Signed)
Discharge home. Pick up by staff from [facility]. Packets given to staff. Patient is alert and oriented upon discharge, not in any distress.

## 2014-04-14 NOTE — Telephone Encounter (Signed)
Noted. Patient has OV 11/19

## 2014-04-14 NOTE — Clinical Social Work Note (Signed)
Pt to be discharged back to [facility]. Pt updated at bedside.  [facility]: [phone] Transportation: Provided by [facility], will pick patient up at bedside after lunch (no specific time given).  Lubertha Sayres, Scraper ([phone]) Licensed Clinical Social Worker Orthopedics [phone]) and Surgical [phone])

## 2014-04-14 NOTE — Progress Notes (Signed)
Physical Therapy Treatment Patient Details Name: Corey Walker MRN: [id number] DOB: 1917/06/14 Today's Date: 04/14/2014    History of Present Illness 79 yo male admitted with abdominal pain. Pt discovered to have left indirect inguinal hernia and declines surgery at this time. Pt also with AAA and agreeable to elective repair in a few weeks per Dr Oneida Alar notes.    PT Comments    Pt preferred to stay in room due to waiting for transport to come get him to go back to ALF.  Worked on standing balance in room including 360 degree turns, picking up items off of floor, tandem stance, and standing therex.  He dis ambulate in room with 1 HHA and was holding onto furniture with at least one hand with all standing activities including the turning 360.  Recommend HHPT.  Pt very pleasant, but had difficulty following instructions at times due to [facility].  Follow Up Recommendations  Home health PT;Supervision/Assistance - 24 hour     Equipment Recommendations  None recommended by PT    Recommendations for Other Services       Precautions / Restrictions Precautions Precautions: Fall    Mobility  Bed Mobility                  Transfers   Equipment used: None Transfers: Sit to/from Stand Sit to Stand: Min guard            Ambulation/Gait Ambulation/Gait assistance: Min assist Ambulation Distance (Feet): 20 Feet Assistive device: 1 person hand held assist Gait Pattern/deviations: Step-through pattern;Trunk flexed     General Gait Details: Pt ambulated in room with HHA.  Pt did not want to leave room due to waiting for transport to take him back to ALF.   Stairs            Wheelchair Mobility    Modified Rankin (Stroke Patients Only)       Balance Overall balance assessment: Needs assistance         Standing balance support: During functional activity Standing balance-Leahy Scale: Fair                 High Level Balance Comments: Worked on reaching  outside of BOS, picked up item off of the floor, tandem stance    Cognition Arousal/Alertness: Awake/alert Behavior During Therapy: WFL for tasks assessed/performed Overall Cognitive Status: Within Functional Limits for tasks assessed Area of Impairment: Safety/judgement               General Comments: Pt very HOH and has trouble with directions.    Exercises General Exercises - Lower Extremity Hip Flexion/Marching: Standing;10 reps Heel Raises: Strengthening;10 reps Mini-Sqauts: Strengthening;10 reps    General Comments General comments (skin integrity, edema, etc.): no LOB with PT today.      Pertinent Vitals/Pain Pain Assessment: No/denies pain    Home Living                      Prior Function            PT Goals (current goals can now be found in the care plan section) Acute Rehab PT Goals Patient Stated Goal: to return to [facility] to sing for residence at dining hall PT Goal Formulation: With patient Time For Goal Achievement: 04/27/14 Potential to Achieve Goals: Good Progress towards PT goals: Progressing toward goals    Frequency  Min 3X/week    PT Plan Current plan remains appropriate    Co-evaluation  End of Session Equipment Utilized During Treatment: Gait belt Activity Tolerance: Patient tolerated treatment well Patient left: in chair;with call bell/phone within reach;with chair alarm set     Time: 7026-3785 PT Time Calculation (min) (ACUTE ONLY): 15 min  Charges:  $Therapeutic Exercise: 8-22 mins                    G Codes:      [name] 04/14/2014, 1:06 PM

## 2014-04-14 NOTE — Care Management (Signed)
Orders for HHPT / OT . Called Kathy at [facility] , was instructed to set [facility] up with [place]. Same done .  [facility]   [address] [place] , [place]   Phone [place] [phone]

## 2014-04-14 NOTE — Telephone Encounter (Signed)
Spoke with Sharyn Lull @ [facility] - Dr. Oneida Alar is going to do an AAA repair (not yet scheduled). Sharyn Lull wanted to know if patient can go ahead and be cleared for surgery - he is scheduled for an appmt tomorrow 11/19 with Dr. Percival Spanish for surgical clearance. She states patient is still in hospital per their nurse, but discharge summary was done today around 1155am. Sharyn Lull reports patient lives in [place] and she thinks he may not leave hospital until some time tomorrow?  Dr. Oneida Alar ([facility]) office fax is [phone]  Routed to [facility] & Dr. Percival Spanish to review & advise

## 2014-04-14 NOTE — Telephone Encounter (Signed)
Looks like he was discharged and I cannot clear him without seeing him.

## 2014-04-15 ENCOUNTER — Ambulatory Visit: Payer: Medicare Other | Admitting: Cardiology

## 2014-04-16 NOTE — Telephone Encounter (Signed)
Pt was no show on 11/19

## 2014-04-21 ENCOUNTER — Ambulatory Visit (INDEPENDENT_AMBULATORY_CARE_PROVIDER_SITE_OTHER): Payer: Medicare Other | Admitting: Physician Assistant

## 2014-04-21 ENCOUNTER — Encounter: Payer: Self-pay | Admitting: Physician Assistant

## 2014-04-21 VITALS — BP 146/74 | HR 60 | Ht 67.0 in | Wt 115.2 lb

## 2014-04-21 DIAGNOSIS — R011 Cardiac murmur, unspecified: Secondary | ICD-10-CM

## 2014-04-21 DIAGNOSIS — Z01818 Encounter for other preprocedural examination: Secondary | ICD-10-CM

## 2014-04-21 NOTE — Progress Notes (Signed)
Patient ID: EDWIN BAINES, male   DOB: 1918-01-23, 80 y.o.   MRN: [id number]    Date:  04/21/2014   ID:  RAYMAN PETROSIAN, DOB 1917-06-12, MRN [id number]  PCP:  Redge Gainer, MD  Primary Cardiologist:  Percival Spanish     History of Present Illness: NGAI PARCELL is a 80 y.o. male with no prior cardiac history other than palpitations but with a history of HTN, hypothyroidism, chronic kidney disease, colon cancer, pancreatitis, obstructive jaundice.  He was recently hospitalized from November 15 November 18 with lactic acidosis, nausea and diarrhea, inguinal hernia. His fusiform AAA has increased in size to 5.5 x 5.5cm.  The patient wishes to have this repaired and presents today for preop clearance.  According to Dr. Nona Dell note they're planning a Gore Excluder stent graft.  The patient is very pleasant and was never in the hospital until he was 80 yo. He is not very active and walks with a cane. He has off and on LEE and Abd pain.   He denies nausea, vomiting, fever, chest pain, shortness of breath, orthopnea, dizziness, PND, cough, congestion, hematochezia, melena.  Wt Readings from Last 3 Encounters:  04/21/14 115 lb 3.2 oz (52.254 kg)  04/11/14 112 lb 10.5 oz (51.1 kg)  12/02/13 116 lb 6.4 oz (52.799 kg)     Past Medical History  Diagnosis Date  . Hypertension   . Polymyalgia rheumatica   . Diverticulosis   . Thyroid disease   . Diarrhea   . Hypokalemia   . Colon cancer 2001  . Skin cancer   . AAA (abdominal aortic aneurysm)     4.6 X 4.9 cm  . Osteoporosis   . BPH (benign prostatic hyperplasia)   . Tinea unguium   . Temporal arteritis   . Obstructive jaundice     2013  . Pancreatitis     2013, 04/2013  . Inguinal hernia, left     Current Outpatient Prescriptions  Medication Sig Dispense Refill  . Budesonide (PULMICORT FLEXHALER) 90 MCG/ACT inhaler Inhale 1 puff into the lungs 2 (two) times daily.    . cholecalciferol (VITAMIN D) 400 UNITS TABS Take 400 Units by mouth 2  (two) times daily.    . cyanocobalamin (V-R VITAMIN B-12) 500 MCG tablet Take 1 tablet (500 mcg total) by mouth daily. 30 tablet 11  . donepezil (ARICEPT) 5 MG tablet Take 1 tablet (5 mg total) by mouth at bedtime. 30 tablet 11  . feeding supplement (ENSURE IMMUNE HEALTH) LIQD Take 237 mLs by mouth 2 (two) times daily.    . ferrous sulfate 325 (65 FE) MG tablet Take 325 mg by mouth daily with breakfast.    . furosemide (LASIX) 20 MG tablet Take 20 mg by mouth every other day.     . levothyroxine (SYNTHROID, LEVOTHROID) 50 MCG tablet Take 50 mcg by mouth daily.    Marland Kitchen lisinopril (PRINIVIL,ZESTRIL) 10 MG tablet Take 10 mg by mouth daily.    . predniSONE (DELTASONE) 5 MG tablet Take 5 mg by mouth 2 (two) times daily.    . promethazine (PHENERGAN) 12.5 MG tablet Take 12.5 mg by mouth every 8 (eight) hours as needed for nausea.     No current facility-administered medications for this visit.    Allergies:    Allergies  Allergen Reactions  . Darvocet [Propoxyphene N-Acetaminophen]     Propoxyphene (Per MAR)  . Fish Oil Other (See Comments)    unk  . Lactose Intolerance (Gi) Other (See Comments)  unk   . Loratadine [Loratadine] Other (See Comments)    dizziness (PER MAR)  . Penicillins Swelling    Swelling of hands (per [facility] records)    Social History:  The patient  reports that he has never smoked. He does not have any smokeless tobacco history on file. He reports that he does not drink alcohol or use illicit drugs.   Family history:  No family history on file.  ROS:  Please see the history of present illness.  All other systems reviewed and negative.   PHYSICAL EXAM: VS:  BP 146/74 mmHg  Pulse 60  Ht 5\' 7"  (1.702 m)  Wt 115 lb 3.2 oz (52.254 kg)  BMI 18.04 kg/m2 Thin, well developed, in no acute distress HEENT: Pupils are equal round react to light accommodation extraocular movements are intact.  Neck: no JVDNo cervical lymphadenopathy. Cardiac: Regular rate and rhythm with  2/6 sys murmur LSB. Lungs:  clear to auscultation bilaterally, no wheezing, rhonchi or rales Abd: soft, nontender, positive bowel sounds all quadrants, no hepatosplenomegaly.  Pulsatile mass Ext: Trace lower extremity edema.  2+ radial and dorsalis pedis pulses. Skin: warm and dry Neuro:  Grossly normal  EKG:  Sinus brady 58 BPM with frequent PACs  ASSESSMENT AND PLAN:  Problem List Items Addressed This Visit    Preoperative clearance - Primary    Patient be scheduled for Lexiscan Myoview and 2-D echocardiogram.  After review of study results, clearence will be sent to Dr. Oneida Alar office.    Relevant Orders      Myocardial Perfusion Imaging      2D Echocardiogram without contrast    Other Visit Diagnoses    Murmur        Relevant Orders       2D Echocardiogram without contrast

## 2014-04-21 NOTE — Assessment & Plan Note (Signed)
Patient be scheduled for [facility] and 2-D echocardiogram.  After review of study results, clearence will be sent to Dr. Oneida Alar office.

## 2014-04-21 NOTE — Patient Instructions (Signed)
Your physician has requested that you have an echocardiogram. Echocardiography is a painless test that uses sound waves to create images of your heart. It provides your doctor with information about the size and shape of your heart and how well your heart's chambers and valves are working. This procedure takes approximately one hour. There are no restrictions for this procedure.  Your physician has requested that you have a lexiscan myoview. For further information please visit [email or web address]. Please follow instruction sheet, as given.  You will be call with result

## 2014-04-28 ENCOUNTER — Encounter: Payer: Self-pay | Admitting: Cardiology

## 2014-04-29 ENCOUNTER — Encounter: Payer: Self-pay | Admitting: Cardiology

## 2014-05-05 ENCOUNTER — Telehealth (HOSPITAL_COMMUNITY): Payer: Self-pay

## 2014-05-05 ENCOUNTER — Encounter (HOSPITAL_COMMUNITY): Payer: Medicare Other

## 2014-05-05 ENCOUNTER — Ambulatory Visit (HOSPITAL_COMMUNITY): Payer: Medicare Other

## 2014-05-05 NOTE — Telephone Encounter (Signed)
Encounter complete. 

## 2014-05-07 ENCOUNTER — Ambulatory Visit (HOSPITAL_BASED_OUTPATIENT_CLINIC_OR_DEPARTMENT_OTHER)
Admission: RE | Admit: 2014-05-07 | Discharge: 2014-05-07 | Disposition: A | Discharge status: Home / Self Care | Payer: Medicare Other | Source: Ambulatory Visit | Attending: Physician Assistant | Admitting: Physician Assistant

## 2014-05-07 ENCOUNTER — Ambulatory Visit (HOSPITAL_COMMUNITY)
Admission: RE | Admit: 2014-05-07 | Discharge: 2014-05-07 | Disposition: A | Discharge status: Home / Self Care | Payer: Medicare Other | Source: Ambulatory Visit | Attending: Cardiology | Admitting: Cardiology

## 2014-05-07 DIAGNOSIS — R011 Cardiac murmur, unspecified: Secondary | ICD-10-CM | POA: Diagnosis not present

## 2014-05-07 DIAGNOSIS — I359 Nonrheumatic aortic valve disorder, unspecified: Secondary | ICD-10-CM

## 2014-05-07 DIAGNOSIS — I714 Abdominal aortic aneurysm, without rupture, unspecified: Secondary | ICD-10-CM

## 2014-05-07 DIAGNOSIS — Z681 Body mass index (BMI) 19 or less, adult: Secondary | ICD-10-CM | POA: Insufficient documentation

## 2014-05-07 DIAGNOSIS — Z86718 Personal history of other venous thrombosis and embolism: Secondary | ICD-10-CM | POA: Diagnosis not present

## 2014-05-07 DIAGNOSIS — C189 Malignant neoplasm of colon, unspecified: Secondary | ICD-10-CM | POA: Insufficient documentation

## 2014-05-07 DIAGNOSIS — I129 Hypertensive chronic kidney disease with stage 1 through stage 4 chronic kidney disease, or unspecified chronic kidney disease: Secondary | ICD-10-CM | POA: Insufficient documentation

## 2014-05-07 DIAGNOSIS — Z01818 Encounter for other preprocedural examination: Secondary | ICD-10-CM | POA: Diagnosis not present

## 2014-05-07 DIAGNOSIS — N189 Chronic kidney disease, unspecified: Secondary | ICD-10-CM | POA: Insufficient documentation

## 2014-05-07 DIAGNOSIS — R002 Palpitations: Secondary | ICD-10-CM | POA: Diagnosis not present

## 2014-05-07 DIAGNOSIS — C4490 Unspecified malignant neoplasm of skin, unspecified: Secondary | ICD-10-CM | POA: Diagnosis not present

## 2014-05-07 DIAGNOSIS — Z01810 Encounter for preprocedural cardiovascular examination: Secondary | ICD-10-CM

## 2014-05-07 DIAGNOSIS — I739 Peripheral vascular disease, unspecified: Secondary | ICD-10-CM | POA: Insufficient documentation

## 2014-05-07 DIAGNOSIS — R9431 Abnormal electrocardiogram [ECG] [EKG]: Secondary | ICD-10-CM | POA: Insufficient documentation

## 2014-05-07 MED ORDER — AMINOPHYLLINE 25 MG/ML IV SOLN
75.0000 mg | Freq: Once | INTRAVENOUS | Status: AC
  Administered 2014-05-07: 75 mg via INTRAVENOUS

## 2014-05-07 MED ORDER — TECHNETIUM TC 99M SESTAMIBI GENERIC - CARDIOLITE
31.2000 | Freq: Once | INTRAVENOUS | Status: AC | PRN
  Administered 2014-05-07: 31.2 via INTRAVENOUS

## 2014-05-07 MED ORDER — TECHNETIUM TC 99M SESTAMIBI GENERIC - CARDIOLITE
10.6000 | Freq: Once | INTRAVENOUS | Status: AC | PRN
  Administered 2014-05-07: 11 via INTRAVENOUS

## 2014-05-07 MED ORDER — REGADENOSON 0.4 MG/5ML IV SOLN
0.4000 mg | Freq: Once | INTRAVENOUS | Status: AC
  Administered 2014-05-07: 0.4 mg via INTRAVENOUS

## 2014-05-07 NOTE — Progress Notes (Signed)
2D Echocardiogram Complete.  05/07/2014   Graham Hyun [place], [place]

## 2014-05-07 NOTE — Procedures (Addendum)
[facility] [address] [place] [address] [phone]  Cardiology Nuclear Med Study  Corey Walker is a 80 y.o. male     MRN : [id number]     DOB: 16-Oct-1917  Procedure Date: 05/07/2014  Nuclear Med Background Indication for Stress Test:  Surgical Clearance and Abnormal EKG History:  Palpitations and murmur;DVT;colon and skin cancer;No prior respiratory history reported;No prior NUC MPI for comparison. Cardiac Risk Factors: Hypertension and PVD  Symptoms:  Pt denies symptoms.   Nuclear Pre-Procedure Caffeine/Decaff Intake:  12:00am NPO After: 10am   IV Site: R Forearm  IV 0.9% NS with Angio Cath:  22g  Chest Size (in):  32"  IV Started by: Rolene Course, RN  Height: 5\' 7"  (1.702 m)  Cup Size: n/a  BMI:  Body mass index is 18.01 kg/(m^2). Weight:  115 lb (52.164 kg)   Tech Comments:  n/a    Nuclear Med Study 1 or 2 day study: 1 day  Stress Test Type:  [place]  Order Authorizing Provider:  Minus Breeding, MD   Resting Radionuclide: Technetium 63m Sestamibi  Resting Radionuclide Dose: 10.6 mCi   Stress Radionuclide:  Technetium 53m Sestamibi  Stress Radionuclide Dose: 31.2 mCi           Stress Protocol Rest HR: 61 Stress HR: 77  Rest BP: 123/71 Stress BP: 116/74  Exercise Time (min): n/a METS: n/a   Predicted Max HR: 126 bpm % Max HR: 72.22 bpm Rate Pressure Product: 11739  Dose of Adenosine (mg):  n/a Dose of Lexiscan: 0.4 mg  Dose of Atropine (mg): n/a Dose of Dobutamine: n/a mcg/kg/min (at max HR)  Stress Test Technologist: Leane Para, CCT Nuclear Technologist: Imagene Riches, CNMT   Rest Procedure:  Myocardial perfusion imaging was performed at rest 45 minutes following the intravenous administration of Technetium 87m Sestamibi. Stress Procedure:  The patient received IV Lexiscan 0.4 mg over 15-seconds.  Technetium 30m Sestamibi injected IV at 30-seconds.  The patient experienced SOB and 75 mg  Aminophylline IV was administered. There were no significant changes with Lexiscan.  Quantitative spect images were obtained after a 45 minute delay.  Transient Ischemic Dilatation (Normal <1.22):  1.06  QGS EDV:  53 ml QGS ESV:  16 ml LV Ejection Fraction: 70%  Rest ECG: Sinus with transient junctional rhythm.  Stress ECG: No significant ST segment change suggestive of ischemia.  QPS Raw Data Images:  Acquisition technically good; normal left ventricular size. Stress Images:  Normal homogeneous uptake in all areas of the myocardium. Rest Images:  Normal homogeneous uptake in all areas of the myocardium. Subtraction (SDS):  No evidence of ischemia.  Impression Exercise Capacity:  Lexiscan with no exercise. BP Response:  Normal blood pressure response. Clinical Symptoms:  There is dyspnea. ECG Impression:  No significant ST segment change suggestive of ischemia. Comparison with Prior Nuclear Study: No previous nuclear study performed  Overall Impression:  Normal stress nuclear study; note transient asymptomatic accelerated intermittent junctional rhythm (HR 61) prior to the study.  LV Wall Motion:  NL LV Function; NL Wall Motion   Kirk Ruths, MD  05/07/2014 4:01 PM

## 2014-05-10 ENCOUNTER — Inpatient Hospital Stay (HOSPITAL_COMMUNITY)
Admission: RE | Admit: 2014-05-10 | Discharge: 2014-05-10 | Disposition: A | Discharge status: Home / Self Care | Payer: Medicare Other | Source: Ambulatory Visit

## 2014-05-10 ENCOUNTER — Telehealth: Payer: Self-pay | Admitting: Cardiology

## 2014-05-10 NOTE — Telephone Encounter (Signed)
Please call,she wants to know what procedure does she need to follow to get a no resuscitate order.If she is not there, please just leave a message.

## 2014-05-10 NOTE — Telephone Encounter (Signed)
Returned a call to patient's daughter. Questions answered to her satisfaction in reference to him getting a DNR form to have completed and put on file.

## 2014-05-10 NOTE — Pre-Procedure Instructions (Signed)
JHAYDEN DEMURO  05/10/2014   Your procedure is scheduled on:  Tuesday, December 29th  Report to [facility] Admitting at 530 AM.  Call this number if you have problems the morning of surgery: [phone]   Remember:   Do not eat food or drink liquids after midnight.   Take these medicines the morning of surgery with A SIP OF WATER: synthroid, prednisone, pulmicort   Do not wear jewelry.  Do not wear lotions, powders, or perfume,deodorant.  Do not shave 48 hours prior to surgery. Men may shave face and neck.  Do not bring valuables to the hospital.  [facility] is not responsible for any belongings or valuables.               Contacts, dentures or bridgework may not be worn into surgery.  Leave suitcase in the car. After surgery it may be brought to your room.  For patients admitted to the hospital, discharge time is determined by your  treatment team.      Please read over the following fact sheets that you were given: Pain Booklet, Coughing and Deep Breathing, Blood Transfusion Information, MRSA Information and Surgical Site Infection Prevention  [facility] - Preparing for Surgery  Before surgery, you can play an important role.  Because skin is not sterile, your skin needs to be as free of germs as possible.  You can reduce the number of germs on you skin by washing with CHG (chlorahexidine gluconate) soap before surgery.  CHG is an antiseptic cleaner which kills germs and bonds with the skin to continue killing germs even after washing.  Please DO NOT use if you have an allergy to CHG or antibacterial soaps.  If your skin becomes reddened/irritated stop using the CHG and inform your nurse when you arrive at Short Stay.  Do not shave (including legs and underarms) for at least 48 hours prior to the first CHG shower.  You may shave your face.  Please follow these instructions carefully:   1.  Shower with CHG Soap the night before surgery and the morning of Surgery.  2.   If you choose to wash your hair, wash your hair first as usual with your normal shampoo.  3.  After you shampoo, rinse your hair and body thoroughly to remove the shampoo.  4.  Use CHG as you would any other liquid soap.  You can apply CHG directly to the skin and wash gently with scrungie or a clean washcloth.  5.  Apply the CHG Soap to your body ONLY FROM THE NECK DOWN.  Do not use on open wounds or open sores.  Avoid contact with your eyes, ears, mouth and genitals (private parts).  Wash genitals (private parts) with your normal soap.  6.  Wash thoroughly, paying special attention to the area where your surgery will be performed.  7.  Thoroughly rinse your body with warm water from the neck down.  8.  DO NOT shower/wash with your normal soap after using and rinsing off the CHG Soap.  9.  Pat yourself dry with a clean towel.            10.  Wear clean pajamas.            11.  Place clean sheets on your bed the night of your first shower and do not sleep with pets.  Day of Surgery  Do not apply any lotions/deoderants the morning of surgery.  Please wear clean  clothes to the hospital/surgery center.

## 2014-05-12 ENCOUNTER — Telehealth: Payer: Self-pay | Admitting: Family Medicine

## 2014-05-12 ENCOUNTER — Other Ambulatory Visit: Payer: Self-pay | Admitting: Family Medicine

## 2014-05-12 ENCOUNTER — Telehealth: Payer: Self-pay | Admitting: *Deleted

## 2014-05-12 MED ORDER — ALPRAZOLAM 0.25 MG PO TABS: 0.2500 mg | ORAL_TABLET | Freq: Two times a day (BID) | ORAL | Status: DC | PRN

## 2014-05-12 NOTE — Telephone Encounter (Signed)
RX for Xanax faxed into [facility] for pt's daughter and called [facility] to inform

## 2014-05-12 NOTE — Telephone Encounter (Signed)
Surgical clearance letter faxed to [facility].

## 2014-05-12 NOTE — Telephone Encounter (Signed)
Xanax rx'd and can come in and p/u or call in, rx is ready

## 2014-05-12 NOTE — Telephone Encounter (Signed)
-----   Message from Brett Canales, PA-C sent at 05/12/2014  1:20 PM EST ----- Please send cardiac clearance letter.  State in the letter that the patient had a low risk stress test.   Thanks, Gaspar Bidding

## 2014-05-13 ENCOUNTER — Telehealth: Payer: Self-pay | Admitting: Family Medicine

## 2014-05-13 ENCOUNTER — Other Ambulatory Visit (HOSPITAL_COMMUNITY): Payer: Medicare Other

## 2014-05-14 ENCOUNTER — Encounter (HOSPITAL_COMMUNITY): Payer: Self-pay | Admitting: Pharmacy Technician

## 2014-05-17 ENCOUNTER — Encounter (HOSPITAL_COMMUNITY)
Admission: RE | Admit: 2014-05-17 | Discharge: 2014-05-17 | Disposition: A | Discharge status: Home / Self Care | Payer: Medicare Other | Source: Ambulatory Visit | Attending: Vascular Surgery | Admitting: Vascular Surgery

## 2014-05-17 ENCOUNTER — Encounter (HOSPITAL_COMMUNITY): Payer: Self-pay

## 2014-05-17 DIAGNOSIS — Z01812 Encounter for preprocedural laboratory examination: Secondary | ICD-10-CM | POA: Insufficient documentation

## 2014-05-17 HISTORY — DX: Hypothyroidism, unspecified: E03.9

## 2014-05-17 LAB — SURGICAL PCR SCREEN
MRSA, PCR: NEGATIVE
STAPHYLOCOCCUS AUREUS: POSITIVE — AB

## 2014-05-17 LAB — COMPREHENSIVE METABOLIC PANEL
ALK PHOS: 160 U/L — AB (ref 39–117)
ALT: 18 U/L (ref 0–53)
ANION GAP: 15 (ref 5–15)
AST: 30 U/L (ref 0–37)
Albumin: 3.7 g/dL (ref 3.5–5.2)
BILIRUBIN TOTAL: 0.7 mg/dL (ref 0.3–1.2)
BUN: 16 mg/dL (ref 6–23)
CHLORIDE: 107 meq/L (ref 96–112)
CO2: 20 mEq/L (ref 19–32)
CREATININE: 0.87 mg/dL (ref 0.50–1.35)
Calcium: 9.3 mg/dL (ref 8.4–10.5)
GFR, EST AFRICAN AMERICAN: 83 mL/min — AB (ref >90)
GFR, EST NON AFRICAN AMERICAN: 72 mL/min — AB (ref >90)
GLUCOSE: 92 mg/dL (ref 70–99)
POTASSIUM: 4 meq/L (ref 3.7–5.3)
Sodium: 142 mEq/L (ref 137–147)
Total Protein: 7.6 g/dL (ref 6.0–8.3)

## 2014-05-17 LAB — URINALYSIS, ROUTINE W REFLEX MICROSCOPIC
BILIRUBIN URINE: NEGATIVE
Glucose, UA: NEGATIVE mg/dL
Ketones, ur: NEGATIVE mg/dL
Nitrite: NEGATIVE
PH: 5.5 (ref 5.0–8.0)
Protein, ur: NEGATIVE mg/dL
SPECIFIC GRAVITY, URINE: 1.018 (ref 1.005–1.030)
Urobilinogen, UA: 0.2 mg/dL (ref 0.0–1.0)

## 2014-05-17 LAB — PROTIME-INR
INR: 0.98 (ref 0.00–1.49)
PROTHROMBIN TIME: 13.1 s (ref 11.6–15.2)

## 2014-05-17 LAB — BLOOD GAS, ARTERIAL
ACID-BASE DEFICIT: 2.7 mmol/L — AB (ref 0.0–2.0)
BICARBONATE: 21 meq/L (ref 20.0–24.0)
Drawn by: 42180
FIO2: 0.21 %
O2 Saturation: 98.6 %
PATIENT TEMPERATURE: 98.6
PCO2 ART: 32.3 mmHg — AB (ref 35.0–45.0)
PH ART: 7.428 (ref 7.350–7.450)
TCO2: 22 mmol/L (ref 0–100)
pO2, Arterial: 108 mmHg — ABNORMAL HIGH (ref 80.0–100.0)

## 2014-05-17 LAB — CBC
HCT: 41.4 % (ref 39.0–52.0)
HEMOGLOBIN: 13.3 g/dL (ref 13.0–17.0)
MCH: 28.9 pg (ref 26.0–34.0)
MCHC: 32.1 g/dL (ref 30.0–36.0)
MCV: 90 fL (ref 78.0–100.0)
Platelets: 153 10*3/uL (ref 150–400)
RBC: 4.6 MIL/uL (ref 4.22–5.81)
RDW: 14.3 % (ref 11.5–15.5)
WBC: 6.1 10*3/uL (ref 4.0–10.5)

## 2014-05-17 LAB — URINE MICROSCOPIC-ADD ON

## 2014-05-17 LAB — ABO/RH: ABO/RH(D): A POS

## 2014-05-17 LAB — APTT: aPTT: 33 seconds (ref 24–37)

## 2014-05-17 LAB — TYPE AND SCREEN
ABO/RH(D): A POS
Antibody Screen: NEGATIVE

## 2014-05-17 NOTE — Progress Notes (Signed)
Pt. Has been reviewed by Dr. Percival Spanish, by myocardial perfusion imaging & echo. Followed by [facility]. Pt. Denies cardiac complaints.

## 2014-05-18 NOTE — Progress Notes (Signed)
Mupirocin Ointment Rx called into [facility] [phone]) for positive PCR of staph.

## 2014-05-24 MED ORDER — VANCOMYCIN HCL IN DEXTROSE 1-5 GM/200ML-% IV SOLN
1000.0000 mg | INTRAVENOUS | Status: AC
Start: 2014-05-25 — End: 2014-05-25
  Administered 2014-05-25: 1000 mg via INTRAVENOUS

## 2014-05-25 ENCOUNTER — Inpatient Hospital Stay (HOSPITAL_COMMUNITY): Payer: Medicare Other

## 2014-05-25 ENCOUNTER — Encounter (HOSPITAL_COMMUNITY)
Admission: RE | Disposition: A | Discharge status: Home / Self Care | Payer: Self-pay | Source: Ambulatory Visit | Attending: Vascular Surgery

## 2014-05-25 ENCOUNTER — Other Ambulatory Visit: Payer: Self-pay | Admitting: *Deleted

## 2014-05-25 ENCOUNTER — Inpatient Hospital Stay (HOSPITAL_COMMUNITY)
Admission: RE | Admit: 2014-05-25 | Discharge: 2014-05-26 | DRG: 269 | Disposition: A | Discharge status: Home / Self Care | Payer: Medicare Other | Source: Ambulatory Visit | Attending: Vascular Surgery | Admitting: Vascular Surgery

## 2014-05-25 ENCOUNTER — Inpatient Hospital Stay (HOSPITAL_COMMUNITY): Payer: Medicare Other | Admitting: Certified Registered Nurse Anesthetist

## 2014-05-25 ENCOUNTER — Encounter (HOSPITAL_COMMUNITY): Payer: Self-pay | Admitting: Surgery

## 2014-05-25 DIAGNOSIS — I10 Essential (primary) hypertension: Secondary | ICD-10-CM | POA: Diagnosis present

## 2014-05-25 DIAGNOSIS — N289 Disorder of kidney and ureter, unspecified: Secondary | ICD-10-CM | POA: Diagnosis present

## 2014-05-25 DIAGNOSIS — Z23 Encounter for immunization: Secondary | ICD-10-CM

## 2014-05-25 DIAGNOSIS — I739 Peripheral vascular disease, unspecified: Secondary | ICD-10-CM | POA: Diagnosis present

## 2014-05-25 DIAGNOSIS — K4090 Unilateral inguinal hernia, without obstruction or gangrene, not specified as recurrent: Secondary | ICD-10-CM | POA: Diagnosis present

## 2014-05-25 DIAGNOSIS — Z85038 Personal history of other malignant neoplasm of large intestine: Secondary | ICD-10-CM

## 2014-05-25 DIAGNOSIS — I714 Abdominal aortic aneurysm, without rupture, unspecified: Secondary | ICD-10-CM | POA: Diagnosis present

## 2014-05-25 DIAGNOSIS — Z7952 Long term (current) use of systemic steroids: Secondary | ICD-10-CM

## 2014-05-25 DIAGNOSIS — N390 Urinary tract infection, site not specified: Secondary | ICD-10-CM | POA: Diagnosis present

## 2014-05-25 DIAGNOSIS — N400 Enlarged prostate without lower urinary tract symptoms: Secondary | ICD-10-CM | POA: Diagnosis present

## 2014-05-25 DIAGNOSIS — Z95828 Presence of other vascular implants and grafts: Secondary | ICD-10-CM

## 2014-05-25 DIAGNOSIS — M353 Polymyalgia rheumatica: Secondary | ICD-10-CM | POA: Diagnosis present

## 2014-05-25 DIAGNOSIS — R011 Cardiac murmur, unspecified: Secondary | ICD-10-CM | POA: Diagnosis present

## 2014-05-25 DIAGNOSIS — M810 Age-related osteoporosis without current pathological fracture: Secondary | ICD-10-CM | POA: Diagnosis present

## 2014-05-25 DIAGNOSIS — E039 Hypothyroidism, unspecified: Secondary | ICD-10-CM | POA: Diagnosis present

## 2014-05-25 DIAGNOSIS — Z85828 Personal history of other malignant neoplasm of skin: Secondary | ICD-10-CM

## 2014-05-25 DIAGNOSIS — Z419 Encounter for procedure for purposes other than remedying health state, unspecified: Secondary | ICD-10-CM

## 2014-05-25 HISTORY — PX: ABDOMINAL AORTIC ENDOVASCULAR STENT GRAFT: SHX5707

## 2014-05-25 LAB — BASIC METABOLIC PANEL
Anion gap: 8 (ref 5–15)
BUN: 20 mg/dL (ref 6–23)
CHLORIDE: 109 meq/L (ref 96–112)
CO2: 20 mmol/L (ref 19–32)
Calcium: 8.4 mg/dL (ref 8.4–10.5)
Creatinine, Ser: 0.95 mg/dL (ref 0.50–1.35)
GFR calc non Af Amer: 69 mL/min — ABNORMAL LOW (ref >90)
GFR, EST AFRICAN AMERICAN: 80 mL/min — AB (ref >90)
GLUCOSE: 101 mg/dL — AB (ref 70–99)
Potassium: 5 mmol/L (ref 3.5–5.1)
Sodium: 137 mmol/L (ref 135–145)

## 2014-05-25 LAB — URINALYSIS, ROUTINE W REFLEX MICROSCOPIC
Bilirubin Urine: NEGATIVE
Glucose, UA: NEGATIVE mg/dL
KETONES UR: NEGATIVE mg/dL
NITRITE: NEGATIVE
PROTEIN: NEGATIVE mg/dL
Specific Gravity, Urine: 1.031 — ABNORMAL HIGH (ref 1.005–1.030)
UROBILINOGEN UA: 0.2 mg/dL (ref 0.0–1.0)
pH: 5.5 (ref 5.0–8.0)

## 2014-05-25 LAB — URINE MICROSCOPIC-ADD ON

## 2014-05-25 LAB — CBC
HEMATOCRIT: 34.9 % — AB (ref 39.0–52.0)
HEMOGLOBIN: 11.1 g/dL — AB (ref 13.0–17.0)
MCH: 28.5 pg (ref 26.0–34.0)
MCHC: 31.8 g/dL (ref 30.0–36.0)
MCV: 89.7 fL (ref 78.0–100.0)
Platelets: 126 10*3/uL — ABNORMAL LOW (ref 150–400)
RBC: 3.89 MIL/uL — ABNORMAL LOW (ref 4.22–5.81)
RDW: 14.6 % (ref 11.5–15.5)
WBC: 6.6 10*3/uL (ref 4.0–10.5)

## 2014-05-25 LAB — MAGNESIUM: Magnesium: 1.9 mg/dL (ref 1.5–2.5)

## 2014-05-25 SURGERY — INSERTION, ENDOVASCULAR STENT GRAFT, AORTA, ABDOMINAL
Anesthesia: General

## 2014-05-25 MED ORDER — SODIUM CHLORIDE 0.9 % IV SOLN
INTRAVENOUS | Status: DC
  Administered 2014-05-25: 12:00:00 via INTRAVENOUS

## 2014-05-25 MED ORDER — 0.9 % SODIUM CHLORIDE (POUR BTL) OPTIME
TOPICAL | Status: DC | PRN
  Administered 2014-05-25: 1000 mL

## 2014-05-25 MED ORDER — IODIXANOL 320 MG/ML IV SOLN
INTRAVENOUS | Status: DC | PRN
  Administered 2014-05-25: 60.2 mL

## 2014-05-25 MED ORDER — PROPOFOL 10 MG/ML IV BOLUS
INTRAVENOUS | Status: AC
  Filled 2014-05-25: qty 20

## 2014-05-25 MED ORDER — MORPHINE SULFATE 2 MG/ML IJ SOLN
1.0000 mg | INTRAMUSCULAR | Status: DC | PRN
  Administered 2014-05-25 (×2): 1 mg via INTRAVENOUS
  Filled 2014-05-25 (×2): qty 1

## 2014-05-25 MED ORDER — MICONAZOLE NITRATE 2 % EX CREA
1.0000 | TOPICAL_CREAM | Freq: Two times a day (BID) | CUTANEOUS | Status: DC
Start: 2014-05-25 — End: 2014-05-26
  Administered 2014-05-25 – 2014-05-26 (×3): 1 via TOPICAL
  Filled 2014-05-25: qty 14

## 2014-05-25 MED ORDER — LIDOCAINE HCL (CARDIAC) 20 MG/ML IV SOLN
INTRAVENOUS | Status: DC | PRN
  Administered 2014-05-25: 60 mg via INTRAVENOUS

## 2014-05-25 MED ORDER — CIPROFLOXACIN HCL 500 MG PO TABS: 500.0000 mg | ORAL_TABLET | Freq: Two times a day (BID) | ORAL | Status: DC

## 2014-05-25 MED ORDER — PROTAMINE SULFATE 10 MG/ML IV SOLN
INTRAVENOUS | Status: DC | PRN
  Administered 2014-05-25: 15 mg via INTRAVENOUS
  Administered 2014-05-25: 20 mg via INTRAVENOUS
  Administered 2014-05-25: 5 mg via INTRAVENOUS
  Administered 2014-05-25: 10 mg via INTRAVENOUS

## 2014-05-25 MED ORDER — EMETROL 1.87-1.87-21.5 PO SOLN: 5.0000 mL | ORAL | Status: DC | PRN

## 2014-05-25 MED ORDER — VANCOMYCIN HCL IN DEXTROSE 1-5 GM/200ML-% IV SOLN
1000.0000 mg | Freq: Two times a day (BID) | INTRAVENOUS | Status: AC
  Administered 2014-05-26: 1000 mg via INTRAVENOUS
  Filled 2014-05-25: qty 200

## 2014-05-25 MED ORDER — LISINOPRIL 10 MG PO TABS
10.0000 mg | ORAL_TABLET | Freq: Every day | ORAL | Status: DC
  Administered 2014-05-25 – 2014-05-26 (×2): 10 mg via ORAL
  Filled 2014-05-25 (×2): qty 1

## 2014-05-25 MED ORDER — METOPROLOL TARTRATE 1 MG/ML IV SOLN
2.0000 mg | INTRAVENOUS | Status: DC | PRN
Start: 2014-05-25 — End: 2014-05-26

## 2014-05-25 MED ORDER — POTASSIUM CHLORIDE CRYS ER 20 MEQ PO TBCR: 20.0000 meq | EXTENDED_RELEASE_TABLET | Freq: Every day | ORAL | Status: DC | PRN

## 2014-05-25 MED ORDER — VANCOMYCIN HCL IN DEXTROSE 1-5 GM/200ML-% IV SOLN
1000.0000 mg | Freq: Two times a day (BID) | INTRAVENOUS | Status: DC
  Filled 2014-05-25: qty 200

## 2014-05-25 MED ORDER — LEVOTHYROXINE SODIUM 50 MCG PO TABS
50.0000 ug | ORAL_TABLET | Freq: Every day | ORAL | Status: DC
Start: 2014-05-26 — End: 2014-05-25
  Filled 2014-05-25: qty 1

## 2014-05-25 MED ORDER — LOPERAMIDE HCL 2 MG PO CAPS
2.0000 mg | ORAL_CAPSULE | ORAL | Status: DC | PRN
  Filled 2014-05-25: qty 1

## 2014-05-25 MED ORDER — HEPARIN SODIUM (PORCINE) 5000 UNIT/ML IJ SOLN
INTRAMUSCULAR | Status: DC | PRN
  Administered 2014-05-25: 500 mL

## 2014-05-25 MED ORDER — DOCUSATE SODIUM 100 MG PO CAPS
100.0000 mg | ORAL_CAPSULE | Freq: Every day | ORAL | Status: DC
  Administered 2014-05-26: 100 mg via ORAL
  Filled 2014-05-25: qty 1

## 2014-05-25 MED ORDER — FENTANYL CITRATE 0.05 MG/ML IJ SOLN
25.0000 ug | INTRAMUSCULAR | Status: DC | PRN
  Administered 2014-05-25 (×4): 25 ug via INTRAVENOUS

## 2014-05-25 MED ORDER — FERROUS SULFATE 325 (65 FE) MG PO TABS
325.0000 mg | ORAL_TABLET | Freq: Every day | ORAL | Status: DC
  Administered 2014-05-26: 325 mg via ORAL
  Filled 2014-05-25 (×2): qty 1

## 2014-05-25 MED ORDER — HYDRALAZINE HCL 20 MG/ML IJ SOLN: 5.0000 mg | INTRAMUSCULAR | Status: DC | PRN

## 2014-05-25 MED ORDER — PHENOL 1.4 % MT LIQD: 1.0000 | OROMUCOSAL | Status: DC | PRN

## 2014-05-25 MED ORDER — LACTATED RINGERS IV SOLN
INTRAVENOUS | Status: DC | PRN
  Administered 2014-05-25: 08:00:00 via INTRAVENOUS

## 2014-05-25 MED ORDER — NEOSTIGMINE METHYLSULFATE 10 MG/10ML IV SOLN
INTRAVENOUS | Status: DC | PRN
  Administered 2014-05-25: 2 mg via INTRAVENOUS

## 2014-05-25 MED ORDER — SUCCINYLCHOLINE CHLORIDE 20 MG/ML IJ SOLN
INTRAMUSCULAR | Status: DC | PRN
Start: 2014-05-25 — End: 2014-05-25
  Administered 2014-05-25: 50 mg via INTRAVENOUS

## 2014-05-25 MED ORDER — SODIUM CHLORIDE 0.9 % IV SOLN: 500.0000 mL | Freq: Once | INTRAVENOUS | Status: AC | PRN

## 2014-05-25 MED ORDER — LACTATED RINGERS IV SOLN
INTRAVENOUS | Status: DC | PRN
  Administered 2014-05-25: 07:00:00 via INTRAVENOUS

## 2014-05-25 MED ORDER — ENSURE IMMUNE HEALTH PO LIQD: 237.0000 mL | Freq: Two times a day (BID) | ORAL | Status: DC

## 2014-05-25 MED ORDER — ENSURE COMPLETE PO LIQD
237.0000 mL | Freq: Two times a day (BID) | ORAL | Status: DC
  Administered 2014-05-25 – 2014-05-26 (×2): 237 mL via ORAL

## 2014-05-25 MED ORDER — FLUTICASONE PROPIONATE HFA 44 MCG/ACT IN AERO
2.0000 | INHALATION_SPRAY | Freq: Two times a day (BID) | RESPIRATORY_TRACT | Status: DC
  Administered 2014-05-25 – 2014-05-26 (×2): 2 via RESPIRATORY_TRACT
  Filled 2014-05-25: qty 10.6

## 2014-05-25 MED ORDER — LEVOTHYROXINE SODIUM 50 MCG PO TABS
50.0000 ug | ORAL_TABLET | Freq: Every day | ORAL | Status: DC
  Administered 2014-05-26: 50 ug via ORAL
  Filled 2014-05-25 (×2): qty 1

## 2014-05-25 MED ORDER — PROMETHAZINE HCL 25 MG PO TABS: 12.5000 mg | ORAL_TABLET | Freq: Three times a day (TID) | ORAL | Status: DC | PRN

## 2014-05-25 MED ORDER — FUROSEMIDE 20 MG PO TABS
20.0000 mg | ORAL_TABLET | ORAL | Status: DC
Start: 2014-05-25 — End: 2014-05-26
  Administered 2014-05-25: 20 mg via ORAL
  Filled 2014-05-25: qty 1

## 2014-05-25 MED ORDER — FENTANYL CITRATE 0.05 MG/ML IJ SOLN
INTRAMUSCULAR | Status: AC
  Administered 2014-05-25: 25 ug via INTRAVENOUS
  Filled 2014-05-25: qty 2

## 2014-05-25 MED ORDER — ALPRAZOLAM 0.25 MG PO TABS: 0.2500 mg | ORAL_TABLET | Freq: Two times a day (BID) | ORAL | Status: DC | PRN

## 2014-05-25 MED ORDER — ONDANSETRON HCL 4 MG/2ML IJ SOLN: 4.0000 mg | Freq: Four times a day (QID) | INTRAMUSCULAR | Status: DC | PRN

## 2014-05-25 MED ORDER — GUAIFENESIN-DM 100-10 MG/5ML PO SYRP: 15.0000 mL | ORAL_SOLUTION | ORAL | Status: DC | PRN

## 2014-05-25 MED ORDER — CIPROFLOXACIN HCL 500 MG PO TABS
500.0000 mg | ORAL_TABLET | Freq: Two times a day (BID) | ORAL | Status: DC
  Administered 2014-05-25 – 2014-05-26 (×2): 500 mg via ORAL
  Filled 2014-05-25 (×6): qty 1

## 2014-05-25 MED ORDER — PROPOFOL 10 MG/ML IV BOLUS
INTRAVENOUS | Status: DC | PRN
  Administered 2014-05-25: 50 mg via INTRAVENOUS

## 2014-05-25 MED ORDER — ACETAMINOPHEN 500 MG PO TABS
500.0000 mg | ORAL_TABLET | ORAL | Status: DC | PRN
  Administered 2014-05-25: 500 mg via ORAL
  Filled 2014-05-25: qty 1

## 2014-05-25 MED ORDER — PANTOPRAZOLE SODIUM 40 MG PO TBEC
40.0000 mg | DELAYED_RELEASE_TABLET | Freq: Every day | ORAL | Status: DC
  Administered 2014-05-25 – 2014-05-26 (×2): 40 mg via ORAL
  Filled 2014-05-25 (×2): qty 1

## 2014-05-25 MED ORDER — DEXTROSE 5 % IV SOLN
10.0000 mg | INTRAVENOUS | Status: DC | PRN
  Administered 2014-05-25: 5 ug/min via INTRAVENOUS

## 2014-05-25 MED ORDER — ONDANSETRON HCL 4 MG/2ML IJ SOLN
INTRAMUSCULAR | Status: DC | PRN
  Administered 2014-05-25: 4 mg via INTRAVENOUS

## 2014-05-25 MED ORDER — PROTAMINE SULFATE 10 MG/ML IV SOLN
INTRAVENOUS | Status: AC
  Filled 2014-05-25: qty 5

## 2014-05-25 MED ORDER — ROCURONIUM BROMIDE 100 MG/10ML IV SOLN
INTRAVENOUS | Status: DC | PRN
  Administered 2014-05-25: 20 mg via INTRAVENOUS

## 2014-05-25 MED ORDER — PREDNISONE 5 MG PO TABS
5.0000 mg | ORAL_TABLET | Freq: Two times a day (BID) | ORAL | Status: DC
  Administered 2014-05-25 – 2014-05-26 (×2): 5 mg via ORAL
  Filled 2014-05-25 (×4): qty 1

## 2014-05-25 MED ORDER — ARTIFICIAL TEARS OP OINT
TOPICAL_OINTMENT | OPHTHALMIC | Status: DC | PRN
  Administered 2014-05-25: 1 via OPHTHALMIC

## 2014-05-25 MED ORDER — DONEPEZIL HCL 5 MG PO TABS
5.0000 mg | ORAL_TABLET | Freq: Every day | ORAL | Status: DC
  Administered 2014-05-25: 5 mg via ORAL
  Filled 2014-05-25 (×2): qty 1

## 2014-05-25 MED ORDER — HEPARIN SODIUM (PORCINE) 1000 UNIT/ML IJ SOLN
INTRAMUSCULAR | Status: DC | PRN
  Administered 2014-05-25: 5000 [IU] via INTRAVENOUS

## 2014-05-25 MED ORDER — EPHEDRINE SULFATE 50 MG/ML IJ SOLN
INTRAMUSCULAR | Status: DC | PRN
  Administered 2014-05-25 (×3): 5 mg via INTRAVENOUS

## 2014-05-25 MED ORDER — PHENYLEPHRINE HCL 10 MG/ML IJ SOLN
INTRAMUSCULAR | Status: DC | PRN
  Administered 2014-05-25 (×2): 40 ug via INTRAVENOUS

## 2014-05-25 MED ORDER — CYANOCOBALAMIN 500 MCG PO TABS
500.0000 ug | ORAL_TABLET | Freq: Every day | ORAL | Status: DC
  Administered 2014-05-25 – 2014-05-26 (×2): 500 ug via ORAL
  Filled 2014-05-25 (×2): qty 1

## 2014-05-25 MED ORDER — ALUM & MAG HYDROXIDE-SIMETH 200-200-20 MG/5ML PO SUSP: 15.0000 mL | ORAL | Status: DC | PRN

## 2014-05-25 MED ORDER — LABETALOL HCL 5 MG/ML IV SOLN: 10.0000 mg | INTRAVENOUS | Status: DC | PRN

## 2014-05-25 MED ORDER — FENTANYL CITRATE 0.05 MG/ML IJ SOLN
INTRAMUSCULAR | Status: DC | PRN
  Administered 2014-05-25: 75 ug via INTRAVENOUS
  Administered 2014-05-25 (×2): 25 ug via INTRAVENOUS

## 2014-05-25 MED ORDER — FENTANYL CITRATE 0.05 MG/ML IJ SOLN
INTRAMUSCULAR | Status: AC
  Filled 2014-05-25: qty 5

## 2014-05-25 MED ORDER — CHOLECALCIFEROL 10 MCG (400 UNIT) PO TABS
400.0000 [IU] | ORAL_TABLET | Freq: Two times a day (BID) | ORAL | Status: DC
  Administered 2014-05-25 – 2014-05-26 (×3): 400 [IU] via ORAL
  Filled 2014-05-25 (×4): qty 1

## 2014-05-25 MED ORDER — ENOXAPARIN SODIUM 30 MG/0.3ML ~~LOC~~ SOLN
30.0000 mg | SUBCUTANEOUS | Status: DC
  Filled 2014-05-25: qty 0.3

## 2014-05-25 MED ORDER — GLYCOPYRROLATE 0.2 MG/ML IJ SOLN
INTRAMUSCULAR | Status: DC | PRN
  Administered 2014-05-25: 0.1 mg via INTRAVENOUS
  Administered 2014-05-25: .1 mg via INTRAVENOUS
  Administered 2014-05-25: .2 mg via INTRAVENOUS

## 2014-05-25 SURGICAL SUPPLY — 63 items
BAG BANDED W/RUBBER/TAPE 36X54 (MISCELLANEOUS) ×2 IMPLANT
BAG SNAP BAND KOVER 36X36 (MISCELLANEOUS) ×2 IMPLANT
CANISTER SUCTION 2500CC (MISCELLANEOUS) ×2 IMPLANT
CATH BEACON 5.038 65CM KMP-01 (CATHETERS) ×2 IMPLANT
CATH OMNI FLUSH .035X70CM (CATHETERS) ×2 IMPLANT
CLIP TI MEDIUM 24 (CLIP) IMPLANT
CLIP TI WIDE RED SMALL 24 (CLIP) IMPLANT
COVER DOME SNAP 22 D (MISCELLANEOUS) ×2 IMPLANT
COVER MAYO STAND STRL (DRAPES) ×2 IMPLANT
COVER PROBE W GEL 5X96 (DRAPES) ×2 IMPLANT
COVER SURGICAL LIGHT HANDLE (MISCELLANEOUS) ×2 IMPLANT
DEVICE CLOSURE PERCLS PRGLD 6F (VASCULAR PRODUCTS) ×4 IMPLANT
DRAPE TABLE COVER HEAVY DUTY (DRAPES) ×2 IMPLANT
DRSG TEGADERM 2-3/8X2-3/4 SM (GAUZE/BANDAGES/DRESSINGS) ×4 IMPLANT
DRYSEAL FLEXSHEATH 12FR 33CM (SHEATH) ×1
DRYSEAL FLEXSHEATH 16FR 33CM (SHEATH) ×1
ELECT CAUTERY BLADE 6.4 (BLADE) ×2 IMPLANT
ELECT REM PT RETURN 9FT ADLT (ELECTROSURGICAL) ×4
ELECTRODE REM PT RTRN 9FT ADLT (ELECTROSURGICAL) ×2 IMPLANT
EXCLUDER TNK LEG 23MX14X16 (Endovascular Graft) ×1 IMPLANT
EXCLUDER TRUNK LEG 23MX14X16 (Endovascular Graft) ×2 IMPLANT
GLOVE BIO SURGEON STRL SZ7.5 (GLOVE) ×2 IMPLANT
GOWN STRL REUS W/ TWL LRG LVL3 (GOWN DISPOSABLE) ×3 IMPLANT
GOWN STRL REUS W/TWL LRG LVL3 (GOWN DISPOSABLE) ×3
GRAFT BALLN CATH 65CM (STENTS) ×1 IMPLANT
GRAFT EXCLUDER LEG 14.5X12 (Endovascular Graft) ×2 IMPLANT
INSERT FOGARTY 61MM (MISCELLANEOUS) IMPLANT
INSERT FOGARTY SM (MISCELLANEOUS) IMPLANT
KIT BASIN OR (CUSTOM PROCEDURE TRAY) ×2 IMPLANT
KIT ROOM TURNOVER OR (KITS) ×2 IMPLANT
LIQUID BAND (GAUZE/BANDAGES/DRESSINGS) ×2 IMPLANT
NEEDLE PERC 18GX7CM (NEEDLE) ×2 IMPLANT
NS IRRIG 1000ML POUR BTL (IV SOLUTION) ×2 IMPLANT
PACK AORTA (CUSTOM PROCEDURE TRAY) ×2 IMPLANT
PAD ARMBOARD 7.5X6 YLW CONV (MISCELLANEOUS) ×4 IMPLANT
PENCIL BUTTON HOLSTER BLD 10FT (ELECTRODE) IMPLANT
PERCLOSE PROGLIDE 6F (VASCULAR PRODUCTS) ×8
PROBE PENCIL 8 MHZ STRL DISP (MISCELLANEOUS) ×2 IMPLANT
PROTECTION STATION PRESSURIZED (MISCELLANEOUS) ×2
SHEATH AVANTI 11CM 8FR (MISCELLANEOUS) ×2 IMPLANT
SHEATH BRITE TIP 8FR 23CM (MISCELLANEOUS) ×2 IMPLANT
SHEATH DRYSEAL FLEX 12FR 33CM (SHEATH) ×1 IMPLANT
SHEATH DRYSEAL FLEX 16FR 33CM (SHEATH) ×1 IMPLANT
SPONGE SURGIFOAM ABS GEL 100 (HEMOSTASIS) IMPLANT
STAPLER VISISTAT 35W (STAPLE) IMPLANT
STATION PROTECTION PRESSURIZED (MISCELLANEOUS) ×1 IMPLANT
STENT GRAFT BALLN CATH 65CM (STENTS) ×1
STOPCOCK MORSE 400PSI 3WAY (MISCELLANEOUS) ×2 IMPLANT
SUT PROLENE 5 0 C 1 24 (SUTURE) IMPLANT
SUT VIC AB 2-0 CT1 27 (SUTURE)
SUT VIC AB 2-0 CT1 TAPERPNT 27 (SUTURE) IMPLANT
SUT VIC AB 3-0 SH 27 (SUTURE)
SUT VIC AB 3-0 SH 27X BRD (SUTURE) IMPLANT
SUT VICRYL 4-0 PS2 18IN ABS (SUTURE) ×4 IMPLANT
SYR 20CC LL (SYRINGE) ×4 IMPLANT
SYR 30ML LL (SYRINGE) IMPLANT
SYR 5ML LL (SYRINGE) ×2 IMPLANT
SYR MEDRAD MARK V 150ML (SYRINGE) ×2 IMPLANT
SYRINGE 10CC LL (SYRINGE) ×6 IMPLANT
TRAY FOLEY CATH 16FRSI W/METER (SET/KITS/TRAYS/PACK) ×2 IMPLANT
TUBING HIGH PRESSURE 120CM (CONNECTOR) ×2 IMPLANT
WIRE AMPLATZ SS-J .035X180CM (WIRE) ×4 IMPLANT
WIRE BENTSON .035X145CM (WIRE) ×4 IMPLANT

## 2014-05-25 NOTE — Anesthesia Procedure Notes (Signed)
Procedure Name: Intubation Date/Time: 05/25/2014 8:16 AM Performed by: Garner Nash Pre-anesthesia Checklist: Patient identified, Emergency Drugs available, Suction available, Patient being monitored and Timeout performed Patient Re-evaluated:Patient Re-evaluated prior to inductionOxygen Delivery Method: Circle system utilized Preoxygenation: Pre-oxygenation with 100% oxygen Intubation Type: IV induction Ventilation: Mask ventilation without difficulty Laryngoscope Size: Mac and 3 Grade View: Grade I Tube type: Oral Tube size: 7.5 mm Number of attempts: 1 Airway Equipment and Method: Stylet Placement Confirmation: ETT inserted through vocal cords under direct vision,  positive ETCO2,  CO2 detector and breath sounds checked- equal and bilateral Secured at: 22 cm Tube secured with: Tape Dental Injury: Teeth and Oropharynx as per pre-operative assessment

## 2014-05-25 NOTE — Op Note (Addendum)
Operative Note: Gore Excluder Stent Graft repair of AAA  Preop: AAA Post op: AAA Asst: Samantha Rhyne PA-C Anesthesia: General  Operative findings:   #1 Bilateral Proglide closure   #2 23 x14 x16 cm main body Gore Excluder device delivered via left femoral system   #3 14 x 12 cm right iliac limb contralateral     PROCEDURE DETAIL: After obtaining informed consent the patient was taken to the operating room. The patient was placed in supine position the operating room table. After induction of general anesthesia and endotracheal intubation a Foley catheter was placed. Next the patient was prepped and draped in usual sterile fashion from the nipples down to the knees. Ultrasound was used to identify the right common femoral artery as well as the femoral bifurcation. An 11 blade was used to make a small neck in the skin over the level of the right common femoral artery. An introducer needle was then used to cannulate the right common femoral artery without difficulty. A 0.035 Bentson wire was then threaded up into the abdominal aorta through the right femoral system. A short 9 French dilator was placed over the guidewire the right femoral system. This was used to dilate the tract. The dilator was then removed and a Proglide device inserted over the guidewire into the right femoral system and this was deployed at the 2:00 position. The Proglide device was then removed and an additional Proglide device was brought in operative field and deployed at the 10:00 position. The sutures were kept separate and tagged with suture tags. Next the short 9 French sheath was then placed back over the guidewire into the right femoral system and the dilator removed and the sheath thoroughly flushed with heparinized saline. Attention was then turned to the left groin. Again using ultrasound the left common femoral artery was identified. The femoral bifurcation was also identified. A small nick was made in the skin with the  11 blade. A hemostat was used to dilate a tract down to the artery. An introducer needle was then used to cannulate the left common femoral artery without difficulty. A 0.035 Bentson wire was then threaded up into the abdominal aorta under fluoroscopic guidance. A 9 French dilator was then placed over the wire to dilate the tract. Two Proglide devices were again brought on operative field and these were fired sequentially in the 10:00 position followed by an additional Proglide at the 2:00 position. The Proglide delivery systems were removed and the long 9 French sheath placed back over the guidewire up to the level of the iliac of the aortic bifurcation.  At this point, a 5 [place] Omni flush catheter was placed over the guidewire and the right groin up into the abdominal aorta. An abdominal aortogram was obtained in the AP position to determine level of the left and right renal arteries. The sheath was exchanged for a 16 Fr dryseal over the wire.  This was flushed with heparinized saline.  At this point a 23 x 14 x 16 Gore Excluder main body device was selected. The Bentson wire from the left groin was advanced up into the descending thoracic aorta over a Kumpe catheter and the Bentsen wire replaced with an 035 Amplatz wire. The main body device was then placed over the Amplatz wire in the left groin and advanced up to the level of the renal arteries. Magnified views of the renal arteries were performed to make sure that we were not covering these. The top portion of the  stent graft was then deployed with the end of the stent just below the level of the left renal artery and this came over to just below the level of the right renal artery. The main body was delivered all the way down to the contralateral gate. Attention was then turned to the right groin. The Omni flush catheter was pulled down over a guidewire and removed and the Bentson wire placed in position to cannulate the contralateral gate. This was used  to selectively catheterize the contralateral gate and the guidewire was then advanced into the descending thoracic aorta. The main body portion of the gate was confirmed by twirling the pigtail catheter. The pigtail  catheter was then placed in a location so that we could use its markers to determine the exact length to the iliac bifurcation. An Amplatz wire was placed back through the pigtail catheter. A retrograde contrast angiogram was performed to determine the level of the right internal iliac artery and a 14 x 12 cm iliac limb was selected. The pigtail catheter was removed over the guidewire and a 12 Fr dryseal placed over the wire followed by a 14 x 12 cm limb advanced so there was full coverage of the long marker on the contralateral limb. This was then deployed in the usual fashion down to the iliac bifurcation. The delivery system was then removed. The remainder of the ipsilateral iliac limb was also deployed.  A retrograde contrast angiogram was also performed to make sure that the leftt iliac limb did not cover the leftt internal iliac artery.   Attention was then turned back to the left iliac system and a Gore aortic balloon placed over the wire up to the level of the proximal end of the stent and this was ballooned to profile. The limb attachment site was also ballooned as well as the distal attachment site. Attention was then turned to the right groin and the balloon was advanced over the guidewire on the right side and the distal attachment site as well as the midportion of iliac limb were also ballooned. The 5 [place] Omni flush catheter was then placed back over the guidewire on the right side and a completion arteriogram was obtained. This showed no evidence of proximal or distal type I endoleak. There was no type II endoleak. There was no filling of the aneurysm sac.  There were patent internal external iliac and renal arteries bilaterally with no endoleak.    At this point the Omni flush  catheter was removed over a guidewire. All delivery devices were removed. We then proceeded to remove the large 16 French sheath from the right side with the guidewire in place. With pressure held above and below the insertion site the lateral Proglide closure was secured down.  An additional lateral Proglide was then placed over the guidewire and deployed.  There was good hemostasis. The guidewire was removed from the right side. Attention was then turned to the left groin. In similar fashion the 12 French sheath was removed and the guidewire left in place.  There was good hemostasis and the Bentsen wire was removed from the left groin. The patient had been given 5000 units of heparin before introducing the main body device. This was fully reversed with 50 mg of protamine at the end of the case. Each groin puncture site was then closed with a running 4-0 Vicryl subcuticular stitch.  The patient tolerated procedure well and there were no complications. Instrument sponge and needle counts correct in  the case. Patient was awakened in the operating room extubated and taken to the recovery room in stable condition.  [name], MD [facility] Office: [phone] Pager: [phone]

## 2014-05-25 NOTE — Transfer of Care (Signed)
Immediate Anesthesia Transfer of Care Note  Patient: [name]  Procedure(s) Performed: Procedure(s): ABDOMINAL AORTIC ENDOVASCULAR STENT GRAFT (N/A)  Patient Location: PACU  Anesthesia Type:General  Level of Consciousness: awake, alert  and oriented  Airway & Oxygen Therapy: Patient Spontanous Breathing and Patient connected to nasal cannula oxygen  Post-op Assessment: Report given to PACU RN and Post -op Vital signs reviewed and stable  Post vital signs: Reviewed and stable  Complications: No apparent anesthesia complications   VSS. SPO2 100%. Oral temp 97.5

## 2014-05-25 NOTE — H&P (Signed)
[facility] HISTORY AND PHYSICAL   History of Present Illness: Patient is a 80 y.o. year old male who presents for evaluation of AAA. Pt with known history of AAA was 4.9 cm diameter 12/14 today on CT 5.5 cm. Pt complained of abdominal pain earlier today and currently points to lower abdomen and over left lower quadrant hernia. No back pain. No syncope. No hypotension. Pt Alert and oriented good quality of life lives in assisted living plays piano 3 days per week interacts with many friends. He gets around on a walker. Other medical problems include hypertension, osteoporosis, polymyalgia rheumatica history of cholelithiasis. All are stable. Was also found to have closed loop obstruction on CT in his hernia.   Past Medical History  Diagnosis Date  . Hypertension   . Polymyalgia rheumatica   . Diverticulosis   . Thyroid disease   . Diarrhea   . Hypokalemia   . Colon cancer 2001  . Skin cancer   . AAA (abdominal aortic aneurysm)     4.6 X 4.9 cm  . Osteoporosis   . BPH (benign prostatic hyperplasia)   . Tinea unguium   . Temporal arteritis   . Obstructive jaundice     2013  . Pancreatitis     2013, 04/2013  . Inguinal hernia, left     Past Surgical History  Procedure Laterality Date  . Colon surgery  2001    right colectomy for colon cancer  . Eus  07/13/2011    Dr. Carol Ada, inadequate conscious sedation. limited views of pancreas. No evidence of choledocholithiasis, CBD 8mm.  . Cholecystectomy    . Ercp N/A 05/21/2013    Procedure: ENDOSCOPIC RETROGRADE CHOLANGIOPANCREATOGRAPHY (ERCP) Sphincterotomy and stone extraction; Surgeon: Rogene Houston, MD; Location: [facility]; Service: Endoscopy; Laterality: N/A;    Social History History  Substance Use Topics  . Smoking status: Never Smoker   . Smokeless tobacco: Not on file  .  Alcohol Use: No    Family History No family history on file.  Allergies  Allergies  Allergen Reactions  . Darvocet [Propoxyphene N-Acetaminophen]     Propoxyphene (Per MAR)  . Fish Oil Other (See Comments)    unk  . Lactose Intolerance (Gi) Other (See Comments)    unk   . Loratadine [Loratadine] Other (See Comments)    dizziness (PER MAR)  . Penicillins Swelling    Swelling of hands (per Echart records)   No current facility-administered medications on file prior to encounter.   Current Outpatient Prescriptions on File Prior to Encounter  Medication Sig Dispense Refill  . Budesonide (PULMICORT FLEXHALER) 90 MCG/ACT inhaler Inhale 1 puff into the lungs 2 (two) times daily.    . cholecalciferol (VITAMIN D) 400 UNITS TABS Take 400 Units by mouth 2 (two) times daily.    . cyanocobalamin (V-R VITAMIN B-12) 500 MCG tablet Take 1 tablet (500 mcg total) by mouth daily. 30 tablet 11  . donepezil (ARICEPT) 5 MG tablet Take 1 tablet (5 mg total) by mouth at bedtime. 30 tablet 11  . feeding supplement (ENSURE IMMUNE HEALTH) LIQD Take 237 mLs by mouth 2 (two) times daily.    . ferrous sulfate 325 (65 FE) MG tablet Take 325 mg by mouth daily with breakfast.    . furosemide (LASIX) 20 MG tablet Take 20 mg by mouth every other day.     . levothyroxine (SYNTHROID, LEVOTHROID) 50 MCG tablet Take 50 mcg by mouth daily.    Marland Kitchen  lisinopril (PRINIVIL,ZESTRIL) 10 MG tablet Take 10 mg by mouth daily.    . predniSONE (DELTASONE) 5 MG tablet Take 5 mg by mouth 2 (two) times daily.      ROS:   General: + weight loss,no Fever, no chills  HEENT: No recent headaches, no nasal bleeding, no visual changes, no sore throat  Neurologic: No dizziness, blackouts, seizures. No recent symptoms of stroke or mini- stroke. No recent episodes of slurred speech, or temporary blindness.  Cardiac: No recent episodes of chest pain/pressure, no shortness of breath at rest. +  shortness of breath with exertion. Denies history of atrial fibrillation or irregular heartbeat  Vascular: No history of rest pain in feet. No history of claudication. No history of non-healing ulcer, No history of DVT   Pulmonary: No home oxygen, no productive cough, no hemoptysis, No asthma or wheezing  Musculoskeletal: [x ] Arthritis, [ ]  Low back pain, [x ] Joint pain  Hematologic:No history of hypercoagulable state. No history of easy bleeding. No history of anemia  Gastrointestinal: No hematochezia or melena, No gastroesophageal reflux, no trouble swallowing  Urinary: [ ]  chronic Kidney disease, [ ]  on HD - [ ]  MWF or [ ]  TTHS, [ ]  Burning with urination, [ ]  Frequent urination, [ ]  Difficulty urinating;   Skin: No rashes  Psychological: No history of anxiety, No history of depression   Physical Examination   Filed Vitals:   05/25/14 0600  BP: 137/73  Pulse: 58  Temp: 97.6 F (36.4 C)  TempSrc: Oral  Resp: 18  SpO2: 100%    General: Alert and oriented, no acute distress HEENT: Normal Neck: No JVD Pulmonary: Clear to auscultation bilaterally Cardiac: Regular Rate and Rhythm  Abdomen: slightly distended, large incarcerated hernia left lower quadrant mildly tender, vaguely palpable aortic pulsation in epigastrium Skin: No rash, multiple areas of ecchymosis on arms legs Extremity Pulses: 2+ radial, brachial, femoral, 2+ right dorsalis pedis,absent left DP absent bilat posterior tibial pulses bilaterally Musculoskeletal: No deformity or edema Neurologic: Upper and lower extremity motor 5/5 and symmetric  DATA:    CTabdomen images reviewed. Pt has adequate 19-20 mm neck for stent graft repair with adequate access from iliacs. Infrarenal AAA 5.5 cm no evidence of rupture, large left ing hernia with dilated bowel, mild left renal stenosis, no significant mesenteric stenosis    ASSESSMENT: 5.5 cm AAA non ruptured at size to consider repair but  hernia is most likely cause of his pain currently   PLAN: Discussed with pt and his daughter Miachel Roux who is his POA. Discussed with them elective stent graft repair of AAA. Risk of perioperative cardiac or pulmonary issues 5%. They wish to consider elective repair. We will defer this until his hernia has been addressed. Current risk of rupture approx 5% per year. Will follow as consult.  [name], MD [facility] Office: [phone] Pager: [phone]   The patient has been re-examined.  The patient's history and physical has been reviewed and is unchanged.  There is no change in the plan of care.  [name], MD

## 2014-05-25 NOTE — Anesthesia Preprocedure Evaluation (Addendum)
Anesthesia Evaluation  Patient identified by MRN, date of birth, ID band Patient awake    Reviewed: Allergy & Precautions, H&P , NPO status , Patient's Chart, lab work & pertinent test results  History of Anesthesia Complications Negative for: history of anesthetic complications  Airway Mallampati: II  TM Distance: >3 FB Neck ROM: Full    Dental  (+) Dental Advisory Given, Missing, Chipped   Pulmonary neg pulmonary ROS,  breath sounds clear to auscultation        Cardiovascular hypertension, Pt. on medications + Peripheral Vascular Disease + Valvular Problems/Murmurs AS Rhythm:Regular + Systolic murmurs ECHO 26/83 >> EF 55-60% EKG 04/12/14 >> SB with PAC   Neuro/Psych negative neurological ROS  negative psych ROS   GI/Hepatic negative GI ROS, Neg liver ROS,   Endo/Other  Hypothyroidism   Renal/GU Renal InsufficiencyRenal disease     Musculoskeletal   Abdominal   Peds  Hematology negative hematology ROS (+)   Anesthesia Other Findings   Reproductive/Obstetrics                          Anesthesia Physical Anesthesia Plan  ASA: III  Anesthesia Plan: General   Post-op Pain Management:    Induction: Intravenous  Airway Management Planned: Oral ETT  Additional Equipment: Arterial line  Intra-op Plan:   Post-operative Plan: Extubation in OR  Informed Consent: I have reviewed the patients History and Physical, chart, labs and discussed the procedure including the risks, benefits and alternatives for the proposed anesthesia with the patient or authorized representative who has indicated his/her understanding and acceptance.   Dental advisory given  Plan Discussed with: CRNA, Anesthesiologist and Surgeon  Anesthesia Plan Comments:        Anesthesia Quick Evaluation

## 2014-05-25 NOTE — Progress Notes (Signed)
Nurse called [facility] [phone] and spoke with Aniceto Boss who is a 3rd shift Nurse that is patients caregiver. Aniceto Boss stated that patient took medications this morning at 0430, and that he was NPO after midnight. However Aniceto Boss was unsure as to when patient last had any solid food. Med Rec updated in EPIC.

## 2014-05-25 NOTE — Progress Notes (Signed)
  Day of Surgery Note    Subjective:  Pt without complaints this afternoon.  Family states pt is doing well.    Filed Vitals:   05/25/14 1412  BP: 142/58  Pulse:   Temp:   Resp:    BMET    Component Value Date/Time   NA 137 05/25/2014 1030   NA 143 09/23/2013 1419   K 5.0 05/25/2014 1030   CL 109 05/25/2014 1030   CO2 20 05/25/2014 1030   GLUCOSE 101* 05/25/2014 1030   GLUCOSE 120* 09/23/2013 1419   BUN 20 05/25/2014 1030   BUN 16 09/23/2013 1419   CREATININE 0.95 05/25/2014 1030   CALCIUM 8.4 05/25/2014 1030   GFRNONAA 69* 05/25/2014 1030   GFRAA 80* 05/25/2014 1030   CBC Latest Ref Rng 05/25/2014 05/17/2014 04/14/2014  WBC 4.0 - 10.5 K/uL 6.6 6.1 6.6  Hemoglobin 13.0 - 17.0 g/dL 11.1(L) 13.3 11.0(L)  Hematocrit 39.0 - 52.0 % 34.9(L) 41.4 33.4(L)  Platelets 150 - 400 K/uL 126(L) 153 113(L)      Incisions:   Bilateral groins are soft Extremities:  Doppler signals present in right DP/PT & left PT/AT Cardiac:  regular Lungs:  Non labored Abdomen:  Soft, NT/ND  Assessment/Plan:  This is a 80 y.o. male who is s/p Gore Excluder Stent Graft repair of AAA   -pt doing well this afternoon. -will not take pills with ice water-it is okay to take with sweet tea in small amounts. -UTI on pre op U/A-will start Cipro for 10 days. -if doing well, back to assisted living tomorrow    Leontine Locket, PA-C 05/25/2014 2:30 PM

## 2014-05-25 NOTE — Progress Notes (Signed)
Patient with CrCl ~ 33.8 ml/min and Wt 50.2 kg.  He received vancomycin 1g this AM at 0730, and is scheduled for 2 more doses.  I have adjusted dose to just one additional dose of 1 g tomorrow AM due to his renal function.  Thanks!  Uvaldo Rising, BCPS  Clinical Pharmacist Pager [phone]  05/25/2014 11:59 AM

## 2014-05-26 ENCOUNTER — Encounter (HOSPITAL_COMMUNITY): Payer: Self-pay | Admitting: Vascular Surgery

## 2014-05-26 LAB — CBC
HEMATOCRIT: 34.1 % — AB (ref 39.0–52.0)
HEMOGLOBIN: 11 g/dL — AB (ref 13.0–17.0)
MCH: 29.3 pg (ref 26.0–34.0)
MCHC: 32.3 g/dL (ref 30.0–36.0)
MCV: 90.7 fL (ref 78.0–100.0)
Platelets: 117 10*3/uL — ABNORMAL LOW (ref 150–400)
RBC: 3.76 MIL/uL — AB (ref 4.22–5.81)
RDW: 14.4 % (ref 11.5–15.5)
WBC: 6.6 10*3/uL (ref 4.0–10.5)

## 2014-05-26 LAB — BASIC METABOLIC PANEL
Anion gap: 7 (ref 5–15)
BUN: 18 mg/dL (ref 6–23)
CO2: 24 mmol/L (ref 19–32)
Calcium: 8.4 mg/dL (ref 8.4–10.5)
Chloride: 106 mEq/L (ref 96–112)
Creatinine, Ser: 1.08 mg/dL (ref 0.50–1.35)
GFR calc Af Amer: 66 mL/min — ABNORMAL LOW (ref >90)
GFR, EST NON AFRICAN AMERICAN: 57 mL/min — AB (ref >90)
GLUCOSE: 100 mg/dL — AB (ref 70–99)
Potassium: 4 mmol/L (ref 3.5–5.1)
Sodium: 137 mmol/L (ref 135–145)

## 2014-05-26 NOTE — Discharge Summary (Signed)
AAA Discharge Summary    KYVON HU Aug 13, 1917 80 y.o. male  [id number]  Admission Date: 05/25/2014  Discharge Date: 05/26/2014  Physician: Elam Dutch, MD  Admission Diagnosis: Abdominal Aortic Aneurysm I71.4   HPI:   This is a 80 y.o. male who presents for evaluation of AAA. Pt with known history of AAA was 4.9 cm diameter 12/14 today on CT 5.5 cm. Pt complained of abdominal pain earlier today and currently points to lower abdomen and over left lower quadrant hernia. No back pain. No syncope. No hypotension. Pt Alert and oriented good quality of life lives in assisted living plays piano 3 days per week interacts with many friends. He gets around on a walker. Other medical problems include hypertension, osteoporosis, polymyalgia rheumatica history of cholelithiasis. All are stable. Was also found to have closed loop obstruction on CT in his hernia.  Hospital Course:  The patient was admitted to the hospital and taken to the operating room on 05/25/2014 and underwent: Gore Excluder Stent Graft repair of AAA    The pt tolerated the procedure well and was transported to the PACU in good condition.   By POD 1, he was doing well.  He did have an IV infiltrate on his right arm.  He was able to void prior to discharge.  He is placed on Cipro for pre-operative UTI.  The remainder of the hospital course consisted of increasing mobilization and increasing intake of solids without difficulty. Discharge to SNF in stable condition  CBC    Component Value Date/Time   WBC 6.6 05/26/2014 0400   WBC 6.1 09/23/2013 1739   WBC 8.1 03/05/2013 1601   RBC 3.76* 05/26/2014 0400   RBC 4.78 09/23/2013 1739   RBC 5.2 03/05/2013 1601   HGB 11.0* 05/26/2014 0400   HGB 15.6 03/05/2013 1601   HCT 34.1* 05/26/2014 0400   HCT 47.0 03/05/2013 1601   PLT 117* 05/26/2014 0400   MCV 90.7 05/26/2014 0400   MCV 90.1 03/05/2013 1601   MCH 29.3 05/26/2014 0400   MCH 30.5  09/23/2013 1739   MCH 29.9 03/05/2013 1601   MCHC 32.3 05/26/2014 0400   MCHC 33.0 09/23/2013 1739   MCHC 33.2 03/05/2013 1601   RDW 14.4 05/26/2014 0400   RDW 13.7 09/23/2013 1739   LYMPHSABS 0.7 04/11/2014 0843   LYMPHSABS 0.5* 09/23/2013 1739   MONOABS 0.7 04/11/2014 0843   EOSABS 0.0 04/11/2014 0843   EOSABS 0.0 09/23/2013 1739   BASOSABS 0.0 04/11/2014 0843   BASOSABS 0.0 09/23/2013 1739    BMET    Component Value Date/Time   NA 137 05/26/2014 0400   NA 143 09/23/2013 1419   K 4.0 05/26/2014 0400   CL 106 05/26/2014 0400   CO2 24 05/26/2014 0400   GLUCOSE 100* 05/26/2014 0400   GLUCOSE 120* 09/23/2013 1419   BUN 18 05/26/2014 0400   BUN 16 09/23/2013 1419   CREATININE 1.08 05/26/2014 0400   CALCIUM 8.4 05/26/2014 0400   GFRNONAA 57* 05/26/2014 0400   GFRAA 66* 05/26/2014 0400     Discharge Instructions:   The patient is discharged with extensive instructions on wound care and progressive ambulation.  They are instructed not to drive or perform any heavy lifting until returning to see the physician in his office.  Discharge Instructions    ABDOMINAL PROCEDURE/ANEURYSM REPAIR/AORTO-BIFEMORAL BYPASS:  Call MD for increased abdominal pain; cramping diarrhea; nausea/vomiting    Complete by:  As directed      Call MD  for:  redness, tenderness, or signs of infection (pain, swelling, bleeding, redness, odor or green/yellow discharge around incision site)    Complete by:  As directed      Call MD for:  severe or increased pain, loss or decreased feeling  in affected limb(s)    Complete by:  As directed      Call MD for:  temperature >100.5    Complete by:  As directed      Driving Restrictions    Complete by:  As directed   No driving for 2 weeks     Lifting restrictions    Complete by:  As directed   No lifting for 4 weeks     Resume previous diet    Complete by:  As directed      may wash over wound with mild soap and water    Complete by:  As directed   Shower  daily with soap and water starting 05/27/14           Discharge Diagnosis:  Abdominal Aortic Aneurysm I71.4  Secondary Diagnosis: Patient Active Problem List   Diagnosis Date Noted  . Preoperative clearance 04/21/2014  . CKD (chronic kidney disease), stage III 04/12/2014  . Lactic acidosis 04/11/2014  . Inguinal hernia with obstruction 04/11/2014  . Pancreatitis 05/19/2013  . Transaminitis 05/19/2013  . Hypokalemia 05/19/2013  . Diarrhea 05/19/2013  . Hypothyroidism 07/15/2011  . AAA (abdominal aortic aneurysm) 07/15/2011  . Obstructive jaundice 07/12/2011  . HTN (hypertension) 07/12/2011  . Polymyalgia rheumatica 07/12/2011  . H/O colon cancer, stage I 07/12/2011   Past Medical History  Diagnosis Date  . Hypertension   . Polymyalgia rheumatica   . Diverticulosis   . Thyroid disease   . Diarrhea   . Hypokalemia   . Colon cancer 2001  . Skin cancer   . AAA (abdominal aortic aneurysm)     4.6 X 4.9 cm  . Osteoporosis   . BPH (benign prostatic hyperplasia)   . Tinea unguium   . Temporal arteritis   . Obstructive jaundice     2013  . Pancreatitis     2013, 04/2013  . Inguinal hernia, left   . Hypothyroidism        Medication List    TAKE these medications        acetaminophen 500 MG tablet  Commonly known as:  TYLENOL  Take 500 mg by mouth every 4 (four) hours as needed for mild pain.     ALPRAZolam 0.25 MG tablet  Commonly known as:  XANAX  Take 1 tablet (0.25 mg total) by mouth 2 (two) times daily as needed for anxiety.     anti-nausea solution  Take 5 mLs by mouth every 15 (fifteen) minutes as needed for nausea or vomiting.     cholecalciferol 400 UNITS Tabs tablet  Commonly known as:  VITAMIN D  Take 400 Units by mouth 2 (two) times daily.     ciprofloxacin 500 MG tablet  Commonly known as:  CIPRO  Take 1 tablet (500 mg total) by mouth 2 (two) times daily.     cyanocobalamin 500 MCG tablet  Commonly known as:  V-R VITAMIN B-12  Take 1  tablet (500 mcg total) by mouth daily.     donepezil 5 MG tablet  Commonly known as:  ARICEPT  Take 1 tablet (5 mg total) by mouth at bedtime.     feeding supplement Liqd  Take 237 mLs by mouth 2 (two) times daily.  ferrous sulfate 325 (65 FE) MG tablet  Take 325 mg by mouth daily with breakfast.     furosemide 20 MG tablet  Commonly known as:  LASIX  Take 20 mg by mouth every other day.     levothyroxine 50 MCG tablet  Commonly known as:  SYNTHROID, LEVOTHROID  Take 50 mcg by mouth daily.     lisinopril 10 MG tablet  Commonly known as:  PRINIVIL,ZESTRIL  Take 10 mg by mouth daily.     loperamide 2 MG capsule  Commonly known as:  IMODIUM  Take 2 mg by mouth as needed for diarrhea or loose stools.     miconazole 2 % cream  Commonly known as:  MICOTIN  Apply 1 application topically 2 (two) times daily.     predniSONE 5 MG tablet  Commonly known as:  DELTASONE  Take 5 mg by mouth 2 (two) times daily.     promethazine 12.5 MG tablet  Commonly known as:  PHENERGAN  Take 12.5 mg by mouth every 8 (eight) hours as needed for nausea or vomiting.     PULMICORT FLEXHALER 90 MCG/ACT inhaler  Generic drug:  Budesonide  Inhale 1 puff into the lungs 2 (two) times daily.        No pain medication Rx given Cipro 500 mg bid #18 given NR  Disposition: assisted living  Patient's condition: is Good  Follow up: 1. Dr. Oneida Alar in 4 weeks   Leontine Locket, PA-C [facility] [phone] 05/26/2014  7:42 AM   - For VQI Registry use ---   Post-op:  Time to Extubation: [x]  In OR, [ ]  < 12 hrs, [ ]  12-24 hrs, [ ]  >=24 hrs Vasopressors Req. Post-op: No ICU Stay: 1 day in stepdown Transfusion: No  If yes, n/a units given MI: No, [ ]  Troponin only, [ ]  EKG or Clinical New Arrhythmia: No  Complications: CHF: No Resp failure: No, [ ]  Pneumonia, [ ]  Ventilator Chg in renal function: No, [ ]  Inc. Cr > 0.5, [ ]  Temp. Dialysis, [ ]  Permanent dialysis Leg  ischemia: No, no Surgery needed, [ ]  Yes, Surgery needed, [ ]  Amputation Bowel ischemia: No, [ ]  Medical Rx, [ ]  Surgical Rx Wound complication: No, [ ]  Superficial separation/infection, [ ]  Return to OR Return to OR: No  Return to OR for bleeding: No Stroke: No, [ ]  Minor, [ ]  Major  Discharge medications: Statin use:  No If No: [ ]  For Medical reasons, [ ]  Non-compliant ASA use:  No  If No: [ ]  For Medical reasons, [ ]  Non-compliant Plavix use:  No If No: [ ]  For Medical reasons, [ ]  Non-compliant Beta blocker use:  No If No: [ ]  For Medical reasons, [ ]  Non-compliant

## 2014-05-26 NOTE — Clinical Social Work Psychosocial (Signed)
Clinical Social Work Department BRIEF PSYCHOSOCIAL ASSESSMENT 05/26/2014  Patient:  Corey Walker, Corey Walker     Account Number:  [id number]     Admit date:  05/25/2014  Clinical Social Worker:  Marciano Sequin  Date/Time:  05/26/2014 03:14 PM  Referred by:  RN  Date Referred:  05/26/2014 Referred for  ALF Placement   Other Referral:   Interview type:  Patient Other interview type:    PSYCHOSOCIAL DATA Living Status:   Admitted from facility:   Level of care:   Primary support name:   Primary support relationship to patient:   Degree of support available:    CURRENT CONCERNS Current Concerns  None Noted   Other Concerns:    SOCIAL WORK ASSESSMENT / PLAN CSW met pt and family at the bedside. CSW introduced self and purpose of the meeting. The pt reported being a resident at [facility]. Pt reported that the is ready to return back to his ALF. The pt's family reported they will transport the pt back to his ALF. CSW completed the discharge packet.  CSW provided the bedside RN the number to call report.   Assessment/plan status:  Psychosocial Support/Ongoing Assessment of Needs Other assessment/ plan:   Information/referral to community resources:    PATIENT'S/FAMILY'S RESPONSE TO PLAN OF CARE: Pt presented with a happy affect and joyful mood. Pt oriented 4x. Pt reported being ready to return to his ALF.       [place], MSW, [place]

## 2014-05-26 NOTE — Progress Notes (Addendum)
  Progress Note    05/26/2014 7:35 AM 1 Day Post-Op  Subjective:  Wants to know when he's going to be discharged  Afebrile HR 50's NSR with one episode at 6pm at 100bpm 194'R-740'C systolic 144% RA  Filed Vitals:   05/26/14 0334  BP:   Pulse:   Temp: 98.1 F (36.7 C)  Resp:     Physical Exam: Cardiac:  regualr Lungs:  Non labored Incisions:  Bilateral groins are soft without hematoma Extremities:  Audible doppler signals bilateral PT/DP; right arm with redness from elbow to mid arm from IV infiltration Abdomen:  Soft, NT/ND  CBC    Component Value Date/Time   WBC 6.6 05/26/2014 0400   WBC 6.1 09/23/2013 1739   WBC 8.1 03/05/2013 1601   RBC 3.76* 05/26/2014 0400   RBC 4.78 09/23/2013 1739   RBC 5.2 03/05/2013 1601   HGB 11.0* 05/26/2014 0400   HGB 15.6 03/05/2013 1601   HCT 34.1* 05/26/2014 0400   HCT 47.0 03/05/2013 1601   PLT 117* 05/26/2014 0400   MCV 90.7 05/26/2014 0400   MCV 90.1 03/05/2013 1601   MCH 29.3 05/26/2014 0400   MCH 30.5 09/23/2013 1739   MCH 29.9 03/05/2013 1601   MCHC 32.3 05/26/2014 0400   MCHC 33.0 09/23/2013 1739   MCHC 33.2 03/05/2013 1601   RDW 14.4 05/26/2014 0400   RDW 13.7 09/23/2013 1739   LYMPHSABS 0.7 04/11/2014 0843   LYMPHSABS 0.5* 09/23/2013 1739   MONOABS 0.7 04/11/2014 0843   EOSABS 0.0 04/11/2014 0843   EOSABS 0.0 09/23/2013 1739   BASOSABS 0.0 04/11/2014 0843   BASOSABS 0.0 09/23/2013 1739    BMET    Component Value Date/Time   NA 137 05/26/2014 0400   NA 143 09/23/2013 1419   K 4.0 05/26/2014 0400   CL 106 05/26/2014 0400   CO2 24 05/26/2014 0400   GLUCOSE 100* 05/26/2014 0400   GLUCOSE 120* 09/23/2013 1419   BUN 18 05/26/2014 0400   BUN 16 09/23/2013 1419   CREATININE 1.08 05/26/2014 0400   CALCIUM 8.4 05/26/2014 0400   GFRNONAA 57* 05/26/2014 0400   GFRAA 66* 05/26/2014 0400    INR    Component Value Date/Time   INR 0.98 05/17/2014 1429     Intake/Output Summary (Last 24 hours) at 05/26/14  0735 Last data filed at 05/26/14 0654  Gross per 24 hour  Intake 2858.33 ml  Output   1535 ml  Net 1323.33 ml     Assessment:  80 y.o. male is s/p:  Gore Excluder Stent Graft repair of AAA  1 Day Post-Op  Plan: -pt with audible doppler signals in PT/DP bilaterally -DVT prophylaxis:  Lovenox to start later today if not discharged -right arm with IV infiltrate yesterday-warm compresses to right arm -BUN/Cr normal this am -Cipro started yesterday for pre-operative UTI -RN reports pt did very well getting up to chair -foley out this am-pt needs to void -needs to mobilize this am -hopefully, he will void and discharge later this morning   Leontine Locket, PA-C [facility] [phone] 05/26/2014 7:35 AM    Agree with above.  D/c home today if no urinary retention  [name], MD [facility] [place]: [phone] Pager: [phone]

## 2014-05-26 NOTE — Progress Notes (Signed)
Pt d/cing to ALF via transportation of family, pt VSS, pt verbalized understanding of d/c, family at [facility], report called to [place], d/c instruction reviewed and updated by this RN, all questions answered

## 2014-05-26 NOTE — Anesthesia Postprocedure Evaluation (Signed)
  Anesthesia Post-op Note  Patient: Corey Walker  Procedure(s) Performed: Procedure(s): ABDOMINAL AORTIC ENDOVASCULAR STENT GRAFT (N/A)  Patient Location: PACU  Anesthesia Type:General  Level of Consciousness: awake  Airway and Oxygen Therapy: Patient Spontanous Breathing  Post-op Pain: mild  Post-op Assessment: Post-op Vital signs reviewed, Patient's Cardiovascular Status Stable, Respiratory Function Stable, Patent Airway, No signs of Nausea or vomiting and Pain level controlled  Post-op Vital Signs: Reviewed and stable  Last Vitals:  Filed Vitals:   05/26/14 0927  BP: 122/54  Pulse:   Temp:   Resp:     Complications: No apparent anesthesia complications

## 2014-05-27 DIAGNOSIS — K4030 Unilateral inguinal hernia, with obstruction, without gangrene, not specified as recurrent: Secondary | ICD-10-CM

## 2014-05-27 DIAGNOSIS — N189 Chronic kidney disease, unspecified: Secondary | ICD-10-CM

## 2014-05-27 DIAGNOSIS — I129 Hypertensive chronic kidney disease with stage 1 through stage 4 chronic kidney disease, or unspecified chronic kidney disease: Secondary | ICD-10-CM

## 2014-06-03 ENCOUNTER — Telehealth: Payer: Self-pay | Admitting: Vascular Surgery

## 2014-06-03 NOTE — Telephone Encounter (Addendum)
-----   Message from Mena Goes, RN sent at 05/25/2014 12:32 PM EST ----- Regarding: Schedule   ----- Message -----    From: Gabriel Earing, PA-C    Sent: 05/25/2014   9:42 AM      To: [facility] Charge Pool  S/p EVAR 05/25/14.  F/u with Dr. Oneida Alar in 4 weeks with CTA protocol.  Thanks, Samantha   06/03/14: left msg for pt to call for appt info- sent to [facility] for prior auth

## 2014-06-23 ENCOUNTER — Encounter: Payer: Self-pay | Admitting: Vascular Surgery

## 2014-06-24 ENCOUNTER — Ambulatory Visit
Admission: RE | Admit: 2014-06-24 | Discharge: 2014-06-24 | Disposition: A | Discharge status: Home / Self Care | Payer: Medicare Other | Source: Ambulatory Visit | Attending: Vascular Surgery | Admitting: Vascular Surgery

## 2014-06-24 ENCOUNTER — Ambulatory Visit (INDEPENDENT_AMBULATORY_CARE_PROVIDER_SITE_OTHER): Payer: Self-pay | Admitting: Vascular Surgery

## 2014-06-24 ENCOUNTER — Encounter: Payer: Self-pay | Admitting: Vascular Surgery

## 2014-06-24 VITALS — BP 136/55 | HR 56 | Ht 67.0 in | Wt 111.0 lb

## 2014-06-24 DIAGNOSIS — Z95828 Presence of other vascular implants and grafts: Secondary | ICD-10-CM

## 2014-06-24 DIAGNOSIS — I714 Abdominal aortic aneurysm, without rupture, unspecified: Secondary | ICD-10-CM

## 2014-06-24 MED ORDER — IOHEXOL 350 MG/ML SOLN
75.0000 mL | Freq: Once | INTRAVENOUS | Status: AC | PRN
  Administered 2014-06-24: 75 mL via INTRAVENOUS

## 2014-06-24 NOTE — Addendum Note (Signed)
Addended by: Mena Goes on: 06/24/2014 04:28 PM   Modules accepted: Orders

## 2014-06-24 NOTE — Progress Notes (Signed)
[facility] [place] HISTORY AND PHYSICAL    History of Present Illness:  Patient is a 81 y.o.year old male who presents for follow-up evaluation of AAA. He underwent Gore Excluder aneurysm stent graft repair 05/25/2014. The patient denies new abdominal or back pain. He does still have some intermittent constipation and diarrhea from a long-standing chronic incarcerated left inguinal hernia.  Other medical problems include hypertension, polymyalgia rheumatica, osteoporosis, benign prostatic hypertrophy which are all currently stable and followed by his primary care physician.   Past Medical History  Diagnosis Date  . Hypertension   . Polymyalgia rheumatica   . Diverticulosis   . Thyroid disease   . Diarrhea   . Hypokalemia   . Colon cancer 2001  . Skin cancer   . AAA (abdominal aortic aneurysm)     4.6 X 4.9 cm  . Osteoporosis   . BPH (benign prostatic hyperplasia)   . Tinea unguium   . Temporal arteritis   . Obstructive jaundice     2013  . Pancreatitis     2013, 04/2013  . Inguinal hernia, left   . Hypothyroidism      Past Surgical History  Procedure Laterality Date  . Colon surgery  2001    right colectomy for colon cancer  . Eus  07/13/2011    Dr. Carol Ada, inadequate conscious sedation. limited views of pancreas. No evidence of choledocholithiasis, CBD 84mm.  . Cholecystectomy    . Ercp N/A 05/21/2013    Procedure: ENDOSCOPIC RETROGRADE CHOLANGIOPANCREATOGRAPHY (ERCP) Sphincterotomy and stone extraction;  Surgeon: Rogene Houston, MD;  Location: [facility];  Service: Endoscopy;  Laterality: N/A;  . Eye surgery      cataracts removed from both eyes   . Abdominal aortic endovascular stent graft N/A 05/25/2014    Procedure: ABDOMINAL AORTIC ENDOVASCULAR STENT GRAFT;  Surgeon: Elam Dutch, MD;  Location: [facility] OR;  Service: Vascular;  Laterality: N/A;       Review of Systems:  Neurologic: denies symptoms of TIA, amaurosis, or stroke Cardiac:denies  shortness of breath or chest pain Pulmonary: denies cough or wheeze Abdomen: denies abdominal pain nausea or vomiting  History   Social History  . Marital Status: Widowed    Spouse Name: N/A    Number of Children: N/A  . Years of Education: N/A   Occupational History  . Not on file.   Social History Main Topics  . Smoking status: Never Smoker   . Smokeless tobacco: Not on file  . Alcohol Use: No  . Drug Use: No  . Sexual Activity: Not on file   Other Topics Concern  . Not on file   Social History Narrative    Allergies  Allergen Reactions  . Darvocet [Propoxyphene N-Acetaminophen]     Propoxyphene (Per MAR)  . Fish Oil Other (See Comments)    unk  . Lactose Intolerance (Gi) Other (See Comments)    unk   . Loratadine [Loratadine] Other (See Comments)    dizziness (PER MAR)  . Penicillins Swelling    Swelling of hands (per [facility] records)    Current Outpatient Prescriptions on File Prior to Visit  Medication Sig Dispense Refill  . acetaminophen (TYLENOL) 500 MG tablet Take 500 mg by mouth every 4 (four) hours as needed for mild pain.    Marland Kitchen ALPRAZolam (XANAX) 0.25 MG tablet Take 1 tablet (0.25 mg total) by mouth 2 (two) times daily as needed for anxiety. 30 tablet 0  . anti-nausea (EMETROL) solution  Take 5 mLs by mouth every 15 (fifteen) minutes as needed for nausea or vomiting.    . Budesonide (PULMICORT FLEXHALER) 90 MCG/ACT inhaler Inhale 1 puff into the lungs 2 (two) times daily.    . cholecalciferol (VITAMIN D) 400 UNITS TABS Take 400 Units by mouth 2 (two) times daily.    . ciprofloxacin (CIPRO) 500 MG tablet Take 1 tablet (500 mg total) by mouth 2 (two) times daily. 18 tablet 0  . cyanocobalamin (V-R VITAMIN B-12) 500 MCG tablet Take 1 tablet (500 mcg total) by mouth daily. 30 tablet 11  . donepezil (ARICEPT) 5 MG tablet Take 1 tablet (5 mg total) by mouth at bedtime. 30 tablet 11  . feeding supplement (ENSURE IMMUNE HEALTH) LIQD Take 237 mLs by mouth 2 (two)  times daily.    . ferrous sulfate 325 (65 FE) MG tablet Take 325 mg by mouth daily with breakfast.    . furosemide (LASIX) 20 MG tablet Take 20 mg by mouth every other day.     . levothyroxine (SYNTHROID, LEVOTHROID) 50 MCG tablet Take 50 mcg by mouth daily.    Marland Kitchen lisinopril (PRINIVIL,ZESTRIL) 10 MG tablet Take 10 mg by mouth daily.    Marland Kitchen loperamide (IMODIUM) 2 MG capsule Take 2 mg by mouth as needed for diarrhea or loose stools.    . miconazole (MICOTIN) 2 % cream Apply 1 application topically 2 (two) times daily.    . predniSONE (DELTASONE) 5 MG tablet Take 5 mg by mouth 2 (two) times daily.    . promethazine (PHENERGAN) 12.5 MG tablet Take 12.5 mg by mouth every 8 (eight) hours as needed for nausea or vomiting.     No current facility-administered medications on file prior to visit.       Physical Examination    Filed Vitals:   06/24/14 1146  BP: 136/55  Pulse: 56  Height: 5\' 7"  (1.702 m)  Weight: 111 lb (50.349 kg)  SpO2: 100%     General:  Alert and oriented, no acute distress HEENT: Normal Neck: No bruit or JVD Pulmonary: Clear to auscultation bilaterally Cardiac: Regular Rate and Rhythm without murmur Abdomen: Soft, non-tender, non-distended, normal bowel sounds, no pulsatile mass, large incarcerated left inguinal hernia nontender approximately 12 x 12 cm Extremities: 2+ femoral pulses   DATA:  CT angiogram the abdomen and pelvis images were reviewed today.  Current aneurysm diameter is  5.4.  There is evidence of a type II endoleak just above the aortic bifurcation. The top portion of the stent graft is adjacent to the renal arteries and there is no evidence of migration.   ASSESSMENT:  Doing well status post Gore Excluder stent graft repair aneurysm. Type II endoleak.   PLAN: Patient will return in 5 months with a CT angiogram to review his stent graft.   [name], MD [facility] [place] Office: [phone] Pager: [phone]

## 2014-08-11 ENCOUNTER — Telehealth: Payer: Self-pay | Admitting: Family Medicine

## 2014-08-11 NOTE — Telephone Encounter (Signed)
Pt given appt with Evelina Dun  Tomorrow at 4:10.

## 2014-08-12 ENCOUNTER — Ambulatory Visit: Payer: Self-pay | Admitting: Family

## 2014-11-10 ENCOUNTER — Telehealth: Payer: Self-pay | Admitting: Family

## 2014-11-10 ENCOUNTER — Ambulatory Visit (INDEPENDENT_AMBULATORY_CARE_PROVIDER_SITE_OTHER): Payer: Medicare Other | Admitting: Family

## 2014-11-10 ENCOUNTER — Encounter: Payer: Self-pay | Admitting: Family

## 2014-11-10 VITALS — BP 114/60 | HR 57 | Temp 97.3°F | Ht 67.0 in | Wt 113.0 lb

## 2014-11-10 DIAGNOSIS — H01009 Unspecified blepharitis unspecified eye, unspecified eyelid: Secondary | ICD-10-CM

## 2014-11-10 DIAGNOSIS — R531 Weakness: Secondary | ICD-10-CM | POA: Diagnosis not present

## 2014-11-10 DIAGNOSIS — R5383 Other fatigue: Secondary | ICD-10-CM | POA: Diagnosis not present

## 2014-11-10 MED ORDER — BACITRACIN-POLYMYXIN B 500-10000 UNIT/GM OP OINT: 1.0000 "application " | TOPICAL_OINTMENT | Freq: Three times a day (TID) | OPHTHALMIC | Status: DC

## 2014-11-10 NOTE — Telephone Encounter (Signed)
Please see other note

## 2014-11-10 NOTE — Telephone Encounter (Signed)
Orders sent in and faxed copy to [facility]

## 2014-11-10 NOTE — Progress Notes (Signed)
   Subjective:    Patient ID: Corey Walker, male    DOB: January 03, 1918, 81 y.o.   MRN: [id number]  Fall The accident occurred 5 to 7 days ago. The fall occurred while walking. He landed on concrete. There was no blood loss. The pain is present in the buttocks. The pain is at a severity of 8/10. The pain is mild. The symptoms are aggravated by ambulation. Pertinent negatives include no fever. He has tried acetaminophen for the symptoms. The treatment provided mild relief.  Conjunctivitis  The current episode started yesterday. The problem occurs continuously. The problem has been unchanged. The problem is mild. Associated symptoms include eye discharge, eye pain and eye redness. Pertinent negatives include no fever, no double vision, no eye itching, no photophobia and no cough.      Review of Systems  Constitutional: Negative.  Negative for fever.  HENT: Negative.   Eyes: Positive for pain, discharge and redness. Negative for double vision, photophobia and itching.  Respiratory: Negative.  Negative for cough.   Cardiovascular: Negative.   Gastrointestinal: Negative.   Endocrine: Negative.   Musculoskeletal: Negative.   Neurological: Negative.   Hematological: Negative.   Psychiatric/Behavioral: Negative.   All other systems reviewed and are negative.      Objective:   Physical Exam  Constitutional: He is oriented to person, place, and time. He appears well-developed and well-nourished. No distress.  HENT:  Head: Normocephalic.  Right Ear: External ear normal.  Left Ear: External ear normal.  Mouth/Throat: Oropharynx is clear and moist.  Eyes: Pupils are equal, round, and reactive to light. Right eye exhibits no discharge. Left eye exhibits no discharge.  Neck: Normal range of motion. Neck supple. No thyromegaly present.  Cardiovascular: Normal rate, regular rhythm, normal heart sounds and intact distal pulses.   No murmur heard. Pulmonary/Chest: Effort normal and breath sounds  normal. No respiratory distress. He has no wheezes.  Abdominal: Soft. Bowel sounds are normal. He exhibits no distension. There is no tenderness.  Musculoskeletal: Normal range of motion. He exhibits no edema or tenderness.  Neurological: He is alert and oriented to person, place, and time. He has normal reflexes. No cranial nerve deficit.  Skin: Skin is warm and dry. No rash noted. No erythema.  Psychiatric: He has a normal mood and affect. His behavior is normal. Judgment and thought content normal.  Vitals reviewed.     BP 114/60 mmHg  Pulse 57  Temp(Src) 97.3 F (36.3 C) (Oral)  Ht $R'5\' 7"'hG$  (1.702 m)  Wt 113 lb (51.256 kg)  BMI 17.69 kg/m2     Assessment & Plan:  1. Blepharitis, unspecified laterality -Good hand hygiene discussed -Do not rub eyes - CMP14+EGFR - bacitracin-polymyxin b (POLYSPORIN) ophthalmic ointment; Place 1 application into both eyes 3 (three) times daily. apply to eye every 12 hours while awake  Dispense: 3.5 g; Refill: 0  2. Weakness -Falls precaution discussed - Anemia Profile B - CMP14+EGFR  3. Other fatigue -Falls precaution disscussed - Anemia Profile B - CMP14+EGFR  Evelina Dun, FNP

## 2014-11-10 NOTE — Telephone Encounter (Signed)
Can this be ordered and printed and i will fax to [place]

## 2014-11-10 NOTE — Patient Instructions (Addendum)
Blepharitis Blepharitis is redness, soreness, and swelling (inflammation) of one or both eyelids. It may be caused by an allergic reaction or a bacterial infection. Blepharitis may also be associated with reddened, scaly skin (seborrhea) of the scalp and eyebrows. While you sleep, eye discharge may cause your eyelashes to stick together. Your eyelids may itch, burn, swell, and may lose their lashes. These will grow back. Your eyes may become sensitive. Blepharitis may recur and need repeated treatment. If this is the case, you may require further evaluation by an eye specialist (ophthalmologist). HOME CARE INSTRUCTIONS   Keep your hands clean.  Use a clean towel each time you dry your eyelids. Do not use this towel to clean other areas. Do not share a towel or makeup with anyone.  Wash your eyelids with warm water or warm water mixed with a small amount of baby shampoo. Do this twice a day or as often as needed.  Wash your face and eyebrows at least once a day.  Use warm compresses 2 times a day for 10 minutes at a time, or as directed by your caregiver.  Apply antibiotic ointment as directed by your caregiver.  Avoid rubbing your eyes.  Avoid wearing makeup until you get better.  Follow up with your caregiver as directed. SEEK IMMEDIATE MEDICAL CARE IF:   You have pain, redness, or swelling that gets worse or spreads to other parts of your face.  Your vision changes, or you have pain when looking at lights or moving objects.  You have a fever.  Your symptoms continue for longer than 2 to 4 days or become worse. MAKE SURE YOU:   Understand these instructions.  Will watch your condition.  Will get help right away if you are not doing well or get worse. Document Released: 05/11/2000 Document Revised: 08/06/2011 Document Reviewed: 06/21/2010 [facility] Patient Information 2015 [place], [place]. This information is not intended to replace advice given to you by your health care  provider. Make sure you discuss any questions you have with your health care provider. Weakness Weakness is a lack of strength. You may feel weak all over your body or just in one part of your body. Weakness can be serious. In some cases, you may need more medical tests. HOME [place] a well-balanced diet.  Try to exercise every day.  Only take medicines as told by your doctor. GET HELP RIGHT AWAY IF:   You cannot do your normal daily activities.  You cannot walk up and down stairs, or you feel very tired when you do so.  You have shortness of breath or chest pain.  You have trouble moving parts of your body.  You have weakness in only one body part or on only one side of the body.  You have a fever.  You have trouble speaking or swallowing.  You cannot control when you pee (urinate) or poop (bowel movement).  You have black or bloody throw up (vomit) or poop.  Your weakness gets worse or spreads to other body parts.  You have new aches or pains. MAKE SURE YOU:   Understand these instructions.  Will watch your condition.  Will get help right away if you are not doing well or get worse. Document Released: 04/26/2008 Document Revised: 11/13/2011 Document Reviewed: 07/13/2011 [facility] Patient Information 2015 [place], [place]. This information is not intended to replace advice given to you by your health care provider. Make sure you discuss any questions you have with your  health care provider.  

## 2014-11-11 LAB — CMP14+EGFR
ALBUMIN: 3.7 g/dL (ref 3.2–4.6)
ALT: 19 IU/L (ref 0–44)
AST: 21 IU/L (ref 0–40)
Albumin/Globulin Ratio: 1.4 (ref 1.1–2.5)
Alkaline Phosphatase: 163 IU/L — ABNORMAL HIGH (ref 39–117)
BUN/Creatinine Ratio: 15 (ref 10–22)
BUN: 17 mg/dL (ref 10–36)
Bilirubin Total: 0.5 mg/dL (ref 0.0–1.2)
CHLORIDE: 104 mmol/L (ref 97–108)
CO2: 21 mmol/L (ref 18–29)
Calcium: 9.1 mg/dL (ref 8.6–10.2)
Creatinine, Ser: 1.11 mg/dL (ref 0.76–1.27)
GFR calc non Af Amer: 56 mL/min/{1.73_m2} — ABNORMAL LOW (ref >59)
GFR, EST AFRICAN AMERICAN: 65 mL/min/{1.73_m2} (ref >59)
GLUCOSE: 94 mg/dL (ref 65–99)
Globulin, Total: 2.7 g/dL (ref 1.5–4.5)
Potassium: 4.2 mmol/L (ref 3.5–5.2)
Sodium: 143 mmol/L (ref 134–144)
TOTAL PROTEIN: 6.4 g/dL (ref 6.0–8.5)

## 2014-11-11 LAB — ANEMIA PROFILE B
Basophils Absolute: 0 10*3/uL (ref 0.0–0.2)
Basos: 0 %
EOS (ABSOLUTE): 0.3 10*3/uL (ref 0.0–0.4)
Eos: 3 %
FERRITIN: 397 ng/mL (ref 30–400)
FOLATE: 10 ng/mL (ref >3.0)
HEMATOCRIT: 37.3 % — AB (ref 37.5–51.0)
Hemoglobin: 12.8 g/dL (ref 12.6–17.7)
IRON SATURATION: 12 % — AB (ref 15–55)
Immature Grans (Abs): 0 10*3/uL (ref 0.0–0.1)
Immature Granulocytes: 0 %
Iron: 24 ug/dL — ABNORMAL LOW (ref 38–169)
LYMPHS ABS: 1 10*3/uL (ref 0.7–3.1)
Lymphs: 12 %
MCH: 30.3 pg (ref 26.6–33.0)
MCHC: 34.3 g/dL (ref 31.5–35.7)
MCV: 88 fL (ref 79–97)
Monocytes Absolute: 0.6 10*3/uL (ref 0.1–0.9)
Monocytes: 7 %
NEUTROS ABS: 6.5 10*3/uL (ref 1.4–7.0)
Neutrophils: 78 %
Platelets: 215 10*3/uL (ref 150–379)
RBC: 4.22 x10E6/uL (ref 4.14–5.80)
RDW: 13.7 % (ref 12.3–15.4)
Retic Ct Pct: 0.7 % (ref 0.6–2.6)
Total Iron Binding Capacity: 200 ug/dL — ABNORMAL LOW (ref 250–450)
UIBC: 176 ug/dL (ref 111–343)
Vitamin B-12: 486 pg/mL (ref 211–946)
WBC: 8.5 10*3/uL (ref 3.4–10.8)

## 2014-11-12 ENCOUNTER — Other Ambulatory Visit: Payer: Self-pay | Admitting: Family

## 2014-11-12 MED ORDER — FERROUS SULFATE 325 (65 FE) MG PO TABS: 650.0000 mg | ORAL_TABLET | Freq: Every day | ORAL | Status: AC

## 2014-11-23 ENCOUNTER — Telehealth: Payer: Self-pay | Admitting: Family Medicine

## 2014-11-23 NOTE — Telephone Encounter (Signed)
Please review and advise.

## 2014-11-24 ENCOUNTER — Encounter: Payer: Self-pay | Admitting: Vascular Surgery

## 2014-11-24 ENCOUNTER — Other Ambulatory Visit: Payer: Self-pay | Admitting: *Deleted

## 2014-11-24 DIAGNOSIS — Z01812 Encounter for preprocedural laboratory examination: Secondary | ICD-10-CM

## 2014-11-24 LAB — BUN: BUN: 26 mg/dL — AB (ref 6–23)

## 2014-11-24 LAB — CREATININE, SERUM: Creat: 1.13 mg/dL (ref 0.50–1.35)

## 2014-11-24 NOTE — Telephone Encounter (Signed)
Letter created. Please fax to [employer], Fax # [phone] Attn: Dorena Cookey

## 2014-11-24 NOTE — Telephone Encounter (Signed)
Letter faxed to [employer] Pt's daughter notified

## 2014-11-25 ENCOUNTER — Encounter: Payer: Self-pay | Admitting: Vascular Surgery

## 2014-11-25 ENCOUNTER — Ambulatory Visit
Admission: RE | Admit: 2014-11-25 | Discharge: 2014-11-25 | Disposition: A | Discharge status: Home / Self Care | Payer: Medicare Other | Source: Ambulatory Visit | Attending: Vascular Surgery | Admitting: Vascular Surgery

## 2014-11-25 ENCOUNTER — Ambulatory Visit (INDEPENDENT_AMBULATORY_CARE_PROVIDER_SITE_OTHER): Payer: Medicare Other | Admitting: Vascular Surgery

## 2014-11-25 VITALS — BP 129/80 | HR 76 | Resp 14 | Ht 67.0 in | Wt 110.0 lb

## 2014-11-25 DIAGNOSIS — I714 Abdominal aortic aneurysm, without rupture, unspecified: Secondary | ICD-10-CM

## 2014-11-25 DIAGNOSIS — Z48812 Encounter for surgical aftercare following surgery on the circulatory system: Secondary | ICD-10-CM | POA: Diagnosis not present

## 2014-11-25 DIAGNOSIS — Z95828 Presence of other vascular implants and grafts: Secondary | ICD-10-CM

## 2014-11-25 MED ORDER — IOPAMIDOL (ISOVUE-370) INJECTION 76%
75.0000 mL | Freq: Once | INTRAVENOUS | Status: AC | PRN
  Administered 2014-11-25: 75 mL via INTRAVENOUS

## 2014-11-25 NOTE — Addendum Note (Signed)
Addended by: Dorthula Rue L on: 11/25/2014 04:32 PM   Modules accepted: Orders

## 2014-11-25 NOTE — Progress Notes (Signed)
AAA Progress Note    11/25/2014 1:50 PM   Corey Walker is a 81 y.o.year old male who presents for follow-up evaluation of AAA. He underwent Gore Excluder aneurysm stent graft repair 05/25/2014.  He denies any new medical issues since his last visit 06/24/2014.  He has bought of constipation and diarrhea, no SOB, no changes in vision and no CP.  Past medical history includes hypertension, polymyalgia rheumatica, left inguinal hernia,  osteoporosis, benign prostatic hypertrophy which are all currently stable and followed by his primary care physician.     Filed Vitals:   11/25/14 1322  BP: 129/80  Pulse: 76  Resp: 14    Physical Exam: Cardiac:  RRR, no murmur noted Lungs:  CTA bilaterally Abdomen:  Soft without pulsatile mass, slight tenderness to palpation, soft palpable inguinal left hernia Incisions:  Fully healed Extremities:  Palpable femoral and DP 2+ pulses   CBC    Component Value Date/Time   WBC 8.5 11/10/2014 1137   WBC 6.6 05/26/2014 0400   WBC 8.1 03/05/2013 1601   RBC 4.22 11/10/2014 1137   RBC 3.76* 05/26/2014 0400   RBC 5.2 03/05/2013 1601   HGB 11.0* 05/26/2014 0400   HGB 15.6 03/05/2013 1601   HCT 37.3* 11/10/2014 1137   HCT 34.1* 05/26/2014 0400   HCT 47.0 03/05/2013 1601   PLT 117* 05/26/2014 0400   MCV 90.7 05/26/2014 0400   MCV 90.1 03/05/2013 1601   MCH 30.3 11/10/2014 1137   MCH 29.3 05/26/2014 0400   MCH 29.9 03/05/2013 1601   MCHC 34.3 11/10/2014 1137   MCHC 32.3 05/26/2014 0400   MCHC 33.2 03/05/2013 1601   RDW 13.7 11/10/2014 1137   RDW 14.4 05/26/2014 0400   LYMPHSABS 1.0 11/10/2014 1137   LYMPHSABS 0.7 04/11/2014 0843   MONOABS 0.7 04/11/2014 0843   EOSABS 0.0 04/11/2014 0843   EOSABS 0.0 09/23/2013 1739   BASOSABS 0.0 11/10/2014 1137   BASOSABS 0.0 04/11/2014 0843    BMET    Component Value Date/Time   NA 143 11/10/2014 1137   NA 137 05/26/2014 0400   K 4.2 11/10/2014 1137   CL 104 11/10/2014 1137   CO2 21 11/10/2014 1137     GLUCOSE 94 11/10/2014 1137   GLUCOSE 100* 05/26/2014 0400   BUN 26* 11/24/2014 0951   BUN 17 11/10/2014 1137   CREATININE 1.13 11/24/2014 0951   CREATININE 1.11 11/10/2014 1137   CALCIUM 9.1 11/10/2014 1137   GFRNONAA 56* 11/10/2014 1137   GFRAA 65 11/10/2014 1137    INR    Component Value Date/Time   INR 0.98 05/17/2014 1429    CTA was reviewed with Dr. Oneida Alar of the abdomin and pelvis: Current aneurysm diameter is 5.4 without change from previous CTA on 06/24/2014. There is evidence of a type II endoleak just above the aortic bifurcation. The top portion of the stent graft is adjacent to the renal arteries and there is no evidence of migration.   Assessment/Plan:  81 y.o. male is s/p status post Gore Excluder stent graft repair aneurysm. Type II endoleak.  He is doing well without lumbar or new abdominal pain since his surgery.  Plan he will f/u in 6 months for AAA EVAR duplex   Theda Sers, [name] PA-C   [facility]  11/25/2014 1:50 PM   History and exam details as above. Patient status post endovascular repair of abdominal aortic aneurysm. He has a stable type II leak. He has had no expansion of his aneurysm.  He will follow-up in 6 months time.  [name], MD [facility] [place] Office: [phone] Pager: [phone]   VASCULAR QUALITY INITIATIVE FOLLOW UP DATA:  Current smoker: [  ] yes  [ x ] no  Living status: [  ]  Home  [ x ] Nursing home  [  ] Homeless    MEDS:  ASA [  ] yes  [ x ] no- [ x ] medical reason  [  ] non compliant  STATIN  [  ] yes  [ x ] no- [ x ] medical reason  [  ] non compliant  Beta blocker [  ] yes  [x  ] no- [x  ] medical reason  [  ] non compliant  ACE inhibitor [x  ] yes  [  ] no- [  ] medical reason  [  ] non compliant  P2Y12 Antagonist [ x ] none  [  ] clopidogrel-Plavix  [  ] ticlopidine-Ticlid   [  ] prasugrel-Effient  [  ] ticagrelor- Brilinta    Anticoagulant [x   ] None  [  ] warfarin  [  ] rivaroxaban-Xarelto [  ] dabigatran- Pradaxa  Number of subsequent operations related to AAA = 1  Subsequent operation performed for: [  ] Incision [  ] Graft [  ] Intestine       [  ] leg ischemia

## 2015-02-21 ENCOUNTER — Ambulatory Visit (INDEPENDENT_AMBULATORY_CARE_PROVIDER_SITE_OTHER): Payer: Medicare Other | Admitting: Family Medicine

## 2015-02-21 ENCOUNTER — Encounter: Payer: Self-pay | Admitting: Family Medicine

## 2015-02-21 VITALS — Temp 96.7°F | Ht 67.0 in | Wt 109.6 lb

## 2015-02-21 DIAGNOSIS — Z0000 Encounter for general adult medical examination without abnormal findings: Secondary | ICD-10-CM

## 2015-02-21 DIAGNOSIS — Z23 Encounter for immunization: Secondary | ICD-10-CM

## 2015-02-21 NOTE — Progress Notes (Signed)
Subjective:    Corey Walker is a 81 y.o. male who presents for Medicare Annual/Subsequent preventive examination.   Preventive Screening-Counseling & Management  Tobacco History  Smoking status  . Never Smoker   Smokeless tobacco  . Not on file    Problems Prior to Visit 1. See below  Current Problems (verified) Patient Active Problem List   Diagnosis Date Noted  . Preoperative clearance 04/21/2014  . CKD (chronic kidney disease), stage III 04/12/2014  . Lactic acidosis 04/11/2014  . Inguinal hernia with obstruction 04/11/2014  . Pancreatitis 05/19/2013  . Transaminitis 05/19/2013  . Hypokalemia 05/19/2013  . Diarrhea 05/19/2013  . Hypothyroidism 07/15/2011  . AAA (abdominal aortic aneurysm) 07/15/2011  . Obstructive jaundice 07/12/2011  . HTN (hypertension) 07/12/2011  . Polymyalgia rheumatica 07/12/2011  . H/O colon cancer, stage I 07/12/2011    Medications Prior to Visit Current Outpatient Prescriptions on File Prior to Visit  Medication Sig Dispense Refill  . acetaminophen (TYLENOL) 500 MG tablet Take 500 mg by mouth every 4 (four) hours as needed for mild pain.    Marland Kitchen anti-nausea (EMETROL) solution Take 5 mLs by mouth every 15 (fifteen) minutes as needed for nausea or vomiting.    . cholecalciferol (VITAMIN D) 400 UNITS TABS Take 400 Units by mouth 2 (two) times daily.    . cyanocobalamin (V-R VITAMIN B-12) 500 MCG tablet Take 1 tablet (500 mcg total) by mouth daily. 30 tablet 11  . donepezil (ARICEPT) 5 MG tablet Take 1 tablet (5 mg total) by mouth at bedtime. 30 tablet 11  . feeding supplement (ENSURE IMMUNE HEALTH) LIQD Take 237 mLs by mouth 2 (two) times daily.    . ferrous sulfate 325 (65 FE) MG tablet Take 2 tablets (650 mg total) by mouth daily with breakfast. 180 tablet 2  . furosemide (LASIX) 20 MG tablet Take 20 mg by mouth every other day.     . levothyroxine (SYNTHROID, LEVOTHROID) 50 MCG tablet Take 50 mcg by mouth daily.    Marland Kitchen lisinopril (PRINIVIL,ZESTRIL)  10 MG tablet Take 10 mg by mouth daily.    Marland Kitchen loperamide (IMODIUM) 2 MG capsule Take 2 mg by mouth as needed for diarrhea or loose stools.    . predniSONE (DELTASONE) 5 MG tablet Take 5 mg by mouth 2 (two) times daily.    . promethazine (PHENERGAN) 12.5 MG tablet Take 12.5 mg by mouth every 8 (eight) hours as needed for nausea or vomiting.     No current facility-administered medications on file prior to visit.    Current Medications (verified) Current Outpatient Prescriptions  Medication Sig Dispense Refill  . acetaminophen (TYLENOL) 500 MG tablet Take 500 mg by mouth every 4 (four) hours as needed for mild pain.    Marland Kitchen anti-nausea (EMETROL) solution Take 5 mLs by mouth every 15 (fifteen) minutes as needed for nausea or vomiting.    . cholecalciferol (VITAMIN D) 400 UNITS TABS Take 400 Units by mouth 2 (two) times daily.    . cyanocobalamin (V-R VITAMIN B-12) 500 MCG tablet Take 1 tablet (500 mcg total) by mouth daily. 30 tablet 11  . DOCUSATE CALCIUM PO Take by mouth.    . donepezil (ARICEPT) 5 MG tablet Take 1 tablet (5 mg total) by mouth at bedtime. 30 tablet 11  . feeding supplement (ENSURE IMMUNE HEALTH) LIQD Take 237 mLs by mouth 2 (two) times daily.    . ferrous sulfate 325 (65 FE) MG tablet Take 2 tablets (650 mg total) by mouth daily with  breakfast. 180 tablet 2  . furosemide (LASIX) 20 MG tablet Take 20 mg by mouth every other day.     . levothyroxine (SYNTHROID, LEVOTHROID) 50 MCG tablet Take 50 mcg by mouth daily.    Marland Kitchen lisinopril (PRINIVIL,ZESTRIL) 10 MG tablet Take 10 mg by mouth daily.    Marland Kitchen loperamide (IMODIUM) 2 MG capsule Take 2 mg by mouth as needed for diarrhea or loose stools.    . predniSONE (DELTASONE) 5 MG tablet Take 5 mg by mouth 2 (two) times daily.    . promethazine (PHENERGAN) 12.5 MG tablet Take 12.5 mg by mouth every 8 (eight) hours as needed for nausea or vomiting.     No current facility-administered medications for this visit.     Allergies  (verified) Darvocet; Fish oil; Lactose intolerance (gi); Loratadine; and Penicillins   PAST HISTORY  Family History No family history on file.  Social History Social History  Substance Use Topics  . Smoking status: Never Smoker   . Smokeless tobacco: Not on file  . Alcohol Use: No    Are there smokers in your home (other than you)?  No  Risk Factors Current exercise habits: walks with walker and does regular arm exercises in bed  Dietary issues discussed: none   Cardiac risk factors: advanced age (older than 30 for men, 15 for women), hypertension and male gender.  Depression Screen (Note: if answer to either of the following is "Yes", a more complete depression screening is indicated)   Q1: Over the past two weeks, have you felt down, depressed or hopeless? No  Q2: Over the past two weeks, have you felt little interest or pleasure in doing things? No  Have you lost interest or pleasure in daily life? No  Do you often feel hopeless? No  Do you cry easily over simple problems? No  Activities of Daily Living In your present state of health, do you have any difficulty performing the following activities?:  Driving? Yes Managing money?  Yes Feeding yourself? No Getting from bed to chair? No Climbing a flight of stairs? Yes Preparing food and eating?: can eat, no cooking Bathing or showering? No Getting dressed: No Getting to the toilet? No Using the toilet:No Moving around from place to place: No In the past year have you fallen or had a near fall?:Yes   Are you sexually active?  No  Do you have more than one partner?  No  Hearing Difficulties: Yes Do you often ask people to speak up or repeat themselves? Yes Do you experience ringing or noises in your ears? Yes Do you have difficulty understanding soft or whispered voices? Yes   Do you feel that you have a problem with memory? No  Do you often misplace items? Yes  Do you feel safe at home?  Yes  Cognitive  Testing  Alert? Yes  Normal Appearance?Yes  Oriented to person? Yes  Place? Yes   Time? No  Recall of three objects?  No  Can perform simple calculations? Yes  Displays appropriate judgment?Yes  Can read the correct time from a watch face?Yes   Advanced Directives have been discussed with the patient? Yes   List the Names of Other Physician/Practitioners you currently use: 1.    Indicate any recent Medical Services you may have received from other than [facility] providers in the past year (date may be approximate).  Immunization History  Administered Date(s) Administered  . Influenza,inj,Quad PF,36+ Mos 04/12/2014  . Pneumococcal Polysaccharide-23 04/12/2014  Screening Tests Health Maintenance  Topic Date Due  . TETANUS/TDAP  [birth year]/1938  . ZOSTAVAX  08/24/1979  . INFLUENZA VACCINE  12/27/2014  . PNA vac Low Risk Adult (2 of 2 - PCV13) 04/13/2015    All answers were reviewed with the patient and necessary referrals were made:  Kenn File, MD   02/21/2015   History reviewed: allergies, current medications, past family history, past medical history, past social history, past surgical history and problem list  Review of Systems Pertinent items are noted in HPI.    Objective:      Temperature 96.7 F (35.9 C), temperature source Oral, height 5\' 7"  (1.702 m), weight 109 lb 9.6 oz (49.714 kg). Body mass index is 17.16 kg/(m^2). Gen: NAD, alert, cooperative with exam HEENT: NCAT CV: RRR, good S1/S2, no murmur Resp: CTABL, no wheezes, non-labored Ext: No edema, warm Neuro: Alert and oriented to person and place, not to time     Assessment:     Mr. Rowe is a pleasant 81 y/o male here for his medicre annual wellness visit. He is doing well and has no complaints except for hand arthritis. He is very happy in general but does mention that he misses mowing his grass. He likely has mild to moderate dementia and is on aricept, although its n=benefit at this pont is  likely limited.   He has ben given a  Flu shot today, he has a HCPOA      Plan:     During the course of the visit the patient was educated and counseled about appropriate screening and preventive services including:    Influenza vaccine  Diet review for nutrition referral? no   Patient Instructions (the written plan) was given to the patient.  Medicare Attestation I have personally reviewed: The patient's medical and social history Their use of alcohol, tobacco or illicit drugs Their current medications and supplements The patient's functional ability including ADLs,fall risks, home safety risks, cognitive, and hearing and visual impairment Diet and physical activities Evidence for depression or mood disorders  The patient's weight, height, BMI, and visual acuity have been recorded in the chart.  I have made referrals, counseling, and provided education to the patient based on review of the above and I have provided the patient with a written personalized care plan for preventive services.     Kenn File, MD   02/21/2015

## 2015-03-17 ENCOUNTER — Ambulatory Visit (INDEPENDENT_AMBULATORY_CARE_PROVIDER_SITE_OTHER): Payer: Medicare Other | Admitting: Family Medicine

## 2015-03-17 ENCOUNTER — Encounter: Payer: Self-pay | Admitting: Family Medicine

## 2015-03-17 VITALS — BP 104/62 | HR 71 | Temp 99.0°F | Ht 67.0 in | Wt 109.8 lb

## 2015-03-17 DIAGNOSIS — L089 Local infection of the skin and subcutaneous tissue, unspecified: Secondary | ICD-10-CM | POA: Diagnosis not present

## 2015-03-17 DIAGNOSIS — R05 Cough: Secondary | ICD-10-CM | POA: Diagnosis not present

## 2015-03-17 DIAGNOSIS — R059 Cough, unspecified: Secondary | ICD-10-CM

## 2015-03-17 MED ORDER — BENZONATATE 100 MG PO CAPS: 100.0000 mg | ORAL_CAPSULE | Freq: Two times a day (BID) | ORAL | Status: DC | PRN

## 2015-03-17 MED ORDER — DOXYCYCLINE HYCLATE 100 MG PO TABS: 100.0000 mg | ORAL_TABLET | Freq: Two times a day (BID) | ORAL | Status: DC

## 2015-03-17 NOTE — Progress Notes (Signed)
   HPI  Patient presents today for evaluation of cough and right fingernail avulsion.  He is a resident of [facility]  Patient explains that he's had an irritating dry cough worse at night for about 2 days. He also endorses rhinorrhea He has stable loose stools that has not changed since this time.  Right fingernail avulsion He is unable to explain when this started, the representative for the assisted living facility also is unaware when it started, and was unaware of it in general. He denies fever, chills, sweats, loss of appetite. It only hurts whenever he hits it  PMH: Smoking status noted ROS: Per HPI  Objective: BP 104/62 mmHg  Pulse 71  Temp(Src) 99 F (37.2 C) (Oral)  Ht 5\' 7"  (1.702 m)  Wt 109 lb 12.8 oz (49.805 kg)  BMI 17.19 kg/m2 Gen: NAD, alert, cooperative with exam HEENT: NCAT CV: RRR, good S1/S2, no murmur Resp: CTABL, no wheezes, non-labored Ext: No edema, warm Neuro: Alert and oriented, No gross deficits Skin: Right third finger with fingernail avulsion with approximately one fifth of the fingernail still attached in the medial and proximal areas, it has a foul smell but no erythema, tenderness (except for trying to remove nail), or induration Right second finger with yellowing of the distal 4/5 of the nail, no dark discoloration like the other one  Assessment and plan:  # Fingernail avulsion His right third finger is concerning that he is developing infection, with foul-smell I will go ahead and cover with antibiotics Doxycycline  # Cough With rhinorrhea and short course of cough I think this is likely viral I considered an x-ray to rule out pneumonia, however given that I think he should be treated for his fingernail I will go ahead and cover for pneumonia with doxycycline Also given benzonatate Lung exam reassuring   Meds ordered this encounter  Medications  . DISCONTD: cephALEXin (KEFLEX) 500 MG capsule    Sig: Take  500 mg by mouth 4 (four) times daily.  . Budesonide (PULMICORT IN)    Sig: Inhale into the lungs.  . doxycycline (VIBRA-TABS) 100 MG tablet    Sig: Take 1 tablet (100 mg total) by mouth 2 (two) times daily. 1 po bid    Dispense:  20 tablet    Refill:  0  . benzonatate (TESSALON) 100 MG capsule    Sig: Take 1 capsule (100 mg total) by mouth 2 (two) times daily as needed for cough.    Dispense:  20 capsule    Refill:  [place], MD [facility] 03/17/2015, 3:32 PM

## 2015-03-17 NOTE — Patient Instructions (Signed)
Great to see you guys!  Bring him back if he gets worse or if you have any other concerns.

## 2015-03-21 ENCOUNTER — Telehealth: Payer: Self-pay | Admitting: Family Medicine

## 2015-03-21 NOTE — Telephone Encounter (Signed)
No reason to return while still being treated with doxy. Would recommend coming back if he is worsening, otherwise not. Come back if not better by the end of the week.   Laroy Apple, MD [facility] 03/21/2015, 2:22 PM

## 2015-03-21 NOTE — Telephone Encounter (Signed)
Please advise 

## 2015-03-21 NOTE — Telephone Encounter (Signed)
Spoke to [place] at [facility] and advised of md feedback.

## 2015-04-27 ENCOUNTER — Other Ambulatory Visit: Payer: Self-pay | Admitting: Family Medicine

## 2015-04-27 NOTE — Progress Notes (Signed)
Daughter concerned with her father's weight loss.  He has ensure but drinks very little.  Could you put him on a script to help his appetite?

## 2015-04-28 NOTE — Progress Notes (Signed)
Will request appt.   Weight loss could be serious etiology.   Laroy Apple, MD [facility] 04/28/2015, 9:51 AM

## 2015-04-28 NOTE — Progress Notes (Signed)
Spoke with daughter.  She understands and will have [facility] call and schedule an appt for Mr Robe to be seen.

## 2015-05-11 ENCOUNTER — Encounter: Payer: Self-pay | Admitting: Family Medicine

## 2015-05-11 ENCOUNTER — Telehealth: Payer: Self-pay | Admitting: Family Medicine

## 2015-05-11 ENCOUNTER — Ambulatory Visit (INDEPENDENT_AMBULATORY_CARE_PROVIDER_SITE_OTHER): Payer: Medicare Other | Admitting: Family Medicine

## 2015-05-11 VITALS — BP 104/64 | HR 100 | Temp 98.1°F | Ht 67.0 in | Wt 113.1 lb

## 2015-05-11 DIAGNOSIS — J069 Acute upper respiratory infection, unspecified: Secondary | ICD-10-CM

## 2015-05-11 MED ORDER — FEXOFENADINE HCL 180 MG PO TABS: 180.0000 mg | ORAL_TABLET | Freq: Every day | ORAL | Status: AC

## 2015-05-11 NOTE — Progress Notes (Signed)
   Subjective:    Patient ID: Corey Walker, male    DOB: Mar 21, 1918, 81 y.o.   MRN: [id number]  HPI 81 year old gentleman from [facility] with a runny nose. He has not had any fevers or chills he does have some cough but seems to be more congestion in his head and then his chest. Also note that he has had complaints of loose stools and diarrhea and note that he is on Aricept. In our brief encounter today he is oriented 3 he knows where he lives he knows that he is the oldest person at [facility]. Basically I think while he may have some senile dementia probably does not have Alzheimer's.    Review of Systems  Constitutional: Negative.   HENT: Positive for congestion and rhinorrhea.   Respiratory: Positive for cough.   Cardiovascular: Negative.   Neurological: Negative.       BP 104/64 mmHg  Pulse 100  Temp(Src) 98.1 F (36.7 C) (Oral)  Ht 5\' 7"  (1.702 m)  Wt 113 lb 2 oz (51.313 kg)  BMI 17.71 kg/m2  Objective:   Physical Exam  Constitutional: He appears well-developed and well-nourished.  HENT:  Head: Normocephalic.  Mouth/Throat: Oropharynx is clear and moist.  Cardiovascular: Normal rate.   Pulmonary/Chest: Effort normal and breath sounds normal.          Assessment & Plan:  1. Acute upper respiratory infection Probably a cold possibly allergy. Will treat with fexofenadine. I note that he hasn't had allergic to loratadine but I think the compounds or different enough that it is worth a try. Also would like to hold or discontinue the Aricept for a while to see if that helps with his diarrhea and loose stools. In my experience Aricept is very effective for dementia  Wardell Honour MD

## 2015-05-12 NOTE — Telephone Encounter (Signed)
Spoke to [place] at [facility] and advised of md feedback. She voiced understanding.

## 2015-05-12 NOTE — Telephone Encounter (Signed)
Try the Allegra and if he has dizziness he should discontinue it

## 2015-05-12 NOTE — Telephone Encounter (Signed)
Pt is allergic to claritin-causes dizziness. Is allegra still ok to take.

## 2015-05-12 NOTE — Telephone Encounter (Signed)
The patient should take either Allegra or Claritin, which ever one works the best but he should not be on both of these. If he is having allergy problems frequent use of nasal saline over-the-counter could also be helpful

## 2015-05-16 ENCOUNTER — Ambulatory Visit: Payer: Medicare Other | Admitting: Family Medicine

## 2015-05-26 ENCOUNTER — Encounter: Payer: Self-pay | Admitting: Vascular Surgery

## 2015-06-01 ENCOUNTER — Other Ambulatory Visit: Payer: Self-pay | Admitting: Family Medicine

## 2015-06-01 NOTE — Telephone Encounter (Signed)
atttempted call, left VM.   I think it is likely a very reasonable request, however I think he needs to be seen prior to starting the medication.   Will ask nursing to attempt call again.

## 2015-06-01 NOTE — Telephone Encounter (Signed)
Aware and made apt 1/9 at 3:55

## 2015-06-02 ENCOUNTER — Other Ambulatory Visit (HOSPITAL_COMMUNITY): Payer: Medicare Other

## 2015-06-02 ENCOUNTER — Ambulatory Visit: Payer: Medicare Other | Admitting: Vascular Surgery

## 2015-06-06 ENCOUNTER — Ambulatory Visit: Payer: Self-pay | Admitting: Family Medicine

## 2015-06-07 ENCOUNTER — Encounter: Payer: Self-pay | Admitting: Family Medicine

## 2015-07-19 ENCOUNTER — Ambulatory Visit (INDEPENDENT_AMBULATORY_CARE_PROVIDER_SITE_OTHER): Payer: Medicare Other | Admitting: Family Medicine

## 2015-07-19 VITALS — BP 134/78 | HR 68 | Temp 97.9°F | Ht 67.0 in | Wt 110.0 lb

## 2015-07-19 DIAGNOSIS — D492 Neoplasm of unspecified behavior of bone, soft tissue, and skin: Secondary | ICD-10-CM

## 2015-07-19 NOTE — Progress Notes (Signed)
Subjective:  Patient ID: Corey Walker, male    DOB: Jan 03, 1918  Age: 82 y.o. MRN: [id number]  CC: scalp lesion   HPI Corey Walker presents for lesion on scalp present for four months, possibly longer. Protrudes from scalp. Concerned for skin cancer.   History Corey Walker has a past medical history of Hypertension; Polymyalgia rheumatica ([place]); Diverticulosis; Thyroid disease; Diarrhea; Hypokalemia; Colon cancer ([place]) (2001); Skin cancer; AAA (abdominal aortic aneurysm) ([place]); Osteoporosis; BPH (benign prostatic hyperplasia); Tinea unguium; Temporal arteritis ([place]); Obstructive jaundice; Pancreatitis; Inguinal hernia, left; and Hypothyroidism.   He has past surgical history that includes Colon surgery (2001); EUS (07/13/2011); Cholecystectomy; ERCP (N/A, 05/21/2013); Eye surgery; and Abdominal aortic endovascular stent graft (N/A, 05/25/2014).   His family history is not on file.He reports that he has never smoked. He does not have any smokeless tobacco history on file. He reports that he does not drink alcohol or use illicit drugs.    ROS Review of Systems  Constitutional: Negative.   HENT: Negative.   Respiratory: Negative.   Skin:       See HPI      Objective:  BP 134/78 mmHg  Pulse 68  Temp(Src) 97.9 F (36.6 C) (Oral)  Ht 5\' 7"  (1.702 m)  Wt 110 lb (49.896 kg)  BMI 17.22 kg/m2  BP Readings from Last 3 Encounters:  07/19/15 134/78  05/11/15 104/64  03/17/15 104/62    Wt Readings from Last 3 Encounters:  07/19/15 110 lb (49.896 kg)  05/11/15 113 lb 2 oz (51.313 kg)  03/17/15 109 lb 12.8 oz (49.805 kg)     Physical Exam  Constitutional: He appears well-developed and well-nourished.  HENT:  Head: Normocephalic and atraumatic.  Right Ear: External ear normal.  Left Ear: External ear normal.  Mouth/Throat: No oropharyngeal exudate or posterior oropharyngeal erythema.  Eyes: Pupils are equal, round, and reactive to light.  Neck: Normal range of motion. Neck  supple.  Cardiovascular: Normal rate and regular rhythm.   No murmur heard. Pulmonary/Chest: Breath sounds normal. No respiratory distress.  Abdominal: Bowel sounds are normal.  Skin:  Lesion at mid scalp/crown is white, scaly, hard and raised and tender to palpation. It is not erythematous. It measures 6 mm and protrudes 4 mm from the surface. There are 4 flat scaly white areas scattered over the bald scalp  Vitals reviewed.    Lab Results  Component Value Date   WBC 8.5 11/10/2014   HGB 11.0* 05/26/2014   HCT 37.3* 11/10/2014   PLT 215 11/10/2014   GLUCOSE 94 11/10/2014   ALT 19 11/10/2014   AST 21 11/10/2014   NA 143 11/10/2014   K 4.2 11/10/2014   CL 104 11/10/2014   CREATININE 1.13 11/24/2014   BUN 26* 11/24/2014   CO2 21 11/10/2014   TSH 3.160 09/23/2013   INR 0.98 05/17/2014    Ct Angio Abd/pel W/ And/or W/o  11/25/2014  CLINICAL DATA:  Status post you have are to repair abdominal aortic aneurysm on 05/25/2014. EXAM: CTA ABDOMEN AND PELVIS WITHOUT CONTRAST TECHNIQUE: Multidetector CT imaging of the abdomen and pelvis was performed using the standard protocol during bolus administration of intravenous contrast. Multiplanar reconstructed images and MIPs were obtained and reviewed to evaluate the vascular anatomy. CONTRAST:  75 mL Isovue 370 IV COMPARISON:  06/24/2014 FINDINGS: Positioning and patency of the endograft is stable. There remains a type 2 endoleak seen on arterial and delayed phases and supplied by retrograde flow in bilateral L4 lumbar arteries. Supply  from the left lumbar artery at this level appears more prominent than on the right. Although the endoleak cavity within the aneurysm sac appears more prominent on the arterial and delayed phases of imaging, the overall sac size has not changed significantly with transverse measurements of 5.4 x 5.4 cm on image number 65 of series 5. No evidence of type 1 endoleak with good apposition of proximal and distal components.  Visceral arterial patency is stable including the celiac axis, superior mesenteric and renal arteries. 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 Stable mild plaque in proximal segments of bilateral single renal arteries. Iliac and femoral arteries distal to the endograft shows stable and normal patency bilaterally. The rest of the study demonstrates stable appearance of a large left inguinal hernia extending into the scrotum. Stable left renal cyst has a benign appearance. Status post cholecystectomy. Visualized bowel loops are unremarkable. Stable spondylosis of the lumbar spine. Review of the MIP images confirms the above findings. 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 Supply from the left lumbar artery appears more prominent than the right. Although the endoleak cavity appears more prominent on arterial and delayed phases of imaging by CTA, the overall sac size is stable, measuring 5.4 x 5.4 cm. Electronically Signed   By: Aletta Edouard M.D.   On: 11/25/2014 14:05    Assessment & Plan:   Corey Walker was seen today for scalp lesion.  Diagnoses and all orders for this visit:  Skin neoplasm -     Ambulatory referral to Dermatology      I have discontinued Corey Walker doxycycline and benzonatate. I am also having him maintain his levothyroxine, cholecalciferol, furosemide, predniSONE, lisinopril, cyanocobalamin, donepezil, feeding supplement, anti-nausea, loperamide, promethazine, acetaminophen, ferrous sulfate, DOCUSATE CALCIUM PO, Budesonide (PULMICORT IN), and fexofenadine.  No orders of the defined types were placed in this encounter.     Follow-up: Return if symptoms worsen or fail to improve.  Claretta Fraise, M.D.

## 2015-08-16 ENCOUNTER — Telehealth: Payer: Self-pay

## 2015-08-16 NOTE — Telephone Encounter (Signed)
Probably polymyalgia rheumatica

## 2015-08-16 NOTE — Telephone Encounter (Signed)
Am getting a prior auth for Prednisone   What does Corey Walker take this for?

## 2015-08-22 ENCOUNTER — Ambulatory Visit (INDEPENDENT_AMBULATORY_CARE_PROVIDER_SITE_OTHER): Payer: Medicare Other | Admitting: Family Medicine

## 2015-08-22 ENCOUNTER — Encounter: Payer: Self-pay | Admitting: Family Medicine

## 2015-08-22 VITALS — BP 112/68 | HR 84 | Temp 97.8°F | Ht 67.0 in | Wt 117.8 lb

## 2015-08-22 DIAGNOSIS — N183 Chronic kidney disease, stage 3 unspecified: Secondary | ICD-10-CM

## 2015-08-22 DIAGNOSIS — E031 Congenital hypothyroidism without goiter: Secondary | ICD-10-CM | POA: Diagnosis not present

## 2015-08-22 DIAGNOSIS — M353 Polymyalgia rheumatica: Secondary | ICD-10-CM | POA: Diagnosis not present

## 2015-08-22 DIAGNOSIS — I714 Abdominal aortic aneurysm, without rupture, unspecified: Secondary | ICD-10-CM

## 2015-08-22 DIAGNOSIS — I10 Essential (primary) hypertension: Secondary | ICD-10-CM

## 2015-08-22 MED ORDER — PREDNISONE 1 MG PO TABS: 2.0000 mg | ORAL_TABLET | Freq: Every day | ORAL | Status: DC

## 2015-08-22 MED ORDER — PREDNISONE 1 MG PO TABS: 1.0000 mg | ORAL_TABLET | Freq: Every day | ORAL | Status: AC

## 2015-08-22 MED ORDER — PREDNISONE 1 MG PO TABS: 4.0000 mg | ORAL_TABLET | Freq: Every day | ORAL | Status: DC

## 2015-08-22 MED ORDER — PREDNISONE 1 MG PO TABS: 3.0000 mg | ORAL_TABLET | Freq: Every day | ORAL | Status: DC

## 2015-08-22 NOTE — Progress Notes (Signed)
   HPI  Patient presents today here for routine follow-up.  Hypertension Denies any difficulty breathing Patient complains, lives at nursing home.  Hypothyroidism Good compliance, no complaints  AAA No abdominal pain  Polymyalgia rheumatica Patient complains denies any pain, he's been on prednisone for more than 2 years. Again good med compliance given nursing hoim ecare and documented dosing of all meds  PMH: Smoking status noted ROS: Per HPI  Objective: BP 112/68 mmHg  Pulse 84  Temp(Src) 97.8 F (36.6 C) (Oral)  Ht _0  (1.702 m)  Wt 117 lb 12.8 oz (53.434 kg)  BMI 18.45 kg/m2 Gen: NAD, alert, cooperative with exam HEENT: NCAT CV: RRR Resp: CTABL, no wheezes, non-labored Abd: SNTND, BS present, no guarding or organomegaly Ext: No edema, warm Neuro: Alert and conversational, hard of hearing, No gross deficits, pateint cheerful and singing  Assessment and plan:  # HTN No changes, well controlled Lisinopril, every other day lasix  # AAA Due for Vascular follow up, recommended scheduling appt  # Hypothyroidism Labs  # CKD3 Labs  # PMR No pain, tretament for 2+ years Titrate off of prednisone, 1 mg per month for 4 month s then DC Increase to 5 if any recurrene/flare     Orders Placed This Encounter  Procedures  . CMP14+EGFR  . CBC  . TSH  . Ferritin    Meds ordered this encounter  Medications  . predniSONE (DELTASONE) 1 MG tablet    Sig: Take 4 tablets (4 mg total) by mouth daily with breakfast.    Dispense:  120 tablet    Refill:  0    Will decrease by 1 mg per month  . predniSONE (DELTASONE) 1 MG tablet    Sig: Take 3 tablets (3 mg total) by mouth daily with breakfast.    Dispense:  90 tablet    Refill:  0    month2  . predniSONE (DELTASONE) 1 MG tablet    Sig: Take 2 tablets (2 mg total) by mouth daily with breakfast.    Dispense:  60 tablet    Refill:  0    Month 3  . predniSONE (DELTASONE) 1 MG tablet    Sig: Take 1 tablet (1  mg total) by mouth daily with breakfast.    Dispense:  30 tablet    Refill:  0    Month Red Ch[place], MD [facility] 08/22/2015, 2:51 PM

## 2015-08-22 NOTE — Patient Instructions (Addendum)
Great to see you!  I am taking him off of prednisone, lowering the dose by 1 mg per month each month This month he can take 4 mg per day Net month 3 mg per day Then 2 mg per day Then 1 mg per day Then stop  Come back in 6 months  We will call with labs within 1 week  He should get a follow up with Vein and Vascular surgery in the next 2 months or so

## 2015-08-23 ENCOUNTER — Other Ambulatory Visit: Payer: Self-pay | Admitting: Family Medicine

## 2015-08-23 LAB — CMP14+EGFR
A/G RATIO: 1.4 (ref 1.2–2.2)
ALBUMIN: 4 g/dL (ref 3.2–4.6)
ALK PHOS: 105 IU/L (ref 39–117)
ALT: 45 IU/L — ABNORMAL HIGH (ref 0–44)
AST: 45 IU/L — ABNORMAL HIGH (ref 0–40)
BUN / CREAT RATIO: 25 — AB (ref 10–22)
BUN: 28 mg/dL (ref 10–36)
Bilirubin Total: 0.5 mg/dL (ref 0.0–1.2)
CHLORIDE: 105 mmol/L (ref 96–106)
CO2: 19 mmol/L (ref 18–29)
CREATININE: 1.12 mg/dL (ref 0.76–1.27)
Calcium: 9 mg/dL (ref 8.6–10.2)
GFR calc Af Amer: 64 mL/min/{1.73_m2} (ref >59)
GFR, EST NON AFRICAN AMERICAN: 56 mL/min/{1.73_m2} — AB (ref >59)
GLOBULIN, TOTAL: 2.8 g/dL (ref 1.5–4.5)
Glucose: 119 mg/dL — ABNORMAL HIGH (ref 65–99)
POTASSIUM: 4.7 mmol/L (ref 3.5–5.2)
SODIUM: 140 mmol/L (ref 134–144)
Total Protein: 6.8 g/dL (ref 6.0–8.5)

## 2015-08-23 LAB — CBC
HEMATOCRIT: 41.4 % (ref 37.5–51.0)
Hemoglobin: 13.9 g/dL (ref 12.6–17.7)
MCH: 30.6 pg (ref 26.6–33.0)
MCHC: 33.6 g/dL (ref 31.5–35.7)
MCV: 91 fL (ref 79–97)
Platelets: 178 10*3/uL (ref 150–379)
RBC: 4.54 x10E6/uL (ref 4.14–5.80)
RDW: 14.6 % (ref 12.3–15.4)
WBC: 8.5 10*3/uL (ref 3.4–10.8)

## 2015-08-23 LAB — FERRITIN: FERRITIN: 404 ng/mL — AB (ref 30–400)

## 2015-08-23 LAB — TSH: TSH: 8.37 u[IU]/mL — ABNORMAL HIGH (ref 0.450–4.500)

## 2015-08-23 MED ORDER — LEVOTHYROXINE SODIUM 75 MCG PO TABS: 75.0000 ug | ORAL_TABLET | Freq: Every day | ORAL | Status: AC

## 2015-08-25 ENCOUNTER — Telehealth: Payer: Self-pay | Admitting: Family Medicine

## 2015-08-25 NOTE — Telephone Encounter (Signed)
Patients daughter aware of results and verbalizes understanding.

## 2015-12-04 ENCOUNTER — Inpatient Hospital Stay (HOSPITAL_COMMUNITY)
Admission: EM | Admit: 2015-12-04 | Discharge: 2015-12-08 | DRG: 469 | Disposition: A | Discharge status: Skilled Nursing Facility | Payer: Medicare Other | Attending: Family Medicine | Admitting: Family Medicine

## 2015-12-04 ENCOUNTER — Emergency Department (HOSPITAL_COMMUNITY): Payer: Medicare Other

## 2015-12-04 ENCOUNTER — Encounter (HOSPITAL_COMMUNITY): Payer: Self-pay | Admitting: Emergency Medicine

## 2015-12-04 DIAGNOSIS — M353 Polymyalgia rheumatica: Secondary | ICD-10-CM | POA: Diagnosis present

## 2015-12-04 DIAGNOSIS — E872 Acidosis: Secondary | ICD-10-CM | POA: Diagnosis present

## 2015-12-04 DIAGNOSIS — H9190 Unspecified hearing loss, unspecified ear: Secondary | ICD-10-CM | POA: Diagnosis present

## 2015-12-04 DIAGNOSIS — Z85038 Personal history of other malignant neoplasm of large intestine: Secondary | ICD-10-CM

## 2015-12-04 DIAGNOSIS — E039 Hypothyroidism, unspecified: Secondary | ICD-10-CM | POA: Diagnosis present

## 2015-12-04 DIAGNOSIS — N183 Chronic kidney disease, stage 3 unspecified: Secondary | ICD-10-CM | POA: Diagnosis present

## 2015-12-04 DIAGNOSIS — I4581 Long QT syndrome: Secondary | ICD-10-CM | POA: Diagnosis present

## 2015-12-04 DIAGNOSIS — N401 Enlarged prostate with lower urinary tract symptoms: Secondary | ICD-10-CM | POA: Diagnosis present

## 2015-12-04 DIAGNOSIS — L8990 Pressure ulcer of unspecified site, unspecified stage: Secondary | ICD-10-CM | POA: Insufficient documentation

## 2015-12-04 DIAGNOSIS — Z7952 Long term (current) use of systemic steroids: Secondary | ICD-10-CM

## 2015-12-04 DIAGNOSIS — Z96649 Presence of unspecified artificial hip joint: Secondary | ICD-10-CM

## 2015-12-04 DIAGNOSIS — I714 Abdominal aortic aneurysm, without rupture, unspecified: Secondary | ICD-10-CM | POA: Diagnosis present

## 2015-12-04 DIAGNOSIS — E43 Unspecified severe protein-calorie malnutrition: Secondary | ICD-10-CM | POA: Diagnosis present

## 2015-12-04 DIAGNOSIS — Z85828 Personal history of other malignant neoplasm of skin: Secondary | ICD-10-CM

## 2015-12-04 DIAGNOSIS — W182XXA Fall in (into) shower or empty bathtub, initial encounter: Secondary | ICD-10-CM | POA: Diagnosis present

## 2015-12-04 DIAGNOSIS — G9340 Encephalopathy, unspecified: Secondary | ICD-10-CM | POA: Diagnosis not present

## 2015-12-04 DIAGNOSIS — F0390 Unspecified dementia without behavioral disturbance: Secondary | ICD-10-CM | POA: Diagnosis present

## 2015-12-04 DIAGNOSIS — M810 Age-related osteoporosis without current pathological fracture: Secondary | ICD-10-CM | POA: Diagnosis present

## 2015-12-04 DIAGNOSIS — Z91011 Allergy to milk products: Secondary | ICD-10-CM

## 2015-12-04 DIAGNOSIS — Z7951 Long term (current) use of inhaled steroids: Secondary | ICD-10-CM

## 2015-12-04 DIAGNOSIS — S72002A Fracture of unspecified part of neck of left femur, initial encounter for closed fracture: Principal | ICD-10-CM | POA: Diagnosis present

## 2015-12-04 DIAGNOSIS — Z66 Do not resuscitate: Secondary | ICD-10-CM | POA: Diagnosis present

## 2015-12-04 DIAGNOSIS — Z8679 Personal history of other diseases of the circulatory system: Secondary | ICD-10-CM

## 2015-12-04 DIAGNOSIS — Z885 Allergy status to narcotic agent status: Secondary | ICD-10-CM

## 2015-12-04 DIAGNOSIS — K4090 Unilateral inguinal hernia, without obstruction or gangrene, not specified as recurrent: Secondary | ICD-10-CM

## 2015-12-04 DIAGNOSIS — Z91013 Allergy to seafood: Secondary | ICD-10-CM

## 2015-12-04 DIAGNOSIS — W19XXXA Unspecified fall, initial encounter: Secondary | ICD-10-CM

## 2015-12-04 DIAGNOSIS — I10 Essential (primary) hypertension: Secondary | ICD-10-CM | POA: Diagnosis present

## 2015-12-04 DIAGNOSIS — I129 Hypertensive chronic kidney disease with stage 1 through stage 4 chronic kidney disease, or unspecified chronic kidney disease: Secondary | ICD-10-CM | POA: Diagnosis present

## 2015-12-04 DIAGNOSIS — Z79899 Other long term (current) drug therapy: Secondary | ICD-10-CM

## 2015-12-04 DIAGNOSIS — R338 Other retention of urine: Secondary | ICD-10-CM | POA: Diagnosis present

## 2015-12-04 DIAGNOSIS — T148XXA Other injury of unspecified body region, initial encounter: Secondary | ICD-10-CM

## 2015-12-04 DIAGNOSIS — D649 Anemia, unspecified: Secondary | ICD-10-CM | POA: Diagnosis present

## 2015-12-04 DIAGNOSIS — J449 Chronic obstructive pulmonary disease, unspecified: Secondary | ICD-10-CM | POA: Diagnosis present

## 2015-12-04 DIAGNOSIS — J984 Other disorders of lung: Secondary | ICD-10-CM | POA: Diagnosis present

## 2015-12-04 DIAGNOSIS — E878 Other disorders of electrolyte and fluid balance, not elsewhere classified: Secondary | ICD-10-CM | POA: Diagnosis present

## 2015-12-04 DIAGNOSIS — Z880 Allergy status to penicillin: Secondary | ICD-10-CM

## 2015-12-04 LAB — BASIC METABOLIC PANEL
ANION GAP: 7 (ref 5–15)
BUN: 27 mg/dL — ABNORMAL HIGH (ref 6–20)
CALCIUM: 9 mg/dL (ref 8.9–10.3)
CO2: 19 mmol/L — ABNORMAL LOW (ref 22–32)
Chloride: 113 mmol/L — ABNORMAL HIGH (ref 101–111)
Creatinine, Ser: 1.31 mg/dL — ABNORMAL HIGH (ref 0.61–1.24)
GFR, EST AFRICAN AMERICAN: 51 mL/min — AB (ref >60)
GFR, EST NON AFRICAN AMERICAN: 44 mL/min — AB (ref >60)
GLUCOSE: 117 mg/dL — AB (ref 65–99)
Potassium: 4 mmol/L (ref 3.5–5.1)
SODIUM: 139 mmol/L (ref 135–145)

## 2015-12-04 LAB — CBC WITH DIFFERENTIAL/PLATELET
BASOS ABS: 0 10*3/uL (ref 0.0–0.1)
BASOS PCT: 0 %
Eosinophils Absolute: 0.6 10*3/uL (ref 0.0–0.7)
Eosinophils Relative: 7 %
HEMATOCRIT: 36.8 % — AB (ref 39.0–52.0)
Hemoglobin: 12.2 g/dL — ABNORMAL LOW (ref 13.0–17.0)
Lymphocytes Relative: 13 %
Lymphs Abs: 1.2 10*3/uL (ref 0.7–4.0)
MCH: 29.9 pg (ref 26.0–34.0)
MCHC: 33.2 g/dL (ref 30.0–36.0)
MCV: 90.2 fL (ref 78.0–100.0)
MONO ABS: 0.4 10*3/uL (ref 0.1–1.0)
Monocytes Relative: 4 %
NEUTROS ABS: 7 10*3/uL (ref 1.7–7.7)
NEUTROS PCT: 76 %
Platelets: 149 10*3/uL — ABNORMAL LOW (ref 150–400)
RBC: 4.08 MIL/uL — ABNORMAL LOW (ref 4.22–5.81)
RDW: 13.2 % (ref 11.5–15.5)
WBC: 9.2 10*3/uL (ref 4.0–10.5)

## 2015-12-04 MED ORDER — MORPHINE SULFATE (PF) 4 MG/ML IV SOLN
4.0000 mg | Freq: Once | INTRAVENOUS | Status: AC
  Administered 2015-12-05: 4 mg via INTRAVENOUS
  Filled 2015-12-04: qty 1

## 2015-12-04 MED ORDER — TETANUS-DIPHTH-ACELL PERTUSSIS 5-2.5-18.5 LF-MCG/0.5 IM SUSP
0.5000 mL | Freq: Once | INTRAMUSCULAR | Status: AC
  Administered 2015-12-05: 0.5 mL via INTRAMUSCULAR
  Filled 2015-12-04: qty 0.5

## 2015-12-04 MED ORDER — SODIUM CHLORIDE 0.9 % IV BOLUS (SEPSIS)
1000.0000 mL | Freq: Once | INTRAVENOUS | Status: AC
  Administered 2015-12-05: 1000 mL via INTRAVENOUS

## 2015-12-04 NOTE — ED Notes (Signed)
Patient transported to X-ray 

## 2015-12-04 NOTE — ED Notes (Signed)
Pt arrives by [employer] post fall. Pt from [facility], fell in shower and was found by staff. Skin tears to left hand and right elbow. External rotation of left leg reported. Pt alert, c/o pain in left leg.

## 2015-12-04 NOTE — ED Provider Notes (Signed)
CSN: [id number]     Arrival date & time    History   First MD Initiated Contact with Patient 12/04/15 2203     Chief Complaint  Patient presents with  . Hip Injury     (Consider location/radiation/quality/duration/timing/severity/associated sxs/prior Treatment) HPI Comments: 82 year old male with past medical history including dementia, polymyalgia rheumatica, hypertension who presents with left hip pain and left hand pain. Patient was brought in by EMS after an unwitnessed fall at his nursing facility. Patient states that he got tangled up in his walker and fell in the bathroom. He held for a while before staff found him. He complains of severe left leg pain and pain in his left hand and right elbow. He is unable to elaborate any further.  LEVEL 5 CAVEAT DUE TO PATIENT DISTRESS  The history is provided by the patient and the EMS personnel.    Past Medical History  Diagnosis Date  . Hypertension   . Polymyalgia rheumatica ([place])   . Diverticulosis   . Thyroid disease   . Diarrhea   . Hypokalemia   . Colon cancer ([place]) 2001  . Skin cancer   . AAA (abdominal aortic aneurysm) ([facility])     4.6 X 4.9 cm  . Osteoporosis   . BPH (benign prostatic hyperplasia)   . Tinea unguium   . Temporal arteritis ([place])   . Obstructive jaundice     2013  . Pancreatitis     2013, 04/2013  . Inguinal hernia, left   . Hypothyroidism    Past Surgical History  Procedure Laterality Date  . Colon surgery  2001    right colectomy for colon cancer  . Eus  07/13/2011    Dr. Carol Ada, inadequate conscious sedation. limited views of pancreas. No evidence of choledocholithiasis, CBD 64mm.  . Cholecystectomy    . Ercp N/A 05/21/2013    Procedure: ENDOSCOPIC RETROGRADE CHOLANGIOPANCREATOGRAPHY (ERCP) Sphincterotomy and stone extraction;  Surgeon: Rogene Houston, MD;  Location: [facility];  Service: Endoscopy;  Laterality: N/A;  . Eye surgery      cataracts removed from both eyes   . Abdominal aortic  endovascular stent graft N/A 05/25/2014    Procedure: ABDOMINAL AORTIC ENDOVASCULAR STENT GRAFT;  Surgeon: Elam Dutch, MD;  Location: [facility];  Service: Vascular;  Laterality: N/A;   History reviewed. No pertinent family history. Social History  Substance Use Topics  . Smoking status: Never Smoker   . Smokeless tobacco: None  . Alcohol Use: No    Review of Systems  Unable to perform ROS: Acuity of condition      Allergies  Loratadine; Penicillins; Darvocet; Fish oil; and Lactose intolerance (gi)  Home Medications   Prior to Admission medications   Medication Sig Start Date End Date Taking? Authorizing Provider  acetaminophen (TYLENOL) 500 MG tablet Take 500 mg by mouth every 4 (four) hours as needed for mild pain.   Yes Historical Provider, MD  anti-nausea (EMETROL) solution Take 5 mLs by mouth every 15 (fifteen) minutes as needed for nausea or vomiting.   Yes Historical Provider, MD  benzonatate (TESSALON) 100 MG capsule Take 100 mg by mouth 3 (three) times daily as needed for cough.   Yes Historical Provider, MD  budesonide (PULMICORT) 180 MCG/ACT inhaler Inhale 1 puff into the lungs 2 (two) times daily.   Yes Historical Provider, MD  cholecalciferol (VITAMIN D) 400 UNITS TABS Take 400 Units by mouth 2 (two) times daily.   Yes Historical Provider, MD  cyanocobalamin (  V-R VITAMIN B-12) 500 MCG tablet Take 1 tablet (500 mcg total) by mouth daily. 09/23/13  Yes Lysbeth Penner, FNP  docusate sodium (COLACE) 100 MG capsule Take 100 mg by mouth daily.   Yes Historical Provider, MD  feeding supplement (ENSURE IMMUNE HEALTH) LIQD Take 237 mLs by mouth daily.    Yes Historical Provider, MD  ferrous sulfate 325 (65 FE) MG tablet Take 2 tablets (650 mg total) by mouth daily with breakfast. Patient taking differently: Take 325 mg by mouth 2 (two) times daily with a meal.  11/12/14  Yes Sharion Balloon, FNP  fexofenadine (ALLEGRA) 180 MG tablet Take 1 tablet (180 mg total) by mouth daily.  05/11/15  Yes Wardell Honour, MD  furosemide (LASIX) 20 MG tablet Take 20 mg by mouth every other day.    Yes Historical Provider, MD  levothyroxine (SYNTHROID, LEVOTHROID) 75 MCG tablet Take 1 tablet (75 mcg total) by mouth daily. 08/23/15  Yes Timmothy Euler, MD  lisinopril (PRINIVIL,ZESTRIL) 10 MG tablet Take 10 mg by mouth daily.   Yes Historical Provider, MD  loperamide (IMODIUM) 2 MG capsule Take 2 mg by mouth as needed for diarrhea or loose stools.   Yes Historical Provider, MD  Polyethyl Glycol-Propyl Glycol (SYSTANE) 0.4-0.3 % SOLN Apply 1 drop to eye 4 (four) times daily.   Yes Historical Provider, MD  predniSONE (DELTASONE) 1 MG tablet Take 1 tablet (1 mg total) by mouth daily with breakfast. 08/22/15  Yes Timmothy Euler, MD  promethazine (PHENERGAN) 12.5 MG tablet Take 12.5 mg by mouth every 8 (eight) hours as needed for nausea or vomiting.   Yes Historical Provider, MD  donepezil (ARICEPT) 5 MG tablet Take 1 tablet (5 mg total) by mouth at bedtime. 09/23/13   Lysbeth Penner, FNP  predniSONE (DELTASONE) 1 MG tablet Take 4 tablets (4 mg total) by mouth daily with breakfast. 08/22/15   Timmothy Euler, MD  predniSONE (DELTASONE) 1 MG tablet Take 3 tablets (3 mg total) by mouth daily with breakfast. 08/22/15   Timmothy Euler, MD  predniSONE (DELTASONE) 1 MG tablet Take 2 tablets (2 mg total) by mouth daily with breakfast. 08/22/15   Timmothy Euler, MD   BP 113/65 mmHg  Pulse 68  Resp 21  SpO2 98% Physical Exam  Constitutional: He appears distressed.  Thin, frail elderly man, moaning and in distress  HENT:  Head: Normocephalic and atraumatic.  dry mucous membranes  Eyes: Conjunctivae are normal. Pupils are equal, round, and reactive to light.  Neck: Neck supple.  Cardiovascular: Normal rate, regular rhythm and intact distal pulses.   Murmur heard.  Systolic murmur is present with a grade of 3/6  Pulmonary/Chest: Effort normal and breath sounds normal.  Abdominal:  Soft. Bowel sounds are normal. He exhibits no distension. There is tenderness.  Large left inguinal hernia, pt moaning and difficult to assess focal tenderness over hernia  Musculoskeletal: He exhibits tenderness. He exhibits no edema.  L leg externally rotated; L hand w/ ecchymoses near skin tear on thenar eminence w/ ttp, no obvious deformity; normal ROM R elbow  Neurological: He is alert.  Oriented to person, difficult to perform neuro exam due to patient distress  Skin: Skin is warm and dry.  Skin tears L hand on thumb and thenar eminence, R elbow  Psychiatric:  distressed  Nursing note and vitals reviewed.   ED Course  Procedures (including critical care time) Labs Review Labs Reviewed  BASIC METABOLIC PANEL -  Abnormal; Notable for the following:    Chloride 113 (*)    CO2 19 (*)    Glucose, Bld 117 (*)    BUN 27 (*)    Creatinine, Ser 1.31 (*)    GFR calc non Af Amer 44 (*)    GFR calc Af Amer 51 (*)    All other components within normal limits  CBC WITH DIFFERENTIAL/PLATELET - Abnormal; Notable for the following:    RBC 4.08 (*)    Hemoglobin 12.2 (*)    HCT 36.8 (*)    Platelets 149 (*)    All other components within normal limits    Imaging Review Dg Chest 1 View  12/04/2015  CLINICAL DATA:  82 year old male with fall EXAM: CHEST 1 VIEW COMPARISON:  Chest radiograph dated 05/25/2014 FINDINGS: Single-view of the chest demonstrates emphysematous changes of the lungs. No focal consolidation, pleural effusion, or pneumothorax. The cardiac silhouette is within normal limits. The aorta is tortuous. There is osteopenia with degenerative changes of the spine. No acute fracture. IMPRESSION: No active disease. Electronically Signed   By: Anner Crete M.D.   On: 12/04/2015 23:31   Ct Head Wo Contrast  12/04/2015  CLINICAL DATA:  Unwitnessed fall.  Left hip pain. EXAM: CT HEAD WITHOUT CONTRAST CT CERVICAL SPINE WITHOUT CONTRAST TECHNIQUE: Multidetector CT imaging of the head and  cervical spine was performed following the standard protocol without intravenous contrast. Multiplanar CT image reconstructions of the cervical spine were also generated. COMPARISON:  CT head 11/17/2008 FINDINGS: CT HEAD FINDINGS Diffuse cerebral atrophy. Low-attenuation changes throughout the deep white matter consistent small vessel ischemia. Mild ventricular dilatation consistent with central atrophy. Small hyperdense extra-axial lesion along the left side of the upper falx measuring about 9 mm diameter. This is unchanged since previous study and probably represents a small calcified meningioma. No mass effect or midline shift. No abnormal extra-axial fluid collections. Gray-white matter junctions are distinct. Basal cisterns are not effaced. No evidence of acute intracranial hemorrhage. No depressed skull fractures. Visualized paranasal sinuses and mastoid air cells are not opacified. Vascular calcifications. CT CERVICAL SPINE FINDINGS Normal alignment of the cervical spine. Degenerative changes throughout the cervical spine with narrowed interspaces and associated endplate hypertrophic changes. No vertebral compression deformities. No prevertebral soft tissue swelling. C1-2 articulation appears intact. No focal bone lesion or bone destruction. Vascular calcifications. The upper chest demonstrates apical pleural thickening and scarring with mild bronchiectasis. Secretions within the trachea. Dilated aortic arch with diameter of about 3.7 cm, incompletely included within the field of view. IMPRESSION: No acute intracranial abnormalities. Chronic atrophy and small vessel ischemic changes. Small left para fall seen calcified meningioma. Normal alignment of the cervical spine with diffuse degenerative changes. No acute displaced fractures identified. Electronically Signed   By: Lucienne Capers M.D.   On: 12/04/2015 23:44   Ct Cervical Spine Wo Contrast  12/04/2015  CLINICAL DATA:  Unwitnessed fall.  Left hip  pain. EXAM: CT HEAD WITHOUT CONTRAST CT CERVICAL SPINE WITHOUT CONTRAST TECHNIQUE: Multidetector CT imaging of the head and cervical spine was performed following the standard protocol without intravenous contrast. Multiplanar CT image reconstructions of the cervical spine were also generated. COMPARISON:  CT head 11/17/2008 FINDINGS: CT HEAD FINDINGS Diffuse cerebral atrophy. Low-attenuation changes throughout the deep white matter consistent small vessel ischemia. Mild ventricular dilatation consistent with central atrophy. Small hyperdense extra-axial lesion along the left side of the upper falx measuring about 9 mm diameter. This is unchanged since previous study and probably represents  a small calcified meningioma. No mass effect or midline shift. No abnormal extra-axial fluid collections. Gray-white matter junctions are distinct. Basal cisterns are not effaced. No evidence of acute intracranial hemorrhage. No depressed skull fractures. Visualized paranasal sinuses and mastoid air cells are not opacified. Vascular calcifications. CT CERVICAL SPINE FINDINGS Normal alignment of the cervical spine. Degenerative changes throughout the cervical spine with narrowed interspaces and associated endplate hypertrophic changes. No vertebral compression deformities. No prevertebral soft tissue swelling. C1-2 articulation appears intact. No focal bone lesion or bone destruction. Vascular calcifications. The upper chest demonstrates apical pleural thickening and scarring with mild bronchiectasis. Secretions within the trachea. Dilated aortic arch with diameter of about 3.7 cm, incompletely included within the field of view. IMPRESSION: No acute intracranial abnormalities. Chronic atrophy and small vessel ischemic changes. Small left para fall seen calcified meningioma. Normal alignment of the cervical spine with diffuse degenerative changes. No acute displaced fractures identified. Electronically Signed   By: Lucienne Capers  M.D.   On: 12/04/2015 23:44   Dg Hip Unilat With Pelvis 2-3 Views Left  12/04/2015  CLINICAL DATA:  82 year old male with fall and left hip pain. EXAM: DG HIP (WITH OR WITHOUT PELVIS) 2-3V LEFT COMPARISON:  CT of the abdomen pelvis dated 11/25/2014 FINDINGS: There is a mildly displaced transverse fracture of the left femoral neck. There is mild proximal migration and impaction of the femoral shaft. The head of the femur remains in anatomic alignment within the acetabulum. No other acute fracture identified. The bones are osteopenic. An aorta bi-iliac endovascular stent graft is partially visualized. Postsurgical changes of bowel resection with anastomotic suture in the right lower quadrant. Air within the urinary bladder likely introduced via catheterization. IMPRESSION: Mildly impacted fracture of the left femoral neck. Electronically Signed   By: Anner Crete M.D.   On: 12/04/2015 23:33   Dg Femur 1v Left  12/04/2015  CLINICAL DATA:  Left hip pain after fall in the shower. EXAM: LEFT FEMUR 1 VIEW COMPARISON:  None. FINDINGS: Views obtained including the mid and distal shaft of the left femur demonstrate no acute fracture or dislocation in the visualized bones. Degenerative changes in the left knee. Vascular calcifications. IMPRESSION: No fractures demonstrated in the visualized midshaft and distal left femur. Electronically Signed   By: Lucienne Capers M.D.   On: 12/04/2015 23:30   I have personally reviewed and evaluated these images and lab results as part of my medical decision-making.   EKG Interpretation None     Medications  Tdap (BOOSTRIX) injection 0.5 mL (not administered)  morphine 4 MG/ML injection 4 mg (not administered)  sodium chloride 0.9 % bolus 1,000 mL (not administered)    MDM   Final diagnoses:  Fall   Patient presents by EMS after a fall in his bathroom unwitnessed by staff. He was moaning and in distress due to pain on exam. Vital signs stable. He was  neurovascularly intact distally. Left leg externally rotated. He had a large inguinal hernia and it was difficult to assess whether he had tenderness of the hernia versus pain from his hip. Skin tears noted as above. Obtained plain films of hip and left hand as well as CT of head and neck to rule out acute injury. Unable to assess tetanus status therefore updated with Tdap and gave morphine and IV fluid bolus for AKI w/ Cr 1.3.   Plain films show impacted fracture of left femoral neck. I discussed with orthopedics, Dr. Alvan Dame, who will see pt in consultation. We  will hold pt NPO at [facility] pending medical clearance. Discussed admission with [facility], Dr. Loleta Books, and pt admitted for further care.  Sharlett Iles, MD 12/05/15 [phone]

## 2015-12-05 ENCOUNTER — Inpatient Hospital Stay (HOSPITAL_COMMUNITY): Payer: Medicare Other

## 2015-12-05 ENCOUNTER — Encounter (HOSPITAL_COMMUNITY)
Admission: EM | Disposition: A | Discharge status: Skilled Nursing Facility | Payer: Self-pay | Source: Home / Self Care | Attending: Internal Medicine

## 2015-12-05 ENCOUNTER — Encounter (HOSPITAL_COMMUNITY): Payer: Self-pay | Admitting: Family Medicine

## 2015-12-05 ENCOUNTER — Inpatient Hospital Stay (HOSPITAL_COMMUNITY): Payer: Medicare Other | Admitting: Certified Registered"

## 2015-12-05 DIAGNOSIS — Z85828 Personal history of other malignant neoplasm of skin: Secondary | ICD-10-CM | POA: Diagnosis not present

## 2015-12-05 DIAGNOSIS — W19XXXD Unspecified fall, subsequent encounter: Secondary | ICD-10-CM

## 2015-12-05 DIAGNOSIS — R338 Other retention of urine: Secondary | ICD-10-CM | POA: Diagnosis present

## 2015-12-05 DIAGNOSIS — Z66 Do not resuscitate: Secondary | ICD-10-CM | POA: Diagnosis present

## 2015-12-05 DIAGNOSIS — E43 Unspecified severe protein-calorie malnutrition: Secondary | ICD-10-CM | POA: Diagnosis present

## 2015-12-05 DIAGNOSIS — M810 Age-related osteoporosis without current pathological fracture: Secondary | ICD-10-CM | POA: Diagnosis present

## 2015-12-05 DIAGNOSIS — I714 Abdominal aortic aneurysm, without rupture: Secondary | ICD-10-CM | POA: Diagnosis not present

## 2015-12-05 DIAGNOSIS — W19XXXA Unspecified fall, initial encounter: Secondary | ICD-10-CM | POA: Insufficient documentation

## 2015-12-05 DIAGNOSIS — D649 Anemia, unspecified: Secondary | ICD-10-CM | POA: Diagnosis present

## 2015-12-05 DIAGNOSIS — E878 Other disorders of electrolyte and fluid balance, not elsewhere classified: Secondary | ICD-10-CM | POA: Diagnosis present

## 2015-12-05 DIAGNOSIS — K4090 Unilateral inguinal hernia, without obstruction or gangrene, not specified as recurrent: Secondary | ICD-10-CM | POA: Diagnosis present

## 2015-12-05 DIAGNOSIS — E031 Congenital hypothyroidism without goiter: Secondary | ICD-10-CM | POA: Diagnosis not present

## 2015-12-05 DIAGNOSIS — S72002A Fracture of unspecified part of neck of left femur, initial encounter for closed fracture: Secondary | ICD-10-CM | POA: Diagnosis present

## 2015-12-05 DIAGNOSIS — J449 Chronic obstructive pulmonary disease, unspecified: Secondary | ICD-10-CM | POA: Diagnosis present

## 2015-12-05 DIAGNOSIS — M353 Polymyalgia rheumatica: Secondary | ICD-10-CM | POA: Diagnosis present

## 2015-12-05 DIAGNOSIS — E872 Acidosis: Secondary | ICD-10-CM | POA: Diagnosis present

## 2015-12-05 DIAGNOSIS — N401 Enlarged prostate with lower urinary tract symptoms: Secondary | ICD-10-CM | POA: Diagnosis present

## 2015-12-05 DIAGNOSIS — Z885 Allergy status to narcotic agent status: Secondary | ICD-10-CM | POA: Diagnosis not present

## 2015-12-05 DIAGNOSIS — Z7952 Long term (current) use of systemic steroids: Secondary | ICD-10-CM | POA: Diagnosis not present

## 2015-12-05 DIAGNOSIS — W182XXA Fall in (into) shower or empty bathtub, initial encounter: Secondary | ICD-10-CM | POA: Diagnosis present

## 2015-12-05 DIAGNOSIS — Z01810 Encounter for preprocedural cardiovascular examination: Secondary | ICD-10-CM | POA: Diagnosis not present

## 2015-12-05 DIAGNOSIS — Z7951 Long term (current) use of inhaled steroids: Secondary | ICD-10-CM | POA: Diagnosis not present

## 2015-12-05 DIAGNOSIS — Z79899 Other long term (current) drug therapy: Secondary | ICD-10-CM | POA: Diagnosis not present

## 2015-12-05 DIAGNOSIS — I129 Hypertensive chronic kidney disease with stage 1 through stage 4 chronic kidney disease, or unspecified chronic kidney disease: Secondary | ICD-10-CM | POA: Diagnosis present

## 2015-12-05 DIAGNOSIS — Z91011 Allergy to milk products: Secondary | ICD-10-CM | POA: Diagnosis not present

## 2015-12-05 DIAGNOSIS — Z91013 Allergy to seafood: Secondary | ICD-10-CM | POA: Diagnosis not present

## 2015-12-05 DIAGNOSIS — J984 Other disorders of lung: Secondary | ICD-10-CM | POA: Diagnosis present

## 2015-12-05 DIAGNOSIS — E039 Hypothyroidism, unspecified: Secondary | ICD-10-CM | POA: Diagnosis present

## 2015-12-05 DIAGNOSIS — F0390 Unspecified dementia without behavioral disturbance: Secondary | ICD-10-CM | POA: Diagnosis present

## 2015-12-05 DIAGNOSIS — Z8679 Personal history of other diseases of the circulatory system: Secondary | ICD-10-CM | POA: Diagnosis not present

## 2015-12-05 DIAGNOSIS — I4581 Long QT syndrome: Secondary | ICD-10-CM | POA: Diagnosis present

## 2015-12-05 DIAGNOSIS — I10 Essential (primary) hypertension: Secondary | ICD-10-CM | POA: Diagnosis not present

## 2015-12-05 DIAGNOSIS — Z85038 Personal history of other malignant neoplasm of large intestine: Secondary | ICD-10-CM | POA: Diagnosis not present

## 2015-12-05 DIAGNOSIS — H9190 Unspecified hearing loss, unspecified ear: Secondary | ICD-10-CM | POA: Diagnosis present

## 2015-12-05 DIAGNOSIS — N183 Chronic kidney disease, stage 3 (moderate): Secondary | ICD-10-CM | POA: Diagnosis present

## 2015-12-05 DIAGNOSIS — G9340 Encephalopathy, unspecified: Secondary | ICD-10-CM | POA: Diagnosis not present

## 2015-12-05 DIAGNOSIS — Z880 Allergy status to penicillin: Secondary | ICD-10-CM | POA: Diagnosis not present

## 2015-12-05 HISTORY — PX: HIP ARTHROPLASTY: SHX981

## 2015-12-05 LAB — TYPE AND SCREEN
ABO/RH(D): A POS
ANTIBODY SCREEN: NEGATIVE

## 2015-12-05 LAB — BASIC METABOLIC PANEL
ANION GAP: 6 (ref 5–15)
BUN: 25 mg/dL — ABNORMAL HIGH (ref 6–20)
CHLORIDE: 117 mmol/L — AB (ref 101–111)
CO2: 15 mmol/L — AB (ref 22–32)
CREATININE: 1.26 mg/dL — AB (ref 0.61–1.24)
Calcium: 8.5 mg/dL — ABNORMAL LOW (ref 8.9–10.3)
GFR calc non Af Amer: 46 mL/min — ABNORMAL LOW (ref >60)
GFR, EST AFRICAN AMERICAN: 54 mL/min — AB (ref >60)
Glucose, Bld: 117 mg/dL — ABNORMAL HIGH (ref 65–99)
POTASSIUM: 4 mmol/L (ref 3.5–5.1)
SODIUM: 138 mmol/L (ref 135–145)

## 2015-12-05 LAB — URINALYSIS, ROUTINE W REFLEX MICROSCOPIC
BILIRUBIN URINE: NEGATIVE
Glucose, UA: NEGATIVE mg/dL
KETONES UR: NEGATIVE mg/dL
Leukocytes, UA: NEGATIVE
Nitrite: NEGATIVE
PH: 5 (ref 5.0–8.0)
Protein, ur: NEGATIVE mg/dL
SPECIFIC GRAVITY, URINE: 1.017 (ref 1.005–1.030)

## 2015-12-05 LAB — CBC
HEMATOCRIT: 34.9 % — AB (ref 39.0–52.0)
HEMOGLOBIN: 11.7 g/dL — AB (ref 13.0–17.0)
MCH: 30.3 pg (ref 26.0–34.0)
MCHC: 33.5 g/dL (ref 30.0–36.0)
MCV: 90.4 fL (ref 78.0–100.0)
Platelets: 132 10*3/uL — ABNORMAL LOW (ref 150–400)
RBC: 3.86 MIL/uL — AB (ref 4.22–5.81)
RDW: 13.4 % (ref 11.5–15.5)
WBC: 8.5 10*3/uL (ref 4.0–10.5)

## 2015-12-05 LAB — URINE MICROSCOPIC-ADD ON: Squamous Epithelial / LPF: NONE SEEN

## 2015-12-05 LAB — SURGICAL PCR SCREEN
MRSA, PCR: NEGATIVE
Staphylococcus aureus: NEGATIVE

## 2015-12-05 LAB — CREATININE, SERUM
CREATININE: 1.03 mg/dL (ref 0.61–1.24)
GFR calc Af Amer: >60 mL/min (ref >60)
GFR calc non Af Amer: 59 mL/min — ABNORMAL LOW (ref >60)

## 2015-12-05 LAB — TROPONIN I: Troponin I: 0.05 ng/mL (ref <0.03)

## 2015-12-05 SURGERY — HEMIARTHROPLASTY, HIP, DIRECT ANTERIOR APPROACH, FOR FRACTURE
Anesthesia: General | Site: Hip | Laterality: Left

## 2015-12-05 MED ORDER — MORPHINE SULFATE (PF) 2 MG/ML IV SOLN
0.5000 mg | INTRAVENOUS | Status: DC | PRN
  Administered 2015-12-07: 0.5 mg via INTRAVENOUS
  Filled 2015-12-05 (×2): qty 1

## 2015-12-05 MED ORDER — SODIUM CHLORIDE 0.9 % IR SOLN
Status: DC | PRN
  Administered 2015-12-05: 1000 mL

## 2015-12-05 MED ORDER — ENSURE ENLIVE PO LIQD: 237.0000 mL | Freq: Every day | ORAL | Status: DC

## 2015-12-05 MED ORDER — PHENOL 1.4 % MT LIQD
1.0000 | OROMUCOSAL | Status: DC | PRN
Start: 2015-12-05 — End: 2015-12-08

## 2015-12-05 MED ORDER — DOCUSATE SODIUM 100 MG PO CAPS
100.0000 mg | ORAL_CAPSULE | Freq: Two times a day (BID) | ORAL | Status: DC
  Administered 2015-12-05 – 2015-12-07 (×2): 100 mg via ORAL
  Filled 2015-12-05 (×4): qty 1

## 2015-12-05 MED ORDER — POLYETHYLENE GLYCOL 3350 17 G PO PACK: 17.0000 g | PACK | Freq: Every day | ORAL | Status: DC | PRN

## 2015-12-05 MED ORDER — SODIUM CHLORIDE 0.9 % IV SOLN
INTRAVENOUS | Status: DC
  Administered 2015-12-05 – 2015-12-06 (×4): via INTRAVENOUS

## 2015-12-05 MED ORDER — FENTANYL CITRATE (PF) 250 MCG/5ML IJ SOLN
INTRAMUSCULAR | Status: DC | PRN
  Administered 2015-12-05: 50 ug via INTRAVENOUS
  Administered 2015-12-05 (×2): 25 ug via INTRAVENOUS

## 2015-12-05 MED ORDER — VANCOMYCIN HCL IN DEXTROSE 1-5 GM/200ML-% IV SOLN
1000.0000 mg | Freq: Two times a day (BID) | INTRAVENOUS | Status: AC
  Administered 2015-12-06: 1000 mg via INTRAVENOUS
  Filled 2015-12-05: qty 200

## 2015-12-05 MED ORDER — ENSURE ENLIVE PO LIQD
237.0000 mL | Freq: Two times a day (BID) | ORAL | Status: DC
  Administered 2015-12-06 – 2015-12-08 (×3): 237 mL via ORAL

## 2015-12-05 MED ORDER — PROMETHAZINE HCL 25 MG/ML IJ SOLN
6.2500 mg | INTRAMUSCULAR | Status: DC | PRN
Start: 2015-12-05 — End: 2015-12-05

## 2015-12-05 MED ORDER — ENOXAPARIN SODIUM 40 MG/0.4ML ~~LOC~~ SOLN: 40.0000 mg | SUBCUTANEOUS | Status: DC

## 2015-12-05 MED ORDER — PHENYLEPHRINE HCL 10 MG/ML IJ SOLN
10.0000 mg | INTRAVENOUS | Status: DC | PRN
  Administered 2015-12-05: 150 ug/min via INTRAVENOUS
  Administered 2015-12-05: 100 ug/min via INTRAVENOUS

## 2015-12-05 MED ORDER — EPHEDRINE SULFATE-NACL 50-0.9 MG/10ML-% IV SOSY
PREFILLED_SYRINGE | INTRAVENOUS | Status: DC | PRN
  Administered 2015-12-05: 10 mg via INTRAVENOUS

## 2015-12-05 MED ORDER — LIDOCAINE 2% (20 MG/ML) 5 ML SYRINGE
INTRAMUSCULAR | Status: DC | PRN
  Administered 2015-12-05: 60 mg via INTRAVENOUS

## 2015-12-05 MED ORDER — ONDANSETRON HCL 4 MG/2ML IJ SOLN
INTRAMUSCULAR | Status: AC
  Filled 2015-12-05: qty 2

## 2015-12-05 MED ORDER — ONDANSETRON HCL 4 MG/2ML IJ SOLN
INTRAMUSCULAR | Status: DC | PRN
  Administered 2015-12-05: 4 mg via INTRAVENOUS

## 2015-12-05 MED ORDER — ROCURONIUM BROMIDE 100 MG/10ML IV SOLN
INTRAVENOUS | Status: DC | PRN
  Administered 2015-12-05: 10 mg via INTRAVENOUS

## 2015-12-05 MED ORDER — PROPOFOL 10 MG/ML IV BOLUS
INTRAVENOUS | Status: DC | PRN
  Administered 2015-12-05: 80 mg via INTRAVENOUS

## 2015-12-05 MED ORDER — ROCURONIUM BROMIDE 50 MG/5ML IV SOLN
INTRAVENOUS | Status: AC
  Filled 2015-12-05: qty 1

## 2015-12-05 MED ORDER — ENOXAPARIN SODIUM 30 MG/0.3ML ~~LOC~~ SOLN
30.0000 mg | SUBCUTANEOUS | Status: DC
  Administered 2015-12-06 – 2015-12-08 (×3): 30 mg via SUBCUTANEOUS
  Filled 2015-12-05 (×3): qty 0.3

## 2015-12-05 MED ORDER — DOCUSATE SODIUM 100 MG PO CAPS
100.0000 mg | ORAL_CAPSULE | Freq: Every day | ORAL | Status: DC
Start: 2015-12-05 — End: 2015-12-05

## 2015-12-05 MED ORDER — ONDANSETRON HCL 4 MG/2ML IJ SOLN: 4.0000 mg | Freq: Four times a day (QID) | INTRAMUSCULAR | Status: DC | PRN

## 2015-12-05 MED ORDER — SUGAMMADEX SODIUM 200 MG/2ML IV SOLN
INTRAVENOUS | Status: AC
  Filled 2015-12-05: qty 2

## 2015-12-05 MED ORDER — BUDESONIDE 0.25 MG/2ML IN SUSP
0.2500 mg | Freq: Two times a day (BID) | RESPIRATORY_TRACT | Status: DC
  Administered 2015-12-06 – 2015-12-08 (×5): 0.25 mg via RESPIRATORY_TRACT
  Filled 2015-12-05 (×6): qty 2

## 2015-12-05 MED ORDER — VANCOMYCIN HCL IN DEXTROSE 1-5 GM/200ML-% IV SOLN
INTRAVENOUS | Status: AC
  Administered 2015-12-05: 1000 mg via INTRAVENOUS
  Filled 2015-12-05: qty 200

## 2015-12-05 MED ORDER — HYDROMORPHONE HCL 1 MG/ML IJ SOLN: 0.2500 mg | INTRAMUSCULAR | Status: DC | PRN

## 2015-12-05 MED ORDER — MENTHOL 3 MG MT LOZG: 1.0000 | LOZENGE | OROMUCOSAL | Status: DC | PRN

## 2015-12-05 MED ORDER — PREDNISONE 1 MG PO TABS
1.0000 mg | ORAL_TABLET | Freq: Every day | ORAL | Status: DC
  Administered 2015-12-05 – 2015-12-08 (×4): 1 mg via ORAL
  Filled 2015-12-05 (×4): qty 1

## 2015-12-05 MED ORDER — HYDROCODONE-ACETAMINOPHEN 5-325 MG PO TABS
1.0000 | ORAL_TABLET | Freq: Four times a day (QID) | ORAL | Status: DC | PRN
  Administered 2015-12-05: 1 via ORAL
  Administered 2015-12-06: 2 via ORAL
  Administered 2015-12-06 – 2015-12-07 (×2): 1 via ORAL
  Filled 2015-12-05: qty 1
  Filled 2015-12-05: qty 2
  Filled 2015-12-05 (×2): qty 1

## 2015-12-05 MED ORDER — FENTANYL CITRATE (PF) 250 MCG/5ML IJ SOLN
INTRAMUSCULAR | Status: AC
  Filled 2015-12-05: qty 5

## 2015-12-05 MED ORDER — ONDANSETRON HCL 4 MG PO TABS: 4.0000 mg | ORAL_TABLET | Freq: Four times a day (QID) | ORAL | Status: DC | PRN

## 2015-12-05 MED ORDER — HYDROCODONE-ACETAMINOPHEN 5-325 MG PO TABS: 1.0000 | ORAL_TABLET | Freq: Four times a day (QID) | ORAL | Status: AC | PRN

## 2015-12-05 MED ORDER — LEVOTHYROXINE SODIUM 75 MCG PO TABS
75.0000 ug | ORAL_TABLET | Freq: Every day | ORAL | Status: DC
  Administered 2015-12-05 – 2015-12-08 (×4): 75 ug via ORAL
  Filled 2015-12-05 (×4): qty 1

## 2015-12-05 MED ORDER — BISACODYL 10 MG RE SUPP: 10.0000 mg | Freq: Every day | RECTAL | Status: DC | PRN

## 2015-12-05 MED ORDER — SUCCINYLCHOLINE CHLORIDE 200 MG/10ML IV SOSY
PREFILLED_SYRINGE | INTRAVENOUS | Status: DC | PRN
  Administered 2015-12-05: 100 mg via INTRAVENOUS

## 2015-12-05 MED ORDER — LACTATED RINGERS IV SOLN
INTRAVENOUS | Status: DC | PRN
  Administered 2015-12-05: 20:00:00 via INTRAVENOUS

## 2015-12-05 MED ORDER — METOCLOPRAMIDE HCL 5 MG/ML IJ SOLN
5.0000 mg | Freq: Three times a day (TID) | INTRAMUSCULAR | Status: DC | PRN
Start: 2015-12-05 — End: 2015-12-08

## 2015-12-05 MED ORDER — METOCLOPRAMIDE HCL 5 MG PO TABS: 5.0000 mg | ORAL_TABLET | Freq: Three times a day (TID) | ORAL | Status: DC | PRN

## 2015-12-05 MED ORDER — PROPOFOL 10 MG/ML IV BOLUS
INTRAVENOUS | Status: AC
  Filled 2015-12-05: qty 20

## 2015-12-05 MED ORDER — FERROUS SULFATE 325 (65 FE) MG PO TABS
325.0000 mg | ORAL_TABLET | Freq: Two times a day (BID) | ORAL | Status: DC
  Administered 2015-12-06 – 2015-12-08 (×4): 325 mg via ORAL
  Filled 2015-12-05 (×4): qty 1

## 2015-12-05 SURGICAL SUPPLY — 47 items
BLADE SAW SGTL 18X1.27X75 (BLADE) ×2 IMPLANT
BLADE SAW SGTL 18X1.27X75MM (BLADE) ×1
CAPT HIP HEMI 1 ×3 IMPLANT
COVER SURGICAL LIGHT HANDLE (MISCELLANEOUS) ×3 IMPLANT
DERMABOND ADVANCED (GAUZE/BANDAGES/DRESSINGS) ×2
DERMABOND ADVANCED .7 DNX12 (GAUZE/BANDAGES/DRESSINGS) ×1 IMPLANT
DRAPE IMP U-DRAPE 54X76 (DRAPES) ×3 IMPLANT
DRAPE INCISE IOBAN 85X60 (DRAPES) ×3 IMPLANT
DRAPE ORTHO SPLIT 77X108 STRL (DRAPES) ×4
DRAPE SURG ORHT 6 SPLT 77X108 (DRAPES) ×2 IMPLANT
DRAPE U-SHAPE 47X51 STRL (DRAPES) ×3 IMPLANT
DRESSING AQUACEL AG SP 3.5X10 (GAUZE/BANDAGES/DRESSINGS) ×1 IMPLANT
DRSG AQUACEL AG ADV 3.5X10 (GAUZE/BANDAGES/DRESSINGS) ×3 IMPLANT
DRSG AQUACEL AG SP 3.5X10 (GAUZE/BANDAGES/DRESSINGS) ×3
DURAPREP 26ML APPLICATOR (WOUND CARE) ×3 IMPLANT
ELECT BLADE 4.0 EZ CLEAN MEGAD (MISCELLANEOUS) ×3
ELECT REM PT RETURN 9FT ADLT (ELECTROSURGICAL) ×3
ELECTRODE BLDE 4.0 EZ CLN MEGD (MISCELLANEOUS) ×1 IMPLANT
ELECTRODE REM PT RTRN 9FT ADLT (ELECTROSURGICAL) ×1 IMPLANT
FACESHIELD WRAPAROUND (MASK) ×3 IMPLANT
GLOVE BIOGEL PI IND STRL 7.5 (GLOVE) ×1 IMPLANT
GLOVE BIOGEL PI IND STRL 8 (GLOVE) ×2 IMPLANT
GLOVE BIOGEL PI INDICATOR 7.5 (GLOVE) ×2
GLOVE BIOGEL PI INDICATOR 8 (GLOVE) ×4
GLOVE ORTHO TXT STRL SZ7.5 (GLOVE) ×3 IMPLANT
GLOVE SURG ORTHO 8.0 STRL STRW (GLOVE) ×3 IMPLANT
GOWN STRL REUS W/ TWL LRG LVL3 (GOWN DISPOSABLE) ×2 IMPLANT
GOWN STRL REUS W/TWL LRG LVL3 (GOWN DISPOSABLE) ×4
HOOD PEEL AWAY FLYTE STAYCOOL (MISCELLANEOUS) ×3 IMPLANT
KIT BASIN OR (CUSTOM PROCEDURE TRAY) ×3 IMPLANT
KIT ROOM TURNOVER OR (KITS) ×3 IMPLANT
NS IRRIG 1000ML POUR BTL (IV SOLUTION) ×3 IMPLANT
PACK TOTAL JOINT (CUSTOM PROCEDURE TRAY) ×3 IMPLANT
PACK UNIVERSAL I (CUSTOM PROCEDURE TRAY) ×3 IMPLANT
PAD ARMBOARD 7.5X6 YLW CONV (MISCELLANEOUS) ×6 IMPLANT
SPONGE LAP 4X18 X RAY DECT (DISPOSABLE) ×3 IMPLANT
SUT MNCRL AB 4-0 PS2 18 (SUTURE) ×3 IMPLANT
SUT VIC AB 1 CT1 27 (SUTURE) ×4
SUT VIC AB 1 CT1 27XBRD ANBCTR (SUTURE) ×2 IMPLANT
SUT VIC AB 2-0 CT1 27 (SUTURE) ×2
SUT VIC AB 2-0 CT1 TAPERPNT 27 (SUTURE) ×1 IMPLANT
TOWEL OR 17X24 6PK STRL BLUE (TOWEL DISPOSABLE) ×3 IMPLANT
TOWEL OR 17X26 10 PK STRL BLUE (TOWEL DISPOSABLE) ×3 IMPLANT
TRAY FOLEY CATH 14FR (SET/KITS/TRAYS/PACK) IMPLANT
TRAY FOLEY CATH SILVER 14FR (SET/KITS/TRAYS/PACK) ×3 IMPLANT
WATER STERILE IRR 1000ML POUR (IV SOLUTION) ×6 IMPLANT
YANKAUER SUCT BULB TIP NO VENT (SUCTIONS) ×3 IMPLANT

## 2015-12-05 NOTE — Progress Notes (Signed)
Call returned from [facility] with no changes in orders.

## 2015-12-05 NOTE — Transfer of Care (Signed)
Immediate Anesthesia Transfer of Care Note  Patient: Corey Walker  Procedure(s) Performed: Procedure(s): ARTHROPLASTY BIPOLAR HIP (HEMIARTHROPLASTY) (Left)  Patient Location: PACU  Anesthesia Type:General  Level of Consciousness: awake  Airway & Oxygen Therapy: Patient Spontanous Breathing and Patient connected to face mask oxygen  Post-op Assessment: Report given to RN and Post -op Vital signs reviewed and stable  Post vital signs: Reviewed and stable  Last Vitals:  Filed Vitals:   12/05/15 1650 12/05/15 1910  BP: 114/52 125/51  Pulse: 73 72  Temp: 36.6 C 37.1 C  Resp: 16 16    Last Pain:  Filed Vitals:   12/05/15 1924  PainSc: 2          Complications: No apparent anesthesia complications

## 2015-12-05 NOTE — ED Notes (Signed)
IV insertion unsuccessful

## 2015-12-05 NOTE — Anesthesia Postprocedure Evaluation (Signed)
Anesthesia Post Note  Patient: Corey Walker  Procedure(s) Performed: Procedure(s) (LRB): ARTHROPLASTY BIPOLAR HIP (HEMIARTHROPLASTY) (Left)  Patient location during evaluation: PACU Anesthesia Type: General Level of consciousness: sedated Pain management: pain level controlled Vital Signs Assessment: post-procedure vital signs reviewed and stable Respiratory status: spontaneous breathing and respiratory function stable Cardiovascular status: stable Anesthetic complications: no    Last Vitals:  Filed Vitals:   12/05/15 2145 12/05/15 2200  BP: 107/91 112/66  Pulse: 111 102  Temp:    Resp: 15 18    Last Pain:  Filed Vitals:   12/05/15 2201  PainSc: 0-No pain                 [name]

## 2015-12-05 NOTE — Progress Notes (Signed)
Troponin of 0.05 reported to on-call PA, Rosaria Ferries. Waiting on further instructions.

## 2015-12-05 NOTE — Progress Notes (Signed)
[facility]                                                                              Patient Demographics  Corey Walker, is a 82 y.o. male, DOB - 09/29/1917, [id number]  Admit date - 12/04/2015   Admitting Physician Edwin Dada, MD  Outpatient Primary MD for the patient is Kenn File, MD  Outpatient specialists:   LOS - 0  days    Chief Complaint  Patient presents with  . Hip Injury       Brief summary  Patient is a 82 year old male with history significant for AAA s/p repair in 2015, hypothyroidism, PMR on prednisone 1 mg/day, large left inguinal hernia and CKD who presents with hip pain after a fall. The patient was in his normal state of health until this evening when he was walking in the bathroom, suddenly "just fell" and had immediate severe LEFT hip pain and could not walk.There was no preceding weakness, chest discomfort, palpitations, or malaise, nor are there any of those symptoms now.The patient has no active heart disease, angina, or history of MI. He walks with a four wheeled walker, three times a day to the cafeteria but is otherwise sedentary and cannot exert to an equivalent of 4 METS.     Assessment & Plan    Principal Problem:   Closed left hip fracture ([place]) - Ortho consulted in ED, The patient will be seen by Dr. Alvan Dame - cont pain control, gentle hydration - Overall, the patient is at higher than average risk for the planned surgery given age and poor baseline functional status.Cardiology consulted for preop, vascular disease, AAA status post repair in 04/2014. No known history of CAD.  COPD  - Currently stable, no wheezing     Hypothyroidism: -Continue levothyroxine   PMR: Tapering off 2 years prednisone this spring.  -Continue prednisone 1 mg daily (to be finished 7/27) -Monitor for hypotension post-operatively   Inguinal hernia: Chronic, not incarcerated clinically   CKD with hyperchloremic metabolic  acidosis: Creatinine slightly above baseline 1.1 mg/dL, BUN elevated. -Trend BMP post-operatively - Gentle hydration   Chronic normocytic anemia: Currently at baseline, monitor H&H   Severe end-stage protein calorie malnutrition: -Continue Ensure  Essential hypertension: -Hold lisinopril and furosemide pre-operatively, restart after -Furosemide is every other day  Prolonged QTc: -Repeat ECG for better baseline  Code Status: DO NOT RESUSCITATE  DVT Prophylaxis:  SCD's Family Communication: Discussed in detail with the patient, all imaging results, lab results explained to the patient   Disposition Plan: Pending orthopedic evaluation  Time Spent in minutes   25 minutes  Procedures:  None   Consultants:   Ortho cardiology  Antimicrobials:      Medications  Scheduled Meds: . budesonide  0.25 mg Inhalation BID  . docusate sodium  100 mg Oral BID  . feeding supplement (ENSURE ENLIVE)  237 mL Oral Daily  . ferrous sulfate  325 mg Oral BID WC  . levothyroxine  75 mcg Oral QAC breakfast  . predniSONE  1 mg Oral Q breakfast   Continuous Infusions: . sodium chloride 100 mL/hr at 12/05/15 0141  PRN Meds:.bisacodyl, HYDROcodone-acetaminophen, morphine injection, polyethylene glycol   Antibiotics   Anti-infectives    None        Subjective:   Corey Walker was seen and examined today.  Having pain in the left hip, appears to be somewhat confused. No fevers or chills.   Objective:   Filed Vitals:   12/04/15 2304 12/05/15 0107 12/05/15 0454  BP: 113/65 117/52 118/59  Pulse: 68 100 107  Temp:  98.2 F (36.8 C) 98.1 F (36.7 C)  TempSrc:  Oral Oral  Resp: 21 20 18   SpO2: 98% 98% 100%   No intake or output data in the 24 hours ending 12/05/15 1137   Wt Readings from Last 3 Encounters:  08/22/15 53.434 kg (117 lb 12.8 oz)  07/19/15 49.896 kg (110 lb)  05/11/15 51.313 kg (113 lb 2 oz)     Exam  General: Alert and oriented x 2  HEENT:     Neck:   Cardiovascular: S1 S2 auscultated, no rubs, murmurs or gallops. Regular rate and rhythm.  Respiratory: Clear to auscultation bilaterally, no wheezing, rales or rhonchi  Gastrointestinal: Soft, nontender, nondistended, + bowel sounds  Ext: no cyanosis clubbing or edema  Neuro:   Skin: No rashes  Psych: slightly confused   Data Reviewed:  I have personally reviewed following labs and imaging studies  Micro Results Recent Results (from the past 240 hour(s))  Surgical PCR screen     Status: None   Collection Time: 12/05/15  2:02 AM  Result Value Ref Range Status   MRSA, PCR NEGATIVE NEGATIVE Final   Staphylococcus aureus NEGATIVE NEGATIVE Final    Comment:        The Xpert SA Assay (FDA approved for NASAL specimens in patients over 68 years of age), is one component of a comprehensive surveillance program.  Test performance has been validated by [facility] for patients greater than or equal to 3 year old. It is not intended to diagnose infection nor to guide or monitor treatment.     Radiology Reports Dg Chest 1 View  12/04/2015  CLINICAL DATA:  82 year old male with fall EXAM: CHEST 1 VIEW COMPARISON:  Chest radiograph dated 05/25/2014 FINDINGS: Single-view of the chest demonstrates emphysematous changes of the lungs. No focal consolidation, pleural effusion, or pneumothorax. The cardiac silhouette is within normal limits. The aorta is tortuous. There is osteopenia with degenerative changes of the spine. No acute fracture. IMPRESSION: No active disease. Electronically Signed   By: Anner Crete M.D.   On: 12/04/2015 23:31   Ct Head Wo Contrast  12/04/2015  CLINICAL DATA:  Unwitnessed fall.  Left hip pain. EXAM: CT HEAD WITHOUT CONTRAST CT CERVICAL SPINE WITHOUT CONTRAST TECHNIQUE: Multidetector CT imaging of the head and cervical spine was performed following the standard protocol without intravenous contrast. Multiplanar CT image reconstructions of the  cervical spine were also generated. COMPARISON:  CT head 11/17/2008 FINDINGS: CT HEAD FINDINGS Diffuse cerebral atrophy. Low-attenuation changes throughout the deep white matter consistent small vessel ischemia. Mild ventricular dilatation consistent with central atrophy. Small hyperdense extra-axial lesion along the left side of the upper falx measuring about 9 mm diameter. This is unchanged since previous study and probably represents a small calcified meningioma. No mass effect or midline shift. No abnormal extra-axial fluid collections. Gray-white matter junctions are distinct. Basal cisterns are not effaced. No evidence of acute intracranial hemorrhage. No depressed skull fractures. Visualized paranasal sinuses and mastoid air cells are not opacified. Vascular calcifications. CT CERVICAL SPINE FINDINGS  Normal alignment of the cervical spine. Degenerative changes throughout the cervical spine with narrowed interspaces and associated endplate hypertrophic changes. No vertebral compression deformities. No prevertebral soft tissue swelling. C1-2 articulation appears intact. No focal bone lesion or bone destruction. Vascular calcifications. The upper chest demonstrates apical pleural thickening and scarring with mild bronchiectasis. Secretions within the trachea. Dilated aortic arch with diameter of about 3.7 cm, incompletely included within the field of view. IMPRESSION: No acute intracranial abnormalities. Chronic atrophy and small vessel ischemic changes. Small left para fall seen calcified meningioma. Normal alignment of the cervical spine with diffuse degenerative changes. No acute displaced fractures identified. Electronically Signed   By: Lucienne Capers M.D.   On: 12/04/2015 23:44   Ct Cervical Spine Wo Contrast  12/04/2015  CLINICAL DATA:  Unwitnessed fall.  Left hip pain. EXAM: CT HEAD WITHOUT CONTRAST CT CERVICAL SPINE WITHOUT CONTRAST TECHNIQUE: Multidetector CT imaging of the head and cervical spine  was performed following the standard protocol without intravenous contrast. Multiplanar CT image reconstructions of the cervical spine were also generated. COMPARISON:  CT head 11/17/2008 FINDINGS: CT HEAD FINDINGS Diffuse cerebral atrophy. Low-attenuation changes throughout the deep white matter consistent small vessel ischemia. Mild ventricular dilatation consistent with central atrophy. Small hyperdense extra-axial lesion along the left side of the upper falx measuring about 9 mm diameter. This is unchanged since previous study and probably represents a small calcified meningioma. No mass effect or midline shift. No abnormal extra-axial fluid collections. Gray-white matter junctions are distinct. Basal cisterns are not effaced. No evidence of acute intracranial hemorrhage. No depressed skull fractures. Visualized paranasal sinuses and mastoid air cells are not opacified. Vascular calcifications. CT CERVICAL SPINE FINDINGS Normal alignment of the cervical spine. Degenerative changes throughout the cervical spine with narrowed interspaces and associated endplate hypertrophic changes. No vertebral compression deformities. No prevertebral soft tissue swelling. C1-2 articulation appears intact. No focal bone lesion or bone destruction. Vascular calcifications. The upper chest demonstrates apical pleural thickening and scarring with mild bronchiectasis. Secretions within the trachea. Dilated aortic arch with diameter of about 3.7 cm, incompletely included within the field of view. IMPRESSION: No acute intracranial abnormalities. Chronic atrophy and small vessel ischemic changes. Small left para fall seen calcified meningioma. Normal alignment of the cervical spine with diffuse degenerative changes. No acute displaced fractures identified. Electronically Signed   By: Lucienne Capers M.D.   On: 12/04/2015 23:44   Dg Hand Complete Left  12/05/2015  CLINICAL DATA:  Ecchymosis and skin tears on the thumb after a fall.  EXAM: LEFT HAND - COMPLETE 3+ VIEW COMPARISON:  None. FINDINGS: Degenerative changes in the interphalangeal joints, first carpometacarpal and metacarpal phalangeal joints, STT joints, and radiocarpal joints of the left hand and wrist. No evidence of acute fracture or dislocation. No soft tissue foreign bodies. IMPRESSION: Diffuse degenerative changes in the left hand and wrist. Electronically Signed   By: Lucienne Capers M.D.   On: 12/05/2015 00:32   Dg Hip Unilat With Pelvis 2-3 Views Left  12/04/2015  CLINICAL DATA:  82 year old male with fall and left hip pain. EXAM: DG HIP (WITH OR WITHOUT PELVIS) 2-3V LEFT COMPARISON:  CT of the abdomen pelvis dated 11/25/2014 FINDINGS: There is a mildly displaced transverse fracture of the left femoral neck. There is mild proximal migration and impaction of the femoral shaft. The head of the femur remains in anatomic alignment within the acetabulum. No other acute fracture identified. The bones are osteopenic. An aorta bi-iliac endovascular stent graft is partially visualized.  Postsurgical changes of bowel resection with anastomotic suture in the right lower quadrant. Air within the urinary bladder likely introduced via catheterization. IMPRESSION: Mildly impacted fracture of the left femoral neck. Electronically Signed   By: Anner Crete M.D.   On: 12/04/2015 23:33   Dg Femur 1v Left  12/04/2015  CLINICAL DATA:  Left hip pain after fall in the shower. EXAM: LEFT FEMUR 1 VIEW COMPARISON:  None. FINDINGS: Views obtained including the mid and distal shaft of the left femur demonstrate no acute fracture or dislocation in the visualized bones. Degenerative changes in the left knee. Vascular calcifications. IMPRESSION: No fractures demonstrated in the visualized midshaft and distal left femur. Electronically Signed   By: Lucienne Capers M.D.   On: 12/04/2015 23:30    Lab Data:  CBC:  Recent Labs Lab 12/04/15 2215 12/05/15 0146  WBC 9.2 8.5  NEUTROABS 7.0  --    HGB 12.2* 11.7*  HCT 36.8* 34.9*  MCV 90.2 90.4  PLT 149* [id number]*   Basic Metabolic Panel:  Recent Labs Lab 12/04/15 2215 12/05/15 0146  NA 139 138  K 4.0 4.0  CL 113* 117*  CO2 19* 15*  GLUCOSE 117* 117*  BUN 27* 25*  CREATININE 1.31* 1.26*  CALCIUM 9.0 8.5*   GFR: CrCl cannot be calculated (Unknown ideal weight.). Liver Function Tests: No results for input(s): AST, ALT, ALKPHOS, BILITOT, PROT, ALBUMIN in the last 168 hours. No results for input(s): LIPASE, AMYLASE in the last 168 hours. No results for input(s): AMMONIA in the last 168 hours. Coagulation Profile: No results for input(s): INR, PROTIME in the last 168 hours. Cardiac Enzymes: No results for input(s): CKTOTAL, CKMB, CKMBINDEX, TROPONINI in the last 168 hours. BNP (last 3 results) No results for input(s): PROBNP in the last 8760 hours. HbA1C: No results for input(s): HGBA1C in the last 72 hours. CBG: No results for input(s): GLUCAP in the last 168 hours. Lipid Profile: No results for input(s): CHOL, HDL, LDLCALC, TRIG, CHOLHDL, LDLDIRECT in the last 72 hours. Thyroid Function Tests: No results for input(s): TSH, T4TOTAL, FREET4, T3FREE, THYROIDAB in the last 72 hours. Anemia Panel: No results for input(s): VITAMINB12, FOLATE, FERRITIN, TIBC, IRON, RETICCTPCT in the last 72 hours. Urine analysis:    Component Value Date/Time   COLORURINE YELLOW 05/25/2014 1500   APPEARANCEUR CLEAR 05/25/2014 1500   LABSPEC 1.031* 05/25/2014 1500   PHURINE 5.5 05/25/2014 1500   GLUCOSEU NEGATIVE 05/25/2014 1500   HGBUR MODERATE* 05/25/2014 1500   BILIRUBINUR NEGATIVE 05/25/2014 1500   KETONESUR NEGATIVE 05/25/2014 1500   PROTEINUR NEGATIVE 05/25/2014 1500   UROBILINOGEN 0.2 05/25/2014 1500   NITRITE NEGATIVE 05/25/2014 1500   LEUKOCYTESUR SMALL* 05/25/2014 1500     RAI,RIPUDEEP M.D. [facility] 12/05/2015, 11:37 AM  Pager: [phone] Between 7am to 7pm - call Pager - [phone]  After 7pm go to  [email or web address] - password [email or web address]  Call night coverage person covering after 7pm

## 2015-12-05 NOTE — Consult Note (Signed)
Reason for Consult: Left hip fracture Referring Physician: Tana Coast, MD (Hospitalist service)  Corey Walker is an 82 y.o. male.    HPI: Corey Walker is a 82 y.o. male with a past medical history significant for AAA s/p repair in 2015, hypothyroidism, PMR on prednisone 1 mg/day, large left inguinal hernia and CKD who presents with hip pain after a fall.  The patient was in his normal state of health until this evening when he was walking in the bathroom, suddenly "just fell" and had immediate severe LEFT hip pain and could not walk. There was no preceding weakness, chest discomfort, palpitations, or malaise, nor are there any of those symptoms now.  The patient has no active heart disease, angina, or history of MI. He walks with a four wheeled walker, three times a day to the cafeteria but is otherwise sedentary  Past Medical History  Diagnosis Date  . Hypertension   . Polymyalgia rheumatica ([place])   . Diverticulosis   . Thyroid disease   . Diarrhea   . Hypokalemia   . Colon cancer ([place]) 2001  . Skin cancer   . AAA (abdominal aortic aneurysm) ([facility])     5.5 x 5.5 cm in 2015  . Osteoporosis   . BPH (benign prostatic hyperplasia)   . Tinea unguium   . Temporal arteritis ([place])   . Obstructive jaundice     2013  . Pancreatitis     2013, 04/2013  . Inguinal hernia, left   . Hypothyroidism     Past Surgical History  Procedure Laterality Date  . Colon surgery  2001    right colectomy for colon cancer  . Eus  07/13/2011    Dr. Carol Ada, inadequate conscious sedation. limited views of pancreas. No evidence of choledocholithiasis, CBD 28mm.  . Cholecystectomy    . Ercp N/A 05/21/2013    Procedure: ENDOSCOPIC RETROGRADE CHOLANGIOPANCREATOGRAPHY (ERCP) Sphincterotomy and stone extraction;  Surgeon: Rogene [place], MD;  Location: [facility];  Service: Endoscopy;  Laterality: N/A;  . Eye surgery      cataracts removed from both eyes   . Abdominal aortic endovascular stent graft N/A  05/25/2014    Procedure: ABDOMINAL AORTIC ENDOVASCULAR STENT GRAFT;  Surgeon: Elam Dutch, MD;  Location: [facility];  Service: Vascular;  Laterality: N/A;    History reviewed. No pertinent family history.  Social History:  reports that he has never smoked. He does not have any smokeless tobacco history on file. He reports that he does not drink alcohol or use illicit drugs.  Allergies:  Allergies  Allergen Reactions  . Loratadine Other (See Comments)    dizziness (PER MAR)  . Penicillins Swelling    Swelling of hands (per [place] records)  . Darvocet [Propoxyphene N-Acetaminophen] Other (See Comments)    Propoxyphene (Per MAR)  . Fish Oil Other (See Comments)    unk  . Lactose Intolerance (Gi) Other (See Comments)    unk     Medications:  I have reviewed the patient's current medications. Scheduled: . budesonide  0.25 mg Inhalation BID  . docusate sodium  100 mg Oral BID  . feeding supplement (ENSURE ENLIVE)  237 mL Oral Daily  . ferrous sulfate  325 mg Oral BID WC  . levothyroxine  75 mcg Oral QAC breakfast  . predniSONE  1 mg Oral Q breakfast    Results for orders placed or performed during the hospital encounter of 12/04/15 (from the past 24 hour(s))  Basic metabolic panel  Status: Abnormal   Collection Time: 12/04/15 10:15 PM  Result Value Ref Range   Sodium 139 135 - 145 mmol/L   Potassium 4.0 3.5 - 5.1 mmol/L   Chloride 113 (H) 101 - 111 mmol/L   CO2 19 (L) 22 - 32 mmol/L   Glucose, Bld 117 (H) 65 - 99 mg/dL   BUN 27 (H) 6 - 20 mg/dL   Creatinine, Ser 1.31 (H) 0.61 - 1.24 mg/dL   Calcium 9.0 8.9 - 10.3 mg/dL   GFR calc non Af Amer 44 (L) >60 mL/min   GFR calc Af Amer 51 (L) >60 mL/min   Anion gap 7 5 - 15  CBC with Differential     Status: Abnormal   Collection Time: 12/04/15 10:15 PM  Result Value Ref Range   WBC 9.2 4.0 - 10.5 K/uL   RBC 4.08 (L) 4.22 - 5.81 MIL/uL   Hemoglobin 12.2 (L) 13.0 - 17.0 g/dL   HCT 36.8 (L) 39.0 - 52.0 %   MCV 90.2 78.0 -  100.0 fL   MCH 29.9 26.0 - 34.0 pg   MCHC 33.2 30.0 - 36.0 g/dL   RDW 13.2 11.5 - 15.5 %   Platelets 149 (L) 150 - 400 K/uL   Neutrophils Relative % 76 %   Neutro Abs 7.0 1.7 - 7.7 K/uL   Lymphocytes Relative 13 %   Lymphs Abs 1.2 0.7 - 4.0 K/uL   Monocytes Relative 4 %   Monocytes Absolute 0.4 0.1 - 1.0 K/uL   Eosinophils Relative 7 %   Eosinophils Absolute 0.6 0.0 - 0.7 K/uL   Basophils Relative 0 %   Basophils Absolute 0.0 0.0 - 0.1 K/uL  CBC     Status: Abnormal   Collection Time: 12/05/15  1:46 AM  Result Value Ref Range   WBC 8.5 4.0 - 10.5 K/uL   RBC 3.86 (L) 4.22 - 5.81 MIL/uL   Hemoglobin 11.7 (L) 13.0 - 17.0 g/dL   HCT 34.9 (L) 39.0 - 52.0 %   MCV 90.4 78.0 - 100.0 fL   MCH 30.3 26.0 - 34.0 pg   MCHC 33.5 30.0 - 36.0 g/dL   RDW 13.4 11.5 - 15.5 %   Platelets 132 (L) 150 - 400 K/uL  Basic metabolic panel     Status: Abnormal   Collection Time: 12/05/15  1:46 AM  Result Value Ref Range   Sodium 138 135 - 145 mmol/L   Potassium 4.0 3.5 - 5.1 mmol/L   Chloride 117 (H) 101 - 111 mmol/L   CO2 15 (L) 22 - 32 mmol/L   Glucose, Bld 117 (H) 65 - 99 mg/dL   BUN 25 (H) 6 - 20 mg/dL   Creatinine, Ser 1.26 (H) 0.61 - 1.24 mg/dL   Calcium 8.5 (L) 8.9 - 10.3 mg/dL   GFR calc non Af Amer 46 (L) >60 mL/min   GFR calc Af Amer 54 (L) >60 mL/min   Anion gap 6 5 - 15  Type and screen [place]     Status: None   Collection Time: 12/05/15  1:46 AM  Result Value Ref Range   ABO/RH(D) A POS    Antibody Screen NEG    Sample Expiration 12/08/2015   Surgical PCR screen     Status: None   Collection Time: 12/05/15  2:02 AM  Result Value Ref Range   MRSA, PCR NEGATIVE NEGATIVE   Staphylococcus aureus NEGATIVE NEGATIVE    X-ray: CLINICAL DATA: 81 year old male with fall  and left hip pain.  EXAM: DG HIP (WITH OR WITHOUT PELVIS) 2-3V LEFT  COMPARISON: CT of the abdomen pelvis dated 11/25/2014  FINDINGS: There is a mildly displaced transverse fracture of  the left femoral neck. There is mild proximal migration and impaction of the femoral shaft. The head of the femur remains in anatomic alignment within the acetabulum. No other acute fracture identified. The bones are osteopenic.  An aorta bi-iliac endovascular stent graft is partially visualized. Postsurgical changes of bowel resection with anastomotic suture in the right lower quadrant. Air within the urinary bladder likely introduced via catheterization.  IMPRESSION: Mildly impacted fracture of the left femoral neck.  ROS  Pt complains of LEFT hip pain with movement. Review of systems limited by severely hard of hearing and poor baseline functional status but all systems were reviewed as able and negative except for hip pain and hand pain.  No recent hospitalizations,but significant vascular history but non-cardiac  Blood pressure 118/59, pulse 107, temperature 98.1 F (36.7 C), temperature source Oral, resp. rate 18, SpO2 100 %.  Physical Exam  Alert Left hip short and externally rotated\ Left wrist no deformity NVI  General medical exam reviewed for pertinents  Assessment/Plan: Left displaced femoral neck fracture  Will need left hip hemiarthroplasty to remain functional and to help with pain Age consistent com-morbidities as risks for surgery  NPO Consent ordered  Maebel Marasco D 12/05/2015, 11:42 AM

## 2015-12-05 NOTE — Op Note (Signed)
NAME:  Corey Walker, Corey Walker                ACCOUNT NO.:  [id number]   MEDICAL RECORD NO.: [id number]   LOCATION:  [id number]                         FACILITY:  [facility]   DATE OF BIRTH:  [birth year]  PHYSICIAN:  Pietro Cassis. Alvan Dame, M.D.     DATE OF PROCEDURE:  12/05/15                               OPERATIVE REPORT     PREOPERATIVE DIAGNOSIS:  Left displaced femoral neck fracture.   POSTOPERATIVE DIAGNOSIS:  Left displaced femoral neck fracture.   PROCEDURE:  Left hip hemiarthroplasty utilizing DePuy component, size 6 standard [employer] stem with a 81mm unipolar ball with a +0 adapter.   SURGEON:  Pietro Cassis. Alvan Dame, MD   ASSISTANT:  OR team   ANESTHESIA:  General.   SPECIMENS:  None.   DRAINS:  None.   BLOOD LOSS:  About <100 cc.   COMPLICATIONS:  None.   INDICATION OF PROCEDURE:  [name] is a 82 year old male who lives in a skilled care environment.  He unfortunately had a fall at the facility last evening while ambulating.  He was admitted to the hospital after radiographs revealed a femoral neck fracture.  He was seen and evaluated and was scheduled for surgery for fixation.  The necessity of surgical repair was discussed with his family.  Consent was obtained after reviewing risks of infection, DVT, component failure, and need for revision surgery.   PROCEDURE IN DETAIL:  The patient was brought to the operative theater. Once adequate anesthesia, preoperative antibiotics, 1 g of Vancomycin administered, the patient was positioned into the right lateral decubitus position with the left side up.  The left lower extremity was then prepped and draped in sterile fashion.  A time-out was performed identifying the patient, planned procedure, and extremity.   A lateral incision was made off the proximal trochanter. Sharp dissection was carried down to the iliotibial band and gluteal fascia. The gluteal fascia was then incised for posterior approach.  The short external  rotators were taken down separate from the posterior capsule. An L capsulotomy was made preserving the posterior leaflet for later anatomic repair. Fracture site was identified and after removing comminuted segments of the posterior femoral neck, the femoral head was removed without difficulty and measured on the back table  using the sizing rings and determined to be 51 mm in diameter.   The proximal femur was then exposed.  Retractors placed.  I then drilled, opened the proximal femur.  Then I hand reamed once and  Irrigated the canal to try to prevent fat emboli.  I began broaching the femur with a starting broach up to a size 6 broach with good medial and lateral metaphyseal fit without evidence of any torsion or movement.  A trial reduction was carried out with a standard offset neck and a +0 adapter with a 50mm ball.  The hip reduced nicely.  The leg lengths appeared to be equal compared to the down leg.   The hip went through a range of motion without evidence of any subluxation or impingement.   Given these findings, the trial components removed.  The final 6  [employer]mit [employer] Fit stem was opened.  After irrigating the canal, the final stem was impacted and sat at the level where the broach was. Based on this and the trial reduction, a +0 adapter was opened and impacted in the 28mm unipolar ball onto a clean and dry trunnion.  The hip had been irrigated throughout the case and again at this point.  I re- Approximated the posterior capsule to the superior leaflet using a  #1 Vicryl.  The remainder of the wound was closed with #1 Vicryl in the iliotibial band and gluteal fascia, a  2-0 Vicryl in the sub-Q tissue and a running 4-0 Monocryl in the skin.  The hip was cleaned, dried, and dressed sterilely using Dermabond and Aquacel dressing.  He was then brought to recovery room, extubated in stable condition, tolerating the procedure well.       Pietro Cassis Alvan Dame, M.D.

## 2015-12-05 NOTE — Anesthesia Preprocedure Evaluation (Signed)
Anesthesia Evaluation  Patient identified by MRN, date of birth, ID band Patient confused    Reviewed: Allergy & Precautions, NPO status , Unable to perform ROS - Chart review only  History of Anesthesia Complications Negative for: history of anesthetic complications  Airway Mallampati: II  TM Distance: >3 FB Neck ROM: Full    Dental no notable dental hx. (+) Dental Advisory Given   Pulmonary neg pulmonary ROS,    Pulmonary exam normal        Cardiovascular hypertension, + Peripheral Vascular Disease  Normal cardiovascular exam     Neuro/Psych negative neurological ROS  negative psych ROS   GI/Hepatic negative GI ROS, Neg liver ROS,   Endo/Other  Hypothyroidism   Renal/GU Renal InsufficiencyRenal disease     Musculoskeletal   Abdominal   Peds  Hematology   Anesthesia Other Findings   Reproductive/Obstetrics                             Anesthesia Physical Anesthesia Plan  ASA: IV  Anesthesia Plan: General   Post-op Pain Management:    Induction: Intravenous  Airway Management Planned: Oral ETT  Additional Equipment:   Intra-op Plan:   Post-operative Plan: Extubation in OR  Informed Consent: I have reviewed the patients History and Physical, chart, labs and discussed the procedure including the risks, benefits and alternatives for the proposed anesthesia with the patient or authorized representative who has indicated his/her understanding and acceptance.   Dental advisory given and Consent reviewed with POA  Plan Discussed with: CRNA, Anesthesiologist and Surgeon  Anesthesia Plan Comments:         Anesthesia Quick Evaluation

## 2015-12-05 NOTE — Progress Notes (Signed)
Urine obtained and forwarded to lab per order.

## 2015-12-05 NOTE — H&P (Signed)
History and Physical  Patient Name: Corey Walker     [id number]    DOB: Mar 31, 1918    DOA: 12/04/2015 Referring provider: Dr. Alvan Dame PCP: Kenn File, MD   Patient coming from: [facility] in [place]     Chief Complaint: Hip pain and fall  HPI: Corey Walker is a 82 y.o. male with a past medical history significant for AAA s/p repair in 2015, hypothyroidism, PMR on prednisone 1 mg/day, large left inguinal hernia and CKD who presents with hip pain after a fall.  The patient was in his normal state of health until this evening when he was walking in the bathroom, suddenly "just fell" and had immediate severe LEFT hip pain and could not walk.  There was no preceding weakness, chest discomfort, palpitations, or malaise, nor are there any of those symptoms now.  The patient has no active heart disease, angina, or history of MI. He walks with a four wheeled walker, three times a day to the cafeteria but is otherwise sedentary and cannot exert to an equivalent of 4 METS.    Anesthesia Specific concerns: Presence of loose teeth: None Anesthesia problems in past: None History of bleeding disorder: None  Review of Systems:  Pt complains of LEFT hip pain with movement. Review of systems limited by severely hard of hearing and poor baseline functional status but all systems were reviewed as able and negative except for hip pain and hand pain.        Past Medical History  Diagnosis Date  . Hypertension   . Polymyalgia rheumatica ([place])   . Diverticulosis   . Thyroid disease   . Diarrhea   . Hypokalemia   . Colon cancer ([place]) 2001  . Skin cancer   . AAA (abdominal aortic aneurysm) ([facility])     4.6 X 4.9 cm  . Osteoporosis   . BPH (benign prostatic hyperplasia)   . Tinea unguium   . Temporal arteritis ([place])   . Obstructive jaundice     2013  . Pancreatitis     2013, 04/2013  . Inguinal hernia, left   . Hypothyroidism     Past Surgical History  Procedure Laterality  Date  . Colon surgery  2001    right colectomy for colon cancer  . Eus  07/13/2011    Dr. Carol Ada, inadequate conscious sedation. limited views of pancreas. No evidence of choledocholithiasis, CBD 40mm.  . Cholecystectomy    . Ercp N/A 05/21/2013    Procedure: ENDOSCOPIC RETROGRADE CHOLANGIOPANCREATOGRAPHY (ERCP) Sphincterotomy and stone extraction;  Surgeon: Rogene Houston, MD;  Location: [facility];  Service: Endoscopy;  Laterality: N/A;  . Eye surgery      cataracts removed from both eyes   . Abdominal aortic endovascular stent graft N/A 05/25/2014    Procedure: ABDOMINAL AORTIC ENDOVASCULAR STENT GRAFT;  Surgeon: Elam Dutch, MD;  Location: [facility];  Service: Vascular;  Laterality: N/A;    Social History: Patient lives at [facility] in [place].  Patient walks with a walker.  He has moderate dementia.  He is not a smoker.    Allergies  Allergen Reactions  . Loratadine Other (See Comments)    dizziness (PER MAR)  . Penicillins Swelling    Swelling of hands (per [place] records)  . Darvocet [Propoxyphene N-Acetaminophen] Other (See Comments)    Propoxyphene (Per MAR)  . Fish Oil Other (See Comments)    unk  . Lactose Intolerance (Gi) Other (See Comments)  unk     Family history: Reviewed, all died "of natural causes".    Prior to Admission medications   Medication Sig Start Date End Date Taking? Authorizing Provider  acetaminophen (TYLENOL) 500 MG tablet Take 500 mg by mouth every 4 (four) hours as needed for mild pain.   Yes Historical Provider, MD  anti-nausea (EMETROL) solution Take 5 mLs by mouth every 15 (fifteen) minutes as needed for nausea or vomiting.   Yes Historical Provider, MD  benzonatate (TESSALON) 100 MG capsule Take 100 mg by mouth 3 (three) times daily as needed for cough.   Yes Historical Provider, MD  budesonide (PULMICORT) 180 MCG/ACT inhaler Inhale 1 puff into the lungs 2 (two) times daily.   Yes Historical Provider, MD  cholecalciferol  (VITAMIN D) 400 UNITS TABS Take 400 Units by mouth 2 (two) times daily.   Yes Historical Provider, MD  cyanocobalamin (V-R VITAMIN B-12) 500 MCG tablet Take 1 tablet (500 mcg total) by mouth daily. 09/23/13  Yes Lysbeth Penner, FNP  docusate sodium (COLACE) 100 MG capsule Take 100 mg by mouth daily.   Yes Historical Provider, MD  feeding supplement (ENSURE IMMUNE HEALTH) LIQD Take 237 mLs by mouth daily.    Yes Historical Provider, MD  ferrous sulfate 325 (65 FE) MG tablet Take 2 tablets (650 mg total) by mouth daily with breakfast. Patient taking differently: Take 325 mg by mouth 2 (two) times daily with a meal.  11/12/14  Yes Sharion Balloon, FNP  fexofenadine (ALLEGRA) 180 MG tablet Take 1 tablet (180 mg total) by mouth daily. 05/11/15  Yes Wardell Honour, MD  furosemide (LASIX) 20 MG tablet Take 20 mg by mouth every other day.    Yes Historical Provider, MD  levothyroxine (SYNTHROID, LEVOTHROID) 75 MCG tablet Take 1 tablet (75 mcg total) by mouth daily. 08/23/15  Yes Timmothy Euler, MD  lisinopril (PRINIVIL,ZESTRIL) 10 MG tablet Take 10 mg by mouth daily.   Yes Historical Provider, MD  loperamide (IMODIUM) 2 MG capsule Take 2 mg by mouth as needed for diarrhea or loose stools.   Yes Historical Provider, MD  Polyethyl Glycol-Propyl Glycol (SYSTANE) 0.4-0.3 % SOLN Apply 1 drop to eye 4 (four) times daily.   Yes Historical Provider, MD  predniSONE (DELTASONE) 1 MG tablet Take 1 tablet (1 mg total) by mouth daily with breakfast. 08/22/15  Yes Timmothy Euler, MD  promethazine (PHENERGAN) 12.5 MG tablet Take 12.5 mg by mouth every 8 (eight) hours as needed for nausea or vomiting.   Yes Historical Provider, MD  donepezil (ARICEPT) 5 MG tablet Take 1 tablet (5 mg total) by mouth at bedtime. 09/23/13   Lysbeth Penner, FNP  predniSONE (DELTASONE) 1 MG tablet Take 4 tablets (4 mg total) by mouth daily with breakfast. 08/22/15   Timmothy Euler, MD  predniSONE (DELTASONE) 1 MG tablet Take 3 tablets  (3 mg total) by mouth daily with breakfast. 08/22/15   Timmothy Euler, MD  predniSONE (DELTASONE) 1 MG tablet Take 2 tablets (2 mg total) by mouth daily with breakfast. 08/22/15   Timmothy Euler, MD       Physical Exam: BP 113/65 mmHg  Pulse 68  Resp 21  SpO2 98% General appearance: Cachectic, elderly male, awake but markedly HOH.  Responds slowly to questions.  Oriented to "hospital".  In pain. Eyes: Anicteric, conjunctiva pink, lids and lashes without deformity.     ENT: Small abrasion to LEFT external ear.  No nasal discharge,  or epistaxis.  OP with dry MM.  No lesions.   Skin: Warm and dry.  Diffuse seborrhea and senile purpura.  On left hand there is a large skin tear on the thenar eminence. Cardiac: RRR, nl S1-S2, 1/6 SEM.  Capillary refill is brisk.  JVP normal.  No LE edema.  Radial pulses 2+ and symmetric, DP pulses diminished. Respiratory: Normal respiratory rate and rhythm.  Good air movement, some coarse airway sounds that clear with cough, no wheezes, rales. GI: Abdomen scaphoid without rigidity.  No TTP. No ascites, distension.  No HSM.  Large left soft inguinal hernia. MSK: Extremities withered.  Left leg shortened and externally rotated and extreme pain to touch. Neuro: Awake and oriented to place.  Cranial nerves grossly intact except HOH.  Speech is dysarthric from dry mouth.      Psych: No evidence of aural or visual hallucinations or delusions.       Labs on Admission:  I have personally reviewed following labs and imaging studies: CBC:  Recent Labs Lab 12/04/15 2215  WBC 9.2  NEUTROABS 7.0  HGB 12.2*  HCT 36.8*  MCV 90.2  PLT 123456*   Basic Metabolic Panel:  Recent Labs Lab 12/04/15 2215  NA 139  K 4.0  CL 113*  CO2 19*  GLUCOSE 117*  BUN 27*  CREATININE 1.31*  CALCIUM 9.0     Radiological Exams on Admission: Personally reviewed: Dg Chest 1 View  12/04/2015  CLINICAL DATA:  82 year old male with fall EXAM: CHEST 1 VIEW COMPARISON:  Chest  radiograph dated 05/25/2014 FINDINGS: Single-view of the chest demonstrates emphysematous changes of the lungs. No focal consolidation, pleural effusion, or pneumothorax. The cardiac silhouette is within normal limits. The aorta is tortuous. There is osteopenia with degenerative changes of the spine. No acute fracture. IMPRESSION: No active disease. Electronically Signed   By: Anner Crete M.D.   On: 12/04/2015 23:31   Ct Head Wo Contrast  12/04/2015  CLINICAL DATA:  Unwitnessed fall.  Left hip pain. EXAM: CT HEAD WITHOUT CONTRAST CT CERVICAL SPINE WITHOUT CONTRAST TECHNIQUE: Multidetector CT imaging of the head and cervical spine was performed following the standard protocol without intravenous contrast. Multiplanar CT image reconstructions of the cervical spine were also generated. COMPARISON:  CT head 11/17/2008 FINDINGS: CT HEAD FINDINGS Diffuse cerebral atrophy. Low-attenuation changes throughout the deep white matter consistent small vessel ischemia. Mild ventricular dilatation consistent with central atrophy. Small hyperdense extra-axial lesion along the left side of the upper falx measuring about 9 mm diameter. This is unchanged since previous study and probably represents a small calcified meningioma. No mass effect or midline shift. No abnormal extra-axial fluid collections. Gray-white matter junctions are distinct. Basal cisterns are not effaced. No evidence of acute intracranial hemorrhage. No depressed skull fractures. Visualized paranasal sinuses and mastoid air cells are not opacified. Vascular calcifications. CT CERVICAL SPINE FINDINGS Normal alignment of the cervical spine. Degenerative changes throughout the cervical spine with narrowed interspaces and associated endplate hypertrophic changes. No vertebral compression deformities. No prevertebral soft tissue swelling. C1-2 articulation appears intact. No focal bone lesion or bone destruction. Vascular calcifications. The upper chest  demonstrates apical pleural thickening and scarring with mild bronchiectasis. Secretions within the trachea. Dilated aortic arch with diameter of about 3.7 cm, incompletely included within the field of view. IMPRESSION: No acute intracranial abnormalities. Chronic atrophy and small vessel ischemic changes. Small left para fall seen calcified meningioma. Normal alignment of the cervical spine with diffuse degenerative changes. No acute displaced fractures  identified. Electronically Signed   By: Lucienne Capers M.D.   On: 12/04/2015 23:44   Ct Cervical Spine Wo Contrast  12/04/2015  CLINICAL DATA:  Unwitnessed fall.  Left hip pain. EXAM: CT HEAD WITHOUT CONTRAST CT CERVICAL SPINE WITHOUT CONTRAST TECHNIQUE: Multidetector CT imaging of the head and cervical spine was performed following the standard protocol without intravenous contrast. Multiplanar CT image reconstructions of the cervical spine were also generated. COMPARISON:  CT head 11/17/2008 FINDINGS: CT HEAD FINDINGS Diffuse cerebral atrophy. Low-attenuation changes throughout the deep white matter consistent small vessel ischemia. Mild ventricular dilatation consistent with central atrophy. Small hyperdense extra-axial lesion along the left side of the upper falx measuring about 9 mm diameter. This is unchanged since previous study and probably represents a small calcified meningioma. No mass effect or midline shift. No abnormal extra-axial fluid collections. Gray-white matter junctions are distinct. Basal cisterns are not effaced. No evidence of acute intracranial hemorrhage. No depressed skull fractures. Visualized paranasal sinuses and mastoid air cells are not opacified. Vascular calcifications. CT CERVICAL SPINE FINDINGS Normal alignment of the cervical spine. Degenerative changes throughout the cervical spine with narrowed interspaces and associated endplate hypertrophic changes. No vertebral compression deformities. No prevertebral soft tissue  swelling. C1-2 articulation appears intact. No focal bone lesion or bone destruction. Vascular calcifications. The upper chest demonstrates apical pleural thickening and scarring with mild bronchiectasis. Secretions within the trachea. Dilated aortic arch with diameter of about 3.7 cm, incompletely included within the field of view. IMPRESSION: No acute intracranial abnormalities. Chronic atrophy and small vessel ischemic changes. Small left para fall seen calcified meningioma. Normal alignment of the cervical spine with diffuse degenerative changes. No acute displaced fractures identified. Electronically Signed   By: Lucienne Capers M.D.   On: 12/04/2015 23:44   Dg Hand Complete Left  12/05/2015  CLINICAL DATA:  Ecchymosis and skin tears on the thumb after a fall. EXAM: LEFT HAND - COMPLETE 3+ VIEW COMPARISON:  None. FINDINGS: Degenerative changes in the interphalangeal joints, first carpometacarpal and metacarpal phalangeal joints, STT joints, and radiocarpal joints of the left hand and wrist. No evidence of acute fracture or dislocation. No soft tissue foreign bodies. IMPRESSION: Diffuse degenerative changes in the left hand and wrist. Electronically Signed   By: Lucienne Capers M.D.   On: 12/05/2015 00:32   Dg Hip Unilat With Pelvis 2-3 Views Left  12/04/2015  CLINICAL DATA:  82 year old male with fall and left hip pain. EXAM: DG HIP (WITH OR WITHOUT PELVIS) 2-3V LEFT COMPARISON:  CT of the abdomen pelvis dated 11/25/2014 FINDINGS: There is a mildly displaced transverse fracture of the left femoral neck. There is mild proximal migration and impaction of the femoral shaft. The head of the femur remains in anatomic alignment within the acetabulum. No other acute fracture identified. The bones are osteopenic. An aorta bi-iliac endovascular stent graft is partially visualized. Postsurgical changes of bowel resection with anastomotic suture in the right lower quadrant. Air within the urinary bladder likely  introduced via catheterization. IMPRESSION: Mildly impacted fracture of the left femoral neck. Electronically Signed   By: Anner Crete M.D.   On: 12/04/2015 23:33   Dg Femur 1v Left  12/04/2015  CLINICAL DATA:  Left hip pain after fall in the shower. EXAM: LEFT FEMUR 1 VIEW COMPARISON:  None. FINDINGS: Views obtained including the mid and distal shaft of the left femur demonstrate no acute fracture or dislocation in the visualized bones. Degenerative changes in the left knee. Vascular calcifications. IMPRESSION: No fractures  demonstrated in the visualized midshaft and distal left femur. Electronically Signed   By: Lucienne Capers M.D.   On: 12/04/2015 23:30    EKG: Independently reviewed. Rate 94, QTc 500.  Very poor baseline, but P waves appear present, appear to be frequent PACs.  Will repeat.      Assessment and Plan: 1. Hip fracture: The patient will be seen by Dr. Alvan Dame in the morning at [facility], to evaluate for operative fixation of the LEFT hip.   -Admit to med-surg bed -Hydrocodone-acetaminophen or morphine as tolerated for pain -Bed rest, apply ice, document sedation and vitals per Hip fracture protocol -NPO at midnight -MIVF -Nutrition consulted -Hold following meds:  -Lisinopril and furosemide   Overall, the patient is at higher than average risk for the planned surgery given age and poor baseline functional status.    Cardiovascular risk: Patient has a RCRI score of 1 (given vascular disease/AAA s/p repair).  Had an echocardiogram with EF 55-60% and no significant valvular disease as well as low-risk NM perfusion study pre-operatively for AAA repair in 2015, no other known history of coronary disease.   -Given no complication when he underwent AAA repair in Dec 2015, likely further testing at this time would have low yield, will discuss with Cardiology and formally consult if needed.  Pulmonary risk: Patient does not have diagnosed COPDbut is on daily inhaled corticosteroid.  Patient is at average risk for pulmonary complications.   Endocrine risk: Patient has 2 year history of prednisone use for PMR, <5 mg/day over the last year, currently tapered to 1 mg/day.      2. Hypothyroidism: -Continue levothyroxine  3. PMR: Tapering off 2 years prednisone this spring.   -Continue prednisone 1 mg daily (to be finished 7/27) -Monitor for hypotension post-operatively  4. Inguinal hernia: Chronic, not incarcerated clinically  5. CKD: Creatinine slightly above baseline 1.1 mg/dL, BUN elevated. -Trend BMP post-operatively  6. Hyperchloremic metabolic acidosis: Mild. Unclear if this is from IV saline given en route. -Trend BMP  7. Chronic normocytic anemia: At baseline 12 g/dL -Continue iron  8. Chronic lung disease: Not specified in chart but on budesonide. -Continue budesonide daily  9. Severe end-stage protein calorie malnutrition: -Continue Ensure  10. Essential hypertension: -Hold lisinopril and furosemide pre-operatively, restart after -Furosemide is every other day  11. Prolonged QTc: -Repeat ECG for better baseline       DVT prophylaxis: SCDs  Diet: NPO and MIVF Code Status: DO NOT RESUSCITATE  Family Communication: Called to daughter Alfonse Flavors, no answer, left VM.  Disposition Plan: Anticipate evaluation by Orthopedics and likely surgical fixation tomorrow, then PT evaluation and discharge back to SNF. Admission status: INPATIENT for hip fracture, medical surgical bed     Medical decision making and consults: Patient seen at 12:39 AM on 12/05/2015.  The patient was discussed with Dr. Rex Kras. Clinical condition: stable.      Edwin Dada [facility] Pager [phone]

## 2015-12-05 NOTE — Consult Note (Addendum)
CARDIOLOGY CONSULT NOTE   Patient ID: DESHAE JAGIELSKI MRN: [id number] DOB/AGE: September 15, 1917 82 y.o.  Admit date: 12/04/2015  Primary Physician   Kenn File, MD Primary Cardiologist   Dr Percival Spanish Reason for Consultation   preop eval Requesting MD: Dr Loleta Books  [id number] D Hatchell is a 82 y.o. year old male with a history of HTN, hypothyroid, CKD, colon CA, pancreatitis, large L inguinal hernia, PMR, AAA repair 2015. MV at that time was normal w/ EF 70%, echo 2015 w/ EF 55-60%, mild AS.  Pt fell going to the bathroom yesterday and has L hip fx. Cards asked to see preop.  Mr Baack does not remember why he fell. It happened at about 4 pm, while walking toward the bathroom. He may have been a little dizzy, but does not think he lost consciousness. He has been weak lately. He has not had chest pain, no SOB. No palpitations. Activity is limited at baseline, how much is not clear. He lives alone, daughter lives nearby. He does not drive.    Past Medical History  Diagnosis Date  . Hypertension   . Polymyalgia rheumatica ([facility])   . Diverticulosis   . Thyroid disease   . Diarrhea   . Hypokalemia   . Colon cancer ([facility]) 2001  . Skin cancer   . AAA (abdominal aortic aneurysm) ([facility])     4.6 X 4.9 cm  . Osteoporosis   . BPH (benign prostatic hyperplasia)   . Tinea unguium   . Temporal arteritis ([facility])   . Obstructive jaundice     2013  . Pancreatitis     2013, 04/2013  . Inguinal hernia, left   . Hypothyroidism      Past Surgical History  Procedure Laterality Date  . Colon surgery  2001    right colectomy for colon cancer  . Eus  07/13/2011    Dr. Carol Ada, inadequate conscious sedation. limited views of pancreas. No evidence of choledocholithiasis, CBD 65mm.  . Cholecystectomy    . Ercp N/A 05/21/2013    Procedure: ENDOSCOPIC RETROGRADE CHOLANGIOPANCREATOGRAPHY (ERCP) Sphincterotomy and stone extraction;  Surgeon: Rogene Houston, MD;  Location: [facility];  Service:  Endoscopy;  Laterality: N/A;  . Eye surgery      cataracts removed from both eyes   . Abdominal aortic endovascular stent graft N/A 05/25/2014    Procedure: ABDOMINAL AORTIC ENDOVASCULAR STENT GRAFT;  Surgeon: Elam Dutch, MD;  Location: [facility];  Service: Vascular;  Laterality: N/A;    Allergies  Allergen Reactions  . Loratadine Other (See Comments)    dizziness (PER MAR)  . Penicillins Swelling    Swelling of hands (per [facility] records)  . Darvocet [Propoxyphene N-Acetaminophen] Other (See Comments)    Propoxyphene (Per MAR)  . Fish Oil Other (See Comments)    unk  . Lactose Intolerance (Gi) Other (See Comments)    unk     I have reviewed the patient's current medications . budesonide  0.25 mg Inhalation BID  . docusate sodium  100 mg Oral BID  . feeding supplement (ENSURE ENLIVE)  237 mL Oral Daily  . ferrous sulfate  325 mg Oral BID WC  . levothyroxine  75 mcg Oral QAC breakfast  . predniSONE  1 mg Oral Q breakfast   . sodium chloride 100 mL/hr at 12/05/15 0141   bisacodyl, HYDROcodone-acetaminophen, morphine injection, polyethylene glycol  Medication Sig  acetaminophen (TYLENOL) 500 MG tablet Take 500 mg by mouth every 4 (four)  hours as needed for mild pain.  anti-nausea (EMETROL) solution Take 5 mLs by mouth every 15 (fifteen) minutes as needed for nausea or vomiting.  benzonatate (TESSALON) 100 MG capsule Take 100 mg by mouth 3 (three) times daily as needed for cough.  budesonide (PULMICORT) 180 MCG/ACT inhaler Inhale 1 puff into the lungs 2 (two) times daily.  cholecalciferol (VITAMIN D) 400 UNITS TABS Take 400 Units by mouth 2 (two) times daily.  cyanocobalamin (V-R VITAMIN B-12) 500 MCG tablet Take 1 tablet (500 mcg total) by mouth daily.  docusate sodium (COLACE) 100 MG capsule Take 100 mg by mouth daily.  feeding supplement (ENSURE IMMUNE HEALTH) LIQD Take 237 mLs by mouth daily.   ferrous sulfate 325 (65 FE) MG tablet Take 2 tablets (650 mg total) by mouth  daily with breakfast. Patient taking differently: Take 325 mg by mouth 2 (two) times daily with a meal.   fexofenadine (ALLEGRA) 180 MG tablet Take 1 tablet (180 mg total) by mouth daily.  furosemide (LASIX) 20 MG tablet Take 20 mg by mouth every other day.   levothyroxine (SYNTHROID, LEVOTHROID) 75 MCG tablet Take 1 tablet (75 mcg total) by mouth daily.  lisinopril (PRINIVIL,ZESTRIL) 10 MG tablet Take 10 mg by mouth daily.  loperamide (IMODIUM) 2 MG capsule Take 2 mg by mouth as needed for diarrhea or loose stools.  Polyethyl Glycol-Propyl Glycol (SYSTANE) 0.4-0.3 % SOLN Apply 1 drop to eye 4 (four) times daily.  predniSONE (DELTASONE) 1 MG tablet Take 1 tablet (1 mg total) by mouth daily with breakfast.  promethazine (PHENERGAN) 12.5 MG tablet Take 12.5 mg by mouth every 8 (eight) hours as needed for nausea or vomiting.     Social History   Social History  . Marital Status: Widowed    Spouse Name: N/A  . Number of Children: N/A  . Years of Education: N/A   Occupational History  . Retired    Social History Main Topics  . Smoking status: Never Smoker   . Smokeless tobacco: Not on file  . Alcohol Use: No  . Drug Use: No  . Sexual Activity: Not on file   Other Topics Concern  . Not on file   Social History Narrative   Lives in [place].    Family Status  Relation Status Death Age  . Mother Deceased   . Father Deceased    History reviewed. No pertinent family history.   ROS:  Full 14 point review of systems complete and found to be negative unless listed above.  Physical Exam: Blood pressure 118/59, pulse 107, temperature 98.1 F (36.7 C), temperature source Oral, resp. rate 18, SpO2 100 %.  General: Well developed, well nourished, male in no acute distress Head: Eyes PERRLA, No xanthomas.   Normocephalic and atraumatic, oropharynx without edema or exudate. Dentition: poor Lungs: poor insp effort but clear Heart: Heart irregular rate and rhythm with S1, S2, 2/6   murmur. pulses are 2+ all 4 extrem.   Neck: No carotid bruits. No lymphadenopathy.  JVD not elevated Abdomen: Bowel sounds present, abdomen soft and non-tender without masses or hernias noted. Msk:  No spine or cva tenderness. Generalize weakness, no joint deformities or effusions. Extremities: No clubbing or cyanosis. No edema.  Neuro: Alert and oriented X 1. No focal deficits noted. Psych:  Good affect, responds appropriately Skin: No rashes or lesions noted. Multiple areas of ecchymosis noted  Labs:   Lab Results  Component Value Date   WBC 8.5 12/05/2015   HGB 11.7*  12/05/2015   HCT 34.9* 12/05/2015   MCV 90.4 12/05/2015   PLT 132* 12/05/2015     Recent Labs Lab 12/05/15 0146  NA 138  K 4.0  CL 117*  CO2 15*  BUN 25*  CREATININE 1.26*  CALCIUM 8.5*  GLUCOSE 117*   Lab Results  Component Value Date   TSH 8.370* 08/22/2015    Echo: 04/2014 - Left ventricle: The cavity size was normal. Wall thickness was increased in a pattern of mild LVH. Systolic function was normal. The estimated ejection fraction was in the range of 55% to 65%. - Aortic valve: There was mild stenosis. - Mitral valve: Calcified leaflets, no stenosis, Valve area by  pressure half-time: 1.88 cm^2. - Left atrium: The atrium was mildly dilated. - Atrial septum: No defect or patent foramen ovale was identified.  ECG:  07/10 SR, PACs, artifact  Myoview: 04/2014  Impression Exercise Capacity: Lexiscan with no exercise. BP Response: Normal blood pressure response. Clinical Symptoms: There is dyspnea. ECG Impression: No significant ST segment change suggestive of ischemia. Comparison with Prior Nuclear Study: No previous nuclear study performed Overall Impression: Normal stress nuclear study; note transient asymptomatic accelerated intermittent junctional rhythm (HR 61) prior to the study. LV Wall Motion: NL LV Function; NL Wall Motion  Radiology:  Dg Chest 1 View 12/04/2015  CLINICAL  DATA:  82 year old male with fall EXAM: CHEST 1 VIEW COMPARISON:  Chest radiograph dated 05/25/2014 FINDINGS: Single-view of the chest demonstrates emphysematous changes of the lungs. No focal consolidation, pleural effusion, or pneumothorax. The cardiac silhouette is within normal limits. The aorta is tortuous. There is osteopenia with degenerative changes of the spine. No acute fracture. IMPRESSION: No active disease. Electronically Signed   By: Anner Crete M.D.   On: 12/04/2015 23:31   Ct Head Wo Contrast 12/04/2015  CLINICAL DATA:  Unwitnessed fall.  Left hip pain. EXAM: CT HEAD WITHOUT CONTRAST CT CERVICAL SPINE WITHOUT CONTRAST TECHNIQUE: Multidetector CT imaging of the head and cervical spine was performed following the standard protocol without intravenous contrast. Multiplanar CT image reconstructions of the cervical spine were also generated. COMPARISON:  CT head 11/17/2008 FINDINGS: CT HEAD FINDINGS Diffuse cerebral atrophy. Low-attenuation changes throughout the deep white matter consistent small vessel ischemia. Mild ventricular dilatation consistent with central atrophy. Small hyperdense extra-axial lesion along the left side of the upper falx measuring about 9 mm diameter. This is unchanged since previous study and probably represents a small calcified meningioma. No mass effect or midline shift. No abnormal extra-axial fluid collections. Gray-white matter junctions are distinct. Basal cisterns are not effaced. No evidence of acute intracranial hemorrhage. No depressed skull fractures. Visualized paranasal sinuses and mastoid air cells are not opacified. Vascular calcifications. CT CERVICAL SPINE FINDINGS Normal alignment of the cervical spine. Degenerative changes throughout the cervical spine with narrowed interspaces and associated endplate hypertrophic changes. No vertebral compression deformities. No prevertebral soft tissue swelling. C1-2 articulation appears intact. No focal bone lesion  or bone destruction. Vascular calcifications. The upper chest demonstrates apical pleural thickening and scarring with mild bronchiectasis. Secretions within the trachea. Dilated aortic arch with diameter of about 3.7 cm, incompletely included within the field of view. IMPRESSION: No acute intracranial abnormalities. Chronic atrophy and small vessel ischemic changes. Small left para fall seen calcified meningioma. Normal alignment of the cervical spine with diffuse degenerative changes. No acute displaced fractures identified. Electronically Signed   By: Lucienne Capers M.D.   On: 12/04/2015 23:44   Ct Cervical Spine Wo Contrast 12/04/2015  CLINICAL DATA:  Unwitnessed fall.  Left hip pain. EXAM: CT HEAD WITHOUT CONTRAST CT CERVICAL SPINE WITHOUT CONTRAST TECHNIQUE: Multidetector CT imaging of the head and cervical spine was performed following the standard protocol without intravenous contrast. Multiplanar CT image reconstructions of the cervical spine were also generated. COMPARISON:  CT head 11/17/2008 FINDINGS: CT HEAD FINDINGS Diffuse cerebral atrophy. Low-attenuation changes throughout the deep white matter consistent small vessel ischemia. Mild ventricular dilatation consistent with central atrophy. Small hyperdense extra-axial lesion along the left side of the upper falx measuring about 9 mm diameter. This is unchanged since previous study and probably represents a small calcified meningioma. No mass effect or midline shift. No abnormal extra-axial fluid collections. Gray-white matter junctions are distinct. Basal cisterns are not effaced. No evidence of acute intracranial hemorrhage. No depressed skull fractures. Visualized paranasal sinuses and mastoid air cells are not opacified. Vascular calcifications. CT CERVICAL SPINE FINDINGS Normal alignment of the cervical spine. Degenerative changes throughout the cervical spine with narrowed interspaces and associated endplate hypertrophic changes. No vertebral  compression deformities. No prevertebral soft tissue swelling. C1-2 articulation appears intact. No focal bone lesion or bone destruction. Vascular calcifications. The upper chest demonstrates apical pleural thickening and scarring with mild bronchiectasis. Secretions within the trachea. Dilated aortic arch with diameter of about 3.7 cm, incompletely included within the field of view. IMPRESSION: No acute intracranial abnormalities. Chronic atrophy and small vessel ischemic changes. Small left para fall seen calcified meningioma. Normal alignment of the cervical spine with diffuse degenerative changes. No acute displaced fractures identified. Electronically Signed   By: Lucienne Capers M.D.   On: 12/04/2015 23:44   Dg Hand Complete Left 12/05/2015  CLINICAL DATA:  Ecchymosis and skin tears on the thumb after a fall. EXAM: LEFT HAND - COMPLETE 3+ VIEW COMPARISON:  None. FINDINGS: Degenerative changes in the interphalangeal joints, first carpometacarpal and metacarpal phalangeal joints, STT joints, and radiocarpal joints of the left hand and wrist. No evidence of acute fracture or dislocation. No soft tissue foreign bodies. IMPRESSION: Diffuse degenerative changes in the left hand and wrist. Electronically Signed   By: Lucienne Capers M.D.   On: 12/05/2015 00:32   Dg Hip Unilat With Pelvis 2-3 Views Left 12/04/2015  CLINICAL DATA:  82 year old male with fall and left hip pain. EXAM: DG HIP (WITH OR WITHOUT PELVIS) 2-3V LEFT COMPARISON:  CT of the abdomen pelvis dated 11/25/2014 FINDINGS: There is a mildly displaced transverse fracture of the left femoral neck. There is mild proximal migration and impaction of the femoral shaft. The head of the femur remains in anatomic alignment within the acetabulum. No other acute fracture identified. The bones are osteopenic. An aorta bi-iliac endovascular stent graft is partially visualized. Postsurgical changes of bowel resection with anastomotic suture in the right lower  quadrant. Air within the urinary bladder likely introduced via catheterization. IMPRESSION: Mildly impacted fracture of the left femoral neck. Electronically Signed   By: Anner Crete M.D.   On: 12/04/2015 23:33   Dg Femur 1v Left 12/04/2015  CLINICAL DATA:  Left hip pain after fall in the shower. EXAM: LEFT FEMUR 1 VIEW COMPARISON:  None. FINDINGS: Views obtained including the mid and distal shaft of the left femur demonstrate no acute fracture or dislocation in the visualized bones. Degenerative changes in the left knee. Vascular calcifications. IMPRESSION: No fractures demonstrated in the visualized midshaft and distal left femur. Electronically Signed   By: Lucienne Capers M.D.   On: 12/04/2015 23:30    ASSESSMENT AND  PLAN:   The patient was seen today by Dr Tamala Julian, the patient evaluated and the data reviewed.  Principal Problem: 1.  Closed left hip fracture ([facility]) - per IM/Ortho - pt is currently NPO for surgery  Active Problems: 2.  Essential hypertension - per IM, good control on current rx  3.  Hypothyroidism - per IM, on Synthroid 75 mcg/day  4.  AAA (abdominal aortic aneurysm) ([facility]) - last CT 10/2014 w/ persistent type 2 endoleak, overall sack size was stable - per Dr Nita Sickle  5.  Unilateral inguinal hernia without obstruction - per IM  6.  CKD (chronic kidney disease), stage III - BUN no change, Cr a little higher than baseline - per IM  7.  Protein-calorie malnutrition, severe ([facility]) - weights in system reviewed, no recent change. Suspect he eats poorly at baseline - per IM  8. Preop eval - Mr Tata does not have ongoing ischemic symptoms. - However, he cannot say why he fell. - Heartbeat is slightly irregular, probably PACs or PVCs - will ck troponin and BNP to service baseline and to make sure no acute ischemic/heart failure state. - would use telemetry in the post-op period - his operative risk is related to age and frailty, no cardiac reason not to do  surgery.   SignedLenoard Aden 12/05/2015 11:21 AM Murlean Hark [id number]  Co-Sign MD The patient has been seen in conjunction with Rosaria Ferries PA-C. All aspects of care have been considered and discussed. The patient has been personally interviewed, examined, and all clinical data has been reviewed.   The overall clinical picture is that of an elderly, frail gentleman who had a mechanical left hip fracture following a fall. We are unable to determine the actual cause for the fall i.e. syncope, frailty, trip, etc. He denies dyspnea and chest discomfort. There is also history of abdominal aortic aneurysm (unruptured) and large left inguinal hernia.  Exam shows no evidence of volume overload, chest wall tenderness, or edema. EKG reveals PACs but no acute ischemic change.  Overall the patient seems to be compensated and is having no acute cardiac issue at this time. He is cleared to proceed with left hip operation for traumatic fracture. He will be at increased risk for potential cardiac complications, with the most common/likely to be rhythm disturbance and/or volume overload.  We will obtain a baseline BNP and troponin as noted above.

## 2015-12-05 NOTE — Progress Notes (Signed)
Initial Nutrition Assessment  DOCUMENTATION CODES:   Severe malnutrition in context of chronic illness  INTERVENTION:  Once diet advances, provide Ensure Enlive po BID, each supplement provides 350 kcal and 20 grams of protein.  Recommend obtaining new weight to fully assess weight trends.  NUTRITION DIAGNOSIS:   Malnutrition related to chronic illness as evidenced by severe depletion of body fat, severe depletion of muscle mass.  GOAL:   Patient will meet greater than or equal to 90% of their needs  MONITOR:   Supplement acceptance, Weight trends, Labs, I & O's, Diet advancement, Skin  REASON FOR ASSESSMENT:   Consult, Low [place] Hip fracture protocol  ASSESSMENT:   82 y.o. male with a past medical history significant for AAA s/p repair in 2015, hypothyroidism, PMR on prednisone 1 mg/day, large left inguinal hernia and CKD who presents with hip pain after a fall.  Pt is currently NPO for surgery today. Pt reports having a good appetite usually and eating well with at least 3 meals a day. Pt reports usually consuming Ensure once daily. Noted no new weight recorded. Recommend obtaining new weight to fully assess weight trends. Pt currently has Ensure ordered. RD to increase Ensure orders to aid in caloric and protein needs. Nursing staff to provide once diet advances.   Nutrition-Focused physical exam completed. Findings are severe fat depletion, severe muscle depletion, and no edema.   Labs and medications reviewed.   Diet Order:  Diet NPO time specified Except for: Ice Chips, Sips with Meds  Skin:  Reviewed, no issues  Last BM:  Unknown  Height:   Ht Readings from Last 1 Encounters:  08/22/15 5\' 7"  (1.702 m)    Weight:   Wt Readings from Last 1 Encounters:  08/22/15 117 lb 12.8 oz (53.434 kg)    Ideal Body Weight:  67.27 kg  BMI:  There is no weight on file to calculate BMI.  Estimated Nutritional Needs:   Kcal:  1350-1550  Protein:  60-70  grams  Fluid:  >/= 1.5 L/day  EDUCATION NEEDS:   No education needs identified at this time  Corrin Parker, MS, RD, LDN Pager # [phone] After hours/ weekend pager # [phone]

## 2015-12-05 NOTE — Anesthesia Procedure Notes (Signed)
Procedure Name: Intubation Date/Time: 12/05/2015 8:15 PM Performed by: Trixie Deis A Pre-anesthesia Checklist: Patient identified, Emergency Drugs available, Suction available, Patient being monitored and Timeout performed Patient Re-evaluated:Patient Re-evaluated prior to inductionOxygen Delivery Method: Circle system utilized Preoxygenation: Pre-oxygenation with 100% oxygen Intubation Type: IV induction Ventilation: Mask ventilation without difficulty Laryngoscope Size: Mac and 4 Grade View: Grade I Tube type: Oral Tube size: 7.5 mm Number of attempts: 1 Airway Equipment and Method: Stylet Placement Confirmation: ETT inserted through vocal cords under direct vision and positive ETCO2 Secured at: 23 cm Tube secured with: Tape Dental Injury: Teeth and Oropharynx as per pre-operative assessment

## 2015-12-06 ENCOUNTER — Encounter (HOSPITAL_COMMUNITY): Payer: Self-pay | Admitting: Orthopedic Surgery

## 2015-12-06 LAB — URINE CULTURE: CULTURE: NO GROWTH

## 2015-12-06 LAB — CBC
HCT: 28.6 % — ABNORMAL LOW (ref 39.0–52.0)
HEMATOCRIT: 33.4 % — AB (ref 39.0–52.0)
HEMOGLOBIN: 11.1 g/dL — AB (ref 13.0–17.0)
HEMOGLOBIN: 9.3 g/dL — AB (ref 13.0–17.0)
MCH: 30.4 pg (ref 26.0–34.0)
MCH: 30 pg (ref 26.0–34.0)
MCHC: 33.2 g/dL (ref 30.0–36.0)
MCHC: 32.5 g/dL (ref 30.0–36.0)
MCV: 91.5 fL (ref 78.0–100.0)
MCV: 92.3 fL (ref 78.0–100.0)
PLATELETS: 99 10*3/uL — AB (ref 150–400)
Platelets: 112 10*3/uL — ABNORMAL LOW (ref 150–400)
RBC: 3.65 MIL/uL — AB (ref 4.22–5.81)
RBC: 3.1 MIL/uL — AB (ref 4.22–5.81)
RDW: 13.3 % (ref 11.5–15.5)
RDW: 13.5 % (ref 11.5–15.5)
WBC: 8 10*3/uL (ref 4.0–10.5)
WBC: 7.2 10*3/uL (ref 4.0–10.5)

## 2015-12-06 MED ORDER — ENOXAPARIN SODIUM 30 MG/0.3ML ~~LOC~~ SOLN
30.0000 mg | SUBCUTANEOUS | Status: AC
Start: 2015-12-06 — End: ?

## 2015-12-06 MED ORDER — TRAMADOL-ACETAMINOPHEN 37.5-325 MG PO TABS: 1.0000 | ORAL_TABLET | Freq: Four times a day (QID) | ORAL | Status: DC | PRN

## 2015-12-06 NOTE — Clinical Social Work Placement (Signed)
   CLINICAL SOCIAL WORK PLACEMENT  NOTE  Date:  12/06/2015  Patient Details  Name: Corey Walker MRN: [id number] Date of Birth: Nov 03, 1917  Clinical Social Work is seeking post-discharge placement for this patient at the [place] level of care (*CSW will initial, date and re-position this form in  chart as items are completed):  Yes   Patient/family provided with [facility] Work Department's list of facilities offering this level of care within the geographic area requested by the patient (or if unable, by the patient's family).  Yes   Patient/family informed of their freedom to choose among providers that offer the needed level of care, that participate in Medicare, Medicaid or managed care program needed by the patient, have an available bed and are willing to accept the patient.  Yes   Patient/family informed of [facility]'s ownership interest in [facility] and [facility], as well as of the fact that they are under no obligation to receive care at these facilities.  PASRR submitted to EDS on       PASRR number received on       Existing PASRR number confirmed on 12/06/15     FL2 transmitted to all facilities in geographic area requested by pt/family on 12/06/15     FL2 transmitted to all facilities within larger geographic area on       Patient informed that his/her managed care company has contracts with or will negotiate with certain facilities, including the following:            Patient/family informed of bed offers received.  Patient chooses bed at       Physician recommends and patient chooses bed at      Patient to be transferred to   on  .  Patient to be transferred to facility by       Patient family notified on   of transfer.  Name of family member notified:        PHYSICIAN Please sign FL2     Additional Comment:    _______________________________________________ Caroline Sauger, LCSW 12/06/2015, 12:47  PM

## 2015-12-06 NOTE — Evaluation (Signed)
Physical Therapy Evaluation Patient Details Name: Corey Walker MRN: [id number] DOB: 1917-06-17 Today's Date: 12/06/2015   History of Present Illness  Corey Walker is a 82 y.o. male with a past medical history significant for AAA s/p repair in 2015, hypothyroidism, PMR on prednisone 1 mg/day, large left inguinal hernia and CKD who presents with hip pain after a fall. Pt underwent L hip hemiarthroplasty 7/10.  Clinical Impression  Pt is an elderly man with increased L hip pain from fall and surgery and adamant about only doing what he can do as "i am a very old man, i'm a 82 years old"  Pt was amb indep with RW PTA but now can only get to EOB. Pt to benefit from SNF upon d/c for maximal functional recovery to return to ALF apartment.    Follow Up Recommendations SNF;Supervision/Assistance - 24 hour    Equipment Recommendations  None recommended by PT (TBD by next venue)    Recommendations for Other Services       Precautions / Restrictions Precautions Precautions: Fall Restrictions Weight Bearing Restrictions: Yes LLE Weight Bearing: Weight bearing as tolerated      Mobility  Bed Mobility Overal bed mobility: Needs Assistance;+2 for physical assistance Bed Mobility: Supine to Sit;Sit to Supine     Supine to sit: Max assist;+2 for physical assistance Sit to supine: Max assist;+2 for physical assistance   General bed mobility comments: used helicopter technique, pt attempted to assist with UEs however dependent with LE management,   Transfers                 General transfer comment: deferred due to pt "i can't stand, people won't listen to me, I am 82 years old and have lots of aches and pain and no understands that." pt educated on importance of mobility to decrease risk of PNA and blood clots pt adamant about not standing, respected pt's wishes  Ambulation/Gait                Stairs            Wheelchair Mobility    Modified Rankin (Stroke  Patients Only)       Balance Overall balance assessment: History of Falls;Needs assistance (pt with recent fall leading him to this hospitalization) Sitting-balance support: Feet supported;Bilateral upper extremity supported Sitting balance-Leahy Scale: Poor Sitting balance - Comments: posterior lean, with onset of fatigue                                     Pertinent Vitals/Pain Pain Assessment: Faces Faces Pain Scale: Hurts even more Pain Location: Lhip with movement Pain Intervention(s): Monitored during session    Home Living Family/patient expects to be discharged to:: Skilled nursing facility                 Additional Comments: [facility] [place]    Prior Function Level of Independence: Independent with assistive device(s)         Comments: pt was amb with RW into dining hall daily for all meals, pt reported doing all own bathing and dressing     Hand Dominance   Dominant Hand: Right    Extremity/Trunk Assessment   Upper Extremity Assessment: Generalized weakness           Lower Extremity Assessment: LLE deficits/detail;Generalized weakness;RLE deficits/detail RLE Deficits / Details: generalized weakness LLE Deficits / Details: minimal voluntary  mvmt due to pain at hip  Cervical / Trunk Assessment: Kyphotic  Communication   Communication: HOH  Cognition Arousal/Alertness: Awake/alert Behavior During Therapy:  (anxious re: falling) Overall Cognitive Status: Within Functional Limits for tasks assessed                      General Comments General comments (skin integrity, edema, etc.): pt with multiple skin tares and bruising    Exercises        Assessment/Plan    PT Assessment Patient needs continued PT services  PT Diagnosis Difficulty walking;Abnormality of gait;Generalized weakness;Acute pain   PT Problem List Decreased strength;Decreased range of motion;Decreased activity tolerance;Decreased  balance;Decreased safety awareness;Decreased mobility;Decreased knowledge of use of DME;Pain  PT Treatment Interventions DME instruction;Gait training;Stair training;Functional mobility training;Therapeutic activities;Therapeutic exercise;Balance training   PT Goals (Current goals can be found in the Care Plan section) Acute Rehab PT Goals Patient Stated Goal: stop the pain PT Goal Formulation: With patient Time For Goal Achievement: 12/20/15 Potential to Achieve Goals: Fair    Frequency Min 4X/week   Barriers to discharge        Co-evaluation               End of Session Equipment Utilized During Treatment: Gait belt Activity Tolerance: Patient limited by fatigue;Patient limited by pain Patient left: in bed;with call bell/phone within reach;with bed alarm set Nurse Communication: Mobility status         Time: [id number] PT Time Calculation (min) (ACUTE ONLY): 24 min   Charges:   PT Evaluation $PT Eval Moderate Complexity: 1 Procedure PT Treatments $Therapeutic Activity: 8-22 mins   PT G CodesKingsley Callander 12/06/2015, 9:04 AM   Kittie Plater, PT, DPT Pager #: [phone] Office #: [phone]

## 2015-12-06 NOTE — Progress Notes (Signed)
Patient ID: Corey Walker, male   DOB: 10-25-1917, 82 y.o.   MRN: [id number] Subjective: 1 Day Post-Op Procedure(s) (LRB): ARTHROPLASTY BIPOLAR HIP (HEMIARTHROPLASTY) (Left)    Patient asleep.  Reviewed course with daughter and son in law.  Working on discharge plans  Objective:   VITALS:   Filed Vitals:   12/06/15 0600 12/06/15 1503  BP: 109/64 95/55  Pulse: 80 71  Temp:  98.6 F (37 C)  Resp:  16    Incision: dressing C/D/I  LABS  Recent Labs  12/05/15 0146 12/05/15 2243 12/06/15 0258  HGB 11.7* 11.1* 9.3*  HCT 34.9* 33.4* 28.6*  WBC 8.5 8.0 7.2  PLT 132* 112* 99*     Recent Labs  12/04/15 2215 12/05/15 0146 12/05/15 2243  NA 139 138  --   K 4.0 4.0  --   BUN 27* 25*  --   CREATININE 1.31* 1.26* 1.03  GLUCOSE 117* 117*  --     No results for input(s): LABPT, INR in the last 72 hours.   Assessment/Plan: 1 Day Post-Op Procedure(s) (LRB): ARTHROPLASTY BIPOLAR HIP (HEMIARTHROPLASTY) (Left)   Advance diet Up with therapy Discharge to SNF most likell  RTC in 2 weeks WBAT LLE lovenox for DVT proph for 2 weeks

## 2015-12-06 NOTE — NC FL2 (Signed)
[place] MEDICAID FL2 LEVEL OF CARE SCREENING TOOL     IDENTIFICATION  Patient Name: Corey Walker Birthdate: 02-29-1918 Sex: male Admission Date (Current Location): 12/04/2015  [facility] and [place] Number:  Herbalist and Address:  [facility]. [facility], [place] [address] [place], [place] [address]      Provider Number: [id number]  Attending Physician Name and Address:  Mendel Corning, MD  Relative Name and Phone Number:       Current Level of Care: Hospital Recommended Level of Care: [place] Prior Approval Number:    Date Approved/Denied:   PASRR Number: [id number]  Discharge Plan: SNF    Current Diagnoses: Patient Active Problem List   Diagnosis Date Noted  . Closed left hip fracture ([place]) 12/05/2015  . Protein-calorie malnutrition, severe ([place]) 12/05/2015  . Fall   . Skin neoplasm 07/19/2015  . Preoperative clearance 04/21/2014  . CKD (chronic kidney disease), stage III 04/12/2014  . Unilateral inguinal hernia without obstruction 04/11/2014  . Pancreatitis 05/19/2013  . Transaminitis 05/19/2013  . Hypokalemia 05/19/2013  . Hypothyroidism 07/15/2011  . AAA (abdominal aortic aneurysm) ([place]) 07/15/2011  . Obstructive jaundice 07/12/2011  . Essential hypertension 07/12/2011  . Polymyalgia rheumatica ([place]) 07/12/2011  . H/O colon cancer, stage I 07/12/2011    Orientation RESPIRATION BLADDER Height & Weight     Self  O2 Continent Weight:   Height:     BEHAVIORAL SYMPTOMS/MOOD NEUROLOGICAL BOWEL NUTRITION STATUS      Continent Diet (Please see discharge summary.)  AMBULATORY STATUS COMMUNICATION OF NEEDS Skin   Extensive Assist Verbally Surgical wounds, PU Stage and Appropriate Care PU Stage 1 Dressing: No Dressing                     Personal Care Assistance Level of Assistance  Bathing, Feeding, Dressing Bathing Assistance: Maximum assistance Feeding assistance: Limited assistance Dressing Assistance:  Maximum assistance     Functional Limitations Info             SPECIAL CARE FACTORS FREQUENCY  PT (By licensed PT), OT (By licensed OT)     PT Frequency: 5 OT Frequency: 5            Contractures      Additional Factors Info  Code Status, Allergies Code Status Info: DNR Allergies Info: Loratadine, Penicillins, Darvocet, Fish Oil, Lactose Intolerance (Gi)           Current Medications (12/06/2015):  This is the current hospital active medication list Current Facility-Administered Medications  Medication Dose Route Frequency Provider Last Rate Last Dose  . 0.9 %  sodium chloride infusion   Intravenous Continuous Ripudeep K Rai, MD 100 mL/hr at 12/06/15 1045    . bisacodyl (DULCOLAX) suppository 10 mg  10 mg Rectal Daily PRN Edwin Dada, MD      . budesonide (PULMICORT) nebulizer solution 0.25 mg  0.25 mg Inhalation BID Edwin Dada, MD   0.25 mg at 12/06/15 0830  . docusate sodium (COLACE) capsule 100 mg  100 mg Oral BID Edwin Dada, MD   100 mg at 12/05/15 0845  . enoxaparin (LOVENOX) injection 30 mg  30 mg Subcutaneous Q24H Danae Orleans, PA-C   30 mg at 12/06/15 0755  . feeding supplement (ENSURE ENLIVE) (ENSURE ENLIVE) liquid 237 mL  237 mL Oral BID BM Ripudeep K Rai, MD      . ferrous sulfate tablet 325 mg  325 mg Oral BID WC  Edwin Dada, MD   325 mg at 12/06/15 0758  . HYDROcodone-acetaminophen (NORCO/VICODIN) 5-325 MG per tablet 1-2 tablet  1-2 tablet Oral Q6H PRN Edwin Dada, MD   2 tablet at 12/06/15 1141  . levothyroxine (SYNTHROID, LEVOTHROID) tablet 75 mcg  75 mcg Oral QAC breakfast Edwin Dada, MD   75 mcg at 12/06/15 0758  . menthol-cetylpyridinium (CEPACOL) lozenge 3 mg  1 lozenge Oral PRN Danae Orleans, PA-C       Or  . phenol (CHLORASEPTIC) mouth spray 1 spray  1 spray Mouth/Throat PRN Danae Orleans, PA-C      . metoCLOPramide (REGLAN) tablet 5-10 mg  5-10 mg Oral Q8H PRN Danae Orleans, PA-C        Or  . metoCLOPramide (REGLAN) injection 5-10 mg  5-10 mg Intravenous Q8H PRN Danae Orleans, PA-C      . morphine 2 MG/ML injection 0.5 mg  0.5 mg Intravenous Q2H PRN Edwin Dada, MD      . ondansetron [facility]) tablet 4 mg  4 mg Oral Q6H PRN Danae Orleans, PA-C       Or  . ondansetron [facility]) injection 4 mg  4 mg Intravenous Q6H PRN Danae Orleans, PA-C      . polyethylene glycol (MIRALAX / GLYCOLAX) packet 17 g  17 g Oral Daily PRN Edwin Dada, MD      . predniSONE (DELTASONE) tablet 1 mg  1 mg Oral Q breakfast Edwin Dada, MD   1 mg at 12/06/15 0756     Discharge Medications: Please see discharge summary for a list of discharge medications.  Relevant Imaging Results:  Relevant Lab Results:   Additional Information SSN: [id number]  Caroline Sauger, LCSW

## 2015-12-06 NOTE — Clinical Social Work Note (Signed)
Clinical Social Work Assessment  Patient Details  Name: Corey Walker MRN: [id number] Date of Birth: [birth year]  Date of referral:  12/06/15               Reason for consult:  Facility Placement, Discharge Planning                Permission sought to share information with:  Facility Sport and exercise psychologist, Family Supports Permission granted to share information::  Yes, Verbal Permission Granted  Name::     Astronomer::  [facility]  Relationship::  Daughter  Contact Information:  [phone]  Housing/Transportation Living arrangements for the past 2 months:  [place] ([place]) Source of Information:  Adult Children Patient Interpreter Needed:  None Criminal Activity/Legal Involvement Pertinent to Current Situation/Hospitalization:  No - Comment as needed Significant Relationships:  Adult Children Lives with:  Facility Resident Do you feel safe going back to the place where you live?  Yes Need for family participation in patient care:  Yes (Comment) (Patient's daughter active in care.)  Care giving concerns:  Patient's daughter expressed no concerns at this time.   Social Worker assessment / plan:  LCSW received referral for possible SNF placement at time of discharge. LCSW spoke with patient's daughter regarding discharge plan. Patient's daughter agreeable to SNF placement at time of discharge. LCSW to continue to follow and assist with discharge planning needs.  Employment status:  Retired Science writer) PT Recommendations:  [place] / Referral to community resources:  [place]  Patient/Family's Response to care:  Patient's daughter understanding and agreeable to [facility] plan of care.  Patient/Family's Understanding of and Emotional Response to Diagnosis, Current Treatment, and Prognosis:  Patient's daughter understanding and agreeable to LCSW  plan of care.  Emotional Assessment Appearance:  Other (Comment Required (LCSW spoke with patient's daughter.) Attitude/Demeanor/Rapport:   (LCSW spoke with patient's daughter.) Affect (typically observed):   (LCSW spoke with patient's daughter.) Orientation:  Oriented to Self Alcohol / Substance use:  Not Applicable Psych involvement (Current and /or in the community):  No (Comment) (Not appropriate on this admission.)  Discharge Needs  Concerns to be addressed:  No discharge needs identified Readmission within the last 30 days:  No Current discharge risk:  None Barriers to Discharge:  No Barriers Identified   Caroline Sauger, LCSW 12/06/2015, 12:57 PM

## 2015-12-06 NOTE — Progress Notes (Signed)
   The patient is clinically stable status post left hip surgery for fracture.  No cardiovascular complications occurred. Patient is asymptomatic.  Please call of we may be of further assistance.

## 2015-12-06 NOTE — Progress Notes (Addendum)
Perineal swelling noted to have increased on left side. Non tender, no bruising/discoloration in front or back, VSS, dsg c/d/i, and pt without c/o pain at site. Neurovascular checks unchanged. Charge nurse Apolonio Schneiders brought in to assess. No signs of active bleeding, pt was swollen at baseline in perineal area. Pt has known hx of left inguinal herina Will continue to monitor closely. Eleonore Chiquito RN

## 2015-12-06 NOTE — Progress Notes (Signed)
[facility]                                                                              Patient Demographics  Corey Walker, is a 82 y.o. male, DOB - 06-Feb-1918, [id number]  Admit date - 12/04/2015   Admitting Physician Edwin Dada, MD  Outpatient Primary MD for the patient is Kenn File, MD  Outpatient specialists:   LOS - 1  days    Chief Complaint  Patient presents with  . Hip Injury       Brief summary  Patient is a 82 year old male with history significant for AAA s/p repair in 2015, hypothyroidism, PMR on prednisone 1 mg/day, large left inguinal hernia and CKD who presents with hip pain after a fall. The patient was in his normal state of health until this evening when he was walking in the bathroom, suddenly "just fell" and had immediate severe LEFT hip pain and could not walk.There was no preceding weakness, chest discomfort, palpitations, or malaise, nor are there any of those symptoms now.The patient has no active heart disease, angina, or history of MI. He walks with a four wheeled walker, three times a day to the cafeteria but is otherwise sedentary and cannot exert to an equivalent of 4 METS.     Assessment & Plan    Principal Problem:   Closed left hip fracture ([place]) - Status post left hip hemiarthroplasty, postop day #1 - cont pain control, gentle hydration - Management per orthopedics, physical therapy recommending skilled nursing facility - Swallow evaluation  COPD  - Currently stable, no wheezing     Hypothyroidism: -Continue levothyroxine   PMR: Tapering off 2 years prednisone this spring.  -Continue prednisone 1 mg daily (to be finished 7/27) -Monitor for hypotension post-operatively   Inguinal hernia: Chronic, not incarcerated clinically   CKD with hyperchloremic metabolic acidosis: Creatinine slightly above baseline 1.1 mg/dL, BUN elevated. -Trend BMP post-operatively - Gentle hydration   Chronic  normocytic anemia: Currently at baseline, monitor H&H   Severe end-stage protein calorie malnutrition: -Continue Ensure  Essential hypertension: -Hold lisinopril and furosemide pre-operatively, restart after -Furosemide is every other day  Prolonged QTc: -Repeat ECG for better baseline  Acute encephalopathy on dementia PTO valuation recommending skilled nursing facility  Code Status: DO NOT RESUSCITATE  DVT Prophylaxis:  SCD's Family Communication: Discussed in detail with the patient, all imaging results, lab results explained to the patient   Disposition Plan: Pending orthopedic evaluation  Time Spent in minutes   25 minutes  Procedures:  Left hip hemiarthroplasty  Consultants:   Ortho cardiology  Antimicrobials:      Medications  Scheduled Meds: . budesonide  0.25 mg Inhalation BID  . docusate sodium  100 mg Oral BID  . enoxaparin (LOVENOX) injection  30 mg Subcutaneous Q24H  . feeding supplement (ENSURE ENLIVE)  237 mL Oral BID BM  . ferrous sulfate  325 mg Oral BID WC  . levothyroxine  75 mcg Oral QAC breakfast  . predniSONE  1 mg Oral Q breakfast  . vancomycin  1,000 mg Intravenous Q12H   Continuous Infusions: . sodium chloride 100 mL/hr at  12/06/15 1045   PRN Meds:.bisacodyl, HYDROcodone-acetaminophen, menthol-cetylpyridinium **OR** phenol, metoCLOPramide **OR** metoCLOPramide (REGLAN) injection, morphine injection, ondansetron **OR** ondansetron (ZOFRAN) IV, polyethylene glycol   Antibiotics   Anti-infectives    Start     Dose/Rate Route Frequency Ordered Stop   12/06/15 1200  vancomycin (VANCOCIN) IVPB 1000 mg/200 mL premix     1,000 mg 200 mL/hr over 60 Minutes Intravenous Every 12 hours 12/05/15 2224 12/06/15 2359   12/05/15 1959  vancomycin (VANCOCIN) 1-5 GM/200ML-% IVPB    Comments:  Haynes Bast   : cabinet override      12/05/15 1959 12/05/15 2104        Subjective:   Corey Walker was seen and examined today.  Somewhat confused  this morning, postop day #1. No fevers or chills. No nausea, vomiting, wheezing.   Objective:   Filed Vitals:   12/06/15 0400 12/06/15 0500 12/06/15 0600 12/06/15 0830  BP: 91/51 94/62 109/64   Pulse: 73 71 80   Temp:      TempSrc:      Resp:      SpO2: 100% 100% 96% 100%    Intake/Output Summary (Last 24 hours) at 12/06/15 1204 Last data filed at 12/06/15 0600  Gross per 24 hour  Intake   1300 ml  Output   1525 ml  Net   -225 ml     Wt Readings from Last 3 Encounters:  08/22/15 53.434 kg (117 lb 12.8 oz)  07/19/15 49.896 kg (110 lb)  05/11/15 51.313 kg (113 lb 2 oz)     Exam  General: Alert and oriented x One, confused  HEENT:    Neck:   Cardiovascular: S1 S2 auscultated, no rubs, murmurs or gallops. Regular rate and rhythm.  Respiratory: Clear to auscultation bilaterally, no wheezing, rales or rhonchi  Gastrointestinal: Soft, nontender, nondistended, + bowel sounds  Ext: no cyanosis clubbing or edema  Neuro:   Skin: No rashes  Psych: slightly confused, alert and oriented 1 to self  Data Reviewed:  I have personally reviewed following labs and imaging studies  Micro Results Recent Results (from the past 240 hour(s))  Surgical PCR screen     Status: None   Collection Time: 12/05/15  2:02 AM  Result Value Ref Range Status   MRSA, PCR NEGATIVE NEGATIVE Final   Staphylococcus aureus NEGATIVE NEGATIVE Final    Comment:        The Xpert SA Assay (FDA approved for NASAL specimens in patients over 11 years of age), is one component of a comprehensive surveillance program.  Test performance has been validated by [facility] for patients greater than or equal to 44 year old. It is not intended to diagnose infection nor to guide or monitor treatment.     Radiology Reports Dg Chest 1 View  12/04/2015  CLINICAL DATA:  82 year old male with fall EXAM: CHEST 1 VIEW COMPARISON:  Chest radiograph dated 05/25/2014 FINDINGS: Single-view of the chest  demonstrates emphysematous changes of the lungs. No focal consolidation, pleural effusion, or pneumothorax. The cardiac silhouette is within normal limits. The aorta is tortuous. There is osteopenia with degenerative changes of the spine. No acute fracture. IMPRESSION: No active disease. Electronically Signed   By: Anner Crete M.D.   On: 12/04/2015 23:31   Ct Head Wo Contrast  12/04/2015  CLINICAL DATA:  Unwitnessed fall.  Left hip pain. EXAM: CT HEAD WITHOUT CONTRAST CT CERVICAL SPINE WITHOUT CONTRAST TECHNIQUE: Multidetector CT imaging of the head and cervical spine was performed following  the standard protocol without intravenous contrast. Multiplanar CT image reconstructions of the cervical spine were also generated. COMPARISON:  CT head 11/17/2008 FINDINGS: CT HEAD FINDINGS Diffuse cerebral atrophy. Low-attenuation changes throughout the deep white matter consistent small vessel ischemia. Mild ventricular dilatation consistent with central atrophy. Small hyperdense extra-axial lesion along the left side of the upper falx measuring about 9 mm diameter. This is unchanged since previous study and probably represents a small calcified meningioma. No mass effect or midline shift. No abnormal extra-axial fluid collections. Gray-white matter junctions are distinct. Basal cisterns are not effaced. No evidence of acute intracranial hemorrhage. No depressed skull fractures. Visualized paranasal sinuses and mastoid air cells are not opacified. Vascular calcifications. CT CERVICAL SPINE FINDINGS Normal alignment of the cervical spine. Degenerative changes throughout the cervical spine with narrowed interspaces and associated endplate hypertrophic changes. No vertebral compression deformities. No prevertebral soft tissue swelling. C1-2 articulation appears intact. No focal bone lesion or bone destruction. Vascular calcifications. The upper chest demonstrates apical pleural thickening and scarring with mild  bronchiectasis. Secretions within the trachea. Dilated aortic arch with diameter of about 3.7 cm, incompletely included within the field of view. IMPRESSION: No acute intracranial abnormalities. Chronic atrophy and small vessel ischemic changes. Small left para fall seen calcified meningioma. Normal alignment of the cervical spine with diffuse degenerative changes. No acute displaced fractures identified. Electronically Signed   By: Lucienne Capers M.D.   On: 12/04/2015 23:44   Ct Cervical Spine Wo Contrast  12/04/2015  CLINICAL DATA:  Unwitnessed fall.  Left hip pain. EXAM: CT HEAD WITHOUT CONTRAST CT CERVICAL SPINE WITHOUT CONTRAST TECHNIQUE: Multidetector CT imaging of the head and cervical spine was performed following the standard protocol without intravenous contrast. Multiplanar CT image reconstructions of the cervical spine were also generated. COMPARISON:  CT head 11/17/2008 FINDINGS: CT HEAD FINDINGS Diffuse cerebral atrophy. Low-attenuation changes throughout the deep white matter consistent small vessel ischemia. Mild ventricular dilatation consistent with central atrophy. Small hyperdense extra-axial lesion along the left side of the upper falx measuring about 9 mm diameter. This is unchanged since previous study and probably represents a small calcified meningioma. No mass effect or midline shift. No abnormal extra-axial fluid collections. Gray-white matter junctions are distinct. Basal cisterns are not effaced. No evidence of acute intracranial hemorrhage. No depressed skull fractures. Visualized paranasal sinuses and mastoid air cells are not opacified. Vascular calcifications. CT CERVICAL SPINE FINDINGS Normal alignment of the cervical spine. Degenerative changes throughout the cervical spine with narrowed interspaces and associated endplate hypertrophic changes. No vertebral compression deformities. No prevertebral soft tissue swelling. C1-2 articulation appears intact. No focal bone lesion or  bone destruction. Vascular calcifications. The upper chest demonstrates apical pleural thickening and scarring with mild bronchiectasis. Secretions within the trachea. Dilated aortic arch with diameter of about 3.7 cm, incompletely included within the field of view. IMPRESSION: No acute intracranial abnormalities. Chronic atrophy and small vessel ischemic changes. Small left para fall seen calcified meningioma. Normal alignment of the cervical spine with diffuse degenerative changes. No acute displaced fractures identified. Electronically Signed   By: Lucienne Capers M.D.   On: 12/04/2015 23:44   Pelvis Portable  12/05/2015  CLINICAL DATA:  Status post left hip hemi arthroplasty. EXAM: PORTABLE PELVIS 1-2 VIEWS COMPARISON:  None. FINDINGS: There has been a left hemi arthroplasty with long stem femoral component. The prosthetic femoral head is well seated within the acetabulum. There is near anatomic alignment. Expected postsurgical soft tissue emphysema is seen. There is no evidence of  osseous fractures. Aorto by iliac stent graft is seen. Soft tissues otherwise grossly normal. IMPRESSION: Status post left hemiarthroplasty without evidence of immediate complications. Electronically Signed   By: Fidela Salisbury M.D.   On: 12/05/2015 22:09   Dg Hand Complete Left  12/05/2015  CLINICAL DATA:  Ecchymosis and skin tears on the thumb after a fall. EXAM: LEFT HAND - COMPLETE 3+ VIEW COMPARISON:  None. FINDINGS: Degenerative changes in the interphalangeal joints, first carpometacarpal and metacarpal phalangeal joints, STT joints, and radiocarpal joints of the left hand and wrist. No evidence of acute fracture or dislocation. No soft tissue foreign bodies. IMPRESSION: Diffuse degenerative changes in the left hand and wrist. Electronically Signed   By: Lucienne Capers M.D.   On: 12/05/2015 00:32   Dg Hip Unilat With Pelvis 2-3 Views Left  12/04/2015  CLINICAL DATA:  82 year old male with fall and left hip pain.  EXAM: DG HIP (WITH OR WITHOUT PELVIS) 2-3V LEFT COMPARISON:  CT of the abdomen pelvis dated 11/25/2014 FINDINGS: There is a mildly displaced transverse fracture of the left femoral neck. There is mild proximal migration and impaction of the femoral shaft. The head of the femur remains in anatomic alignment within the acetabulum. No other acute fracture identified. The bones are osteopenic. An aorta bi-iliac endovascular stent graft is partially visualized. Postsurgical changes of bowel resection with anastomotic suture in the right lower quadrant. Air within the urinary bladder likely introduced via catheterization. IMPRESSION: Mildly impacted fracture of the left femoral neck. Electronically Signed   By: Anner Crete M.D.   On: 12/04/2015 23:33   Dg Femur 1v Left  12/04/2015  CLINICAL DATA:  Left hip pain after fall in the shower. EXAM: LEFT FEMUR 1 VIEW COMPARISON:  None. FINDINGS: Views obtained including the mid and distal shaft of the left femur demonstrate no acute fracture or dislocation in the visualized bones. Degenerative changes in the left knee. Vascular calcifications. IMPRESSION: No fractures demonstrated in the visualized midshaft and distal left femur. Electronically Signed   By: Lucienne Capers M.D.   On: 12/04/2015 23:30    Lab Data:  CBC:  Recent Labs Lab 12/04/15 2215 12/05/15 0146 12/05/15 2243 12/06/15 0258  WBC 9.2 8.5 8.0 7.2  NEUTROABS 7.0  --   --   --   HGB 12.2* 11.7* 11.1* 9.3*  HCT 36.8* 34.9* 33.4* 28.6*  MCV 90.2 90.4 91.5 92.3  PLT 149* 132* 112* 99*   Basic Metabolic Panel:  Recent Labs Lab 12/04/15 2215 12/05/15 0146 12/05/15 2243  NA 139 138  --   K 4.0 4.0  --   CL 113* 117*  --   CO2 19* 15*  --   GLUCOSE 117* 117*  --   BUN 27* 25*  --   CREATININE 1.31* 1.26* 1.03  CALCIUM 9.0 8.5*  --    GFR: CrCl cannot be calculated (Unknown ideal weight.). Liver Function Tests: No results for input(s): AST, ALT, ALKPHOS, BILITOT, PROT, ALBUMIN  in the last 168 hours. No results for input(s): LIPASE, AMYLASE in the last 168 hours. No results for input(s): AMMONIA in the last 168 hours. Coagulation Profile: No results for input(s): INR, PROTIME in the last 168 hours. Cardiac Enzymes:  Recent Labs Lab 12/05/15 1514  TROPONINI 0.05*   BNP (last 3 results) No results for input(s): PROBNP in the last 8760 hours. HbA1C: No results for input(s): HGBA1C in the last 72 hours. CBG: No results for input(s): GLUCAP in the last 168 hours. Lipid  Profile: No results for input(s): CHOL, HDL, LDLCALC, TRIG, CHOLHDL, LDLDIRECT in the last 72 hours. Thyroid Function Tests: No results for input(s): TSH, T4TOTAL, FREET4, T3FREE, THYROIDAB in the last 72 hours. Anemia Panel: No results for input(s): VITAMINB12, FOLATE, FERRITIN, TIBC, IRON, RETICCTPCT in the last 72 hours. Urine analysis:    Component Value Date/Time   COLORURINE YELLOW 12/05/2015 1658   APPEARANCEUR CLEAR 12/05/2015 1658   LABSPEC 1.017 12/05/2015 1658   PHURINE 5.0 12/05/2015 1658   GLUCOSEU NEGATIVE 12/05/2015 1658   HGBUR MODERATE* 12/05/2015 1658   BILIRUBINUR NEGATIVE 12/05/2015 1658   KETONESUR NEGATIVE 12/05/2015 1658   PROTEINUR NEGATIVE 12/05/2015 1658   UROBILINOGEN 0.2 05/25/2014 1500   NITRITE NEGATIVE 12/05/2015 1658   LEUKOCYTESUR NEGATIVE 12/05/2015 1658     RAI,RIPUDEEP M.D. [facility] 12/06/2015, 12:04 PM  Pager: [phone] Between 7am to 7pm - call Pager - [phone]  After 7pm go to [email or web address] - password [email or web address]  Call night coverage person covering after 7pm

## 2015-12-07 DIAGNOSIS — I10 Essential (primary) hypertension: Secondary | ICD-10-CM

## 2015-12-07 DIAGNOSIS — N183 Chronic kidney disease, stage 3 (moderate): Secondary | ICD-10-CM

## 2015-12-07 DIAGNOSIS — K4090 Unilateral inguinal hernia, without obstruction or gangrene, not specified as recurrent: Secondary | ICD-10-CM

## 2015-12-07 DIAGNOSIS — S72002A Fracture of unspecified part of neck of left femur, initial encounter for closed fracture: Principal | ICD-10-CM

## 2015-12-07 DIAGNOSIS — E43 Unspecified severe protein-calorie malnutrition: Secondary | ICD-10-CM

## 2015-12-07 DIAGNOSIS — E031 Congenital hypothyroidism without goiter: Secondary | ICD-10-CM

## 2015-12-07 DIAGNOSIS — L8990 Pressure ulcer of unspecified site, unspecified stage: Secondary | ICD-10-CM | POA: Insufficient documentation

## 2015-12-07 LAB — GLUCOSE, CAPILLARY
GLUCOSE-CAPILLARY: 93 mg/dL (ref 65–99)
GLUCOSE-CAPILLARY: 100 mg/dL — AB (ref 65–99)

## 2015-12-07 LAB — BASIC METABOLIC PANEL
ANION GAP: 4 — AB (ref 5–15)
BUN: 16 mg/dL (ref 6–20)
CHLORIDE: 116 mmol/L — AB (ref 101–111)
CO2: 19 mmol/L — AB (ref 22–32)
Calcium: 7.8 mg/dL — ABNORMAL LOW (ref 8.9–10.3)
Creatinine, Ser: 1.16 mg/dL (ref 0.61–1.24)
GFR calc non Af Amer: 51 mL/min — ABNORMAL LOW (ref >60)
GFR, EST AFRICAN AMERICAN: 59 mL/min — AB (ref >60)
Glucose, Bld: 112 mg/dL — ABNORMAL HIGH (ref 65–99)
POTASSIUM: 3.7 mmol/L (ref 3.5–5.1)
SODIUM: 139 mmol/L (ref 135–145)

## 2015-12-07 LAB — CBC
HEMATOCRIT: 30.1 % — AB (ref 39.0–52.0)
HEMOGLOBIN: 9.9 g/dL — AB (ref 13.0–17.0)
MCH: 30 pg (ref 26.0–34.0)
MCHC: 32.9 g/dL (ref 30.0–36.0)
MCV: 91.2 fL (ref 78.0–100.0)
Platelets: 111 10*3/uL — ABNORMAL LOW (ref 150–400)
RBC: 3.3 MIL/uL — ABNORMAL LOW (ref 4.22–5.81)
RDW: 13.5 % (ref 11.5–15.5)
WBC: 8.6 10*3/uL (ref 4.0–10.5)

## 2015-12-07 MED ORDER — RESOURCE THICKENUP CLEAR PO POWD
ORAL | Status: DC | PRN
  Filled 2015-12-07 (×2): qty 125

## 2015-12-07 NOTE — Care Management Important Message (Signed)
Important Message  Patient Details  Name: Corey Walker MRN: [id number] Date of Birth: 1917-10-22   Medicare Important Message Given:  Yes    Loann Quill 12/07/2015, 11:08 AM

## 2015-12-07 NOTE — Progress Notes (Signed)
[facility]  PROGRESS NOTE  Corey Walker [id number] DOB: 25-Feb-1918 DOA: 12/04/2015 PCP: Kenn File, MD    Brief HPI:  82 year old male with history significant for AAA s/p repair in 2015, hypothyroidism, PMR on prednisone 1 mg/day, large left inguinal hernia and CKD who presents with hip pain after a fall. The patient was in his normal state of health until this evening when he was walking in the bathroom, suddenly "just fell" and had immediate severe LEFT hip pain and could not walk.There was no preceding weakness, chest discomfort, palpitations, or malaise, nor are there any of those symptoms now.The patient has no active heart disease, angina, or history of MI. He walks with a four wheeled walker, three times a day to the cafeteria but is otherwise sedentary and cannot exert to an equivalent of 4 METS.    Principal Problem:   Closed left hip fracture ([facility]) Active Problems:   Essential hypertension   Hypothyroidism   AAA (abdominal aortic aneurysm) ([facility])   Unilateral inguinal hernia without obstruction   CKD (chronic kidney disease), stage III   Protein-calorie malnutrition, severe ([place])   Fall   Assessment/Plan: 1. Left hip fracture- status post left hip hemiarthroplasty, postop day #2. Orthopedic surgery following. Physical therapy recommending skilled nursing facility. Speech therapy evaluated and started patient on dysphagia 3 diet. 2. COPD- stable 3. Hypothyroidism-continue Synthroid 4. PMR-patient on tapering dose of prednisone, continue prednisone 1 mg by mouth daily to be completed on 12/22/2015 5. Chronic normocytic anemia- hemoglobin stable at 9.9. Follow CBC in a.m. 6. Severe pain calorie malnutrition- continue Ensure 7. Hypertension- BP soft, continue to hold  Lisinopril and Furosemide. 8. Dementia- without behavior disturbance. Stable.   DVT prophylaxis: Lovenox Code Status: DNR Family Communication: No family at bedside Disposition Plan:  SNF   Consultants:  Orthopedics  Procedures:  Left hip hemiarthroplasty  Antibiotics: Anti-infectives    Start   Dose/Rate Route Frequency Ordered Stop   12/06/15 1200  vancomycin (VANCOCIN) IVPB 1000 mg/200 mL premix    1,000 mg 200 mL/hr over 60 Minutes Intravenous Every 12 hours 12/05/15 2224 12/06/15 2359   12/05/15 1959  vancomycin (VANCOCIN) 1-5 GM/200ML-% IVPB   Comments: Haynes Bast : cabinet override      12/05/15 1959 12/05/15 2104         Subjective: Patient seen and examined, s/p left hemiarthroplasty  Objective: Filed Vitals:   12/06/15 1752 12/06/15 1937 12/06/15 2030 12/07/15 0500  BP: 96/57 89/48  91/40  Pulse: 71 54  83  Temp:  98.4 F (36.9 C)  97.7 F (36.5 C)  TempSrc:  Oral  Oral  Resp:      SpO2:  98% 95% 94%    Intake/Output Summary (Last 24 hours) at 12/07/15 1316 Last data filed at 12/07/15 0650  Gross per 24 hour  Intake 1917.08 ml  Output    600 ml  Net 1317.08 ml   There were no vitals filed for this visit.  Examination:  General exam: Appears calm and comfortable  Respiratory system: Clear to auscultation. Respiratory effort normal. Cardiovascular system: S1 & S2 heard, RRR. No JVD, murmurs, rubs, gallops or clicks. No pedal edema. Gastrointestinal system: Abdomen is nondistended, soft and nontender. No organomegaly or masses felt. Normal bowel sounds heard. Central nervous system: Alert and oriented. No focal neurological deficits. Extremities: Symmetric 5 x 5 power. Skin: No rashes, lesions or ulcers Psychiatry: Judgement and insight appear normal. Mood & affect appropriate.    Data Reviewed: I have  personally reviewed following labs and imaging studies Basic Metabolic Panel:  Recent Labs Lab 12/04/15 2215 12/05/15 0146 12/05/15 2243 12/07/15 0258  NA 139 138  --  139  K 4.0 4.0  --  3.7  CL 113* 117*  --  116*  CO2 19* 15*  --  19*  GLUCOSE 117* 117*  --  112*  BUN 27*  25*  --  16  CREATININE 1.31* 1.26* 1.03 1.16  CALCIUM 9.0 8.5*  --  7.8*   CBC:  Recent Labs Lab 12/04/15 2215 12/05/15 0146 12/05/15 2243 12/06/15 0258 12/07/15 0258  WBC 9.2 8.5 8.0 7.2 8.6  NEUTROABS 7.0  --   --   --   --   HGB 12.2* 11.7* 11.1* 9.3* 9.9*  HCT 36.8* 34.9* 33.4* 28.6* 30.1*  MCV 90.2 90.4 91.5 92.3 91.2  PLT 149* 132* 112* 99* 111*   Cardiac Enzymes:  Recent Labs Lab 12/05/15 1514  TROPONINI 0.05*    CBG:  Recent Labs Lab 12/07/15 1204  GLUCAP 93    Recent Results (from the past 240 hour(s))  Surgical PCR screen     Status: None   Collection Time: 12/05/15  2:02 AM  Result Value Ref Range Status   MRSA, PCR NEGATIVE NEGATIVE Final   Staphylococcus aureus NEGATIVE NEGATIVE Final    Comment:        The Xpert SA Assay (FDA approved for NASAL specimens in patients over 8 years of age), is one component of a comprehensive surveillance program.  Test performance has been validated by [facility] for patients greater than or equal to 19 year old. It is not intended to diagnose infection nor to guide or monitor treatment.   Urine culture     Status: None   Collection Time: 12/05/15  4:59 PM  Result Value Ref Range Status   Specimen Description URINE, CATHETERIZED  Final   Special Requests NONE  Final   Culture NO GROWTH  Final   Report Status 12/06/2015 FINAL  Final     Studies: Pelvis Portable  12/05/2015  CLINICAL DATA:  Status post left hip hemi arthroplasty. EXAM: PORTABLE PELVIS 1-2 VIEWS COMPARISON:  None. FINDINGS: There has been a left hemi arthroplasty with long stem femoral component. The prosthetic femoral head is well seated within the acetabulum. There is near anatomic alignment. Expected postsurgical soft tissue emphysema is seen. There is no evidence of osseous fractures. Aorto by iliac stent graft is seen. Soft tissues otherwise grossly normal. IMPRESSION: Status post left hemiarthroplasty without evidence of immediate  complications. Electronically Signed   By: Fidela Salisbury M.D.   On: 12/05/2015 22:09    Scheduled Meds: . budesonide  0.25 mg Inhalation BID  . docusate sodium  100 mg Oral BID  . enoxaparin (LOVENOX) injection  30 mg Subcutaneous Q24H  . feeding supplement (ENSURE ENLIVE)  237 mL Oral BID BM  . ferrous sulfate  325 mg Oral BID WC  . levothyroxine  75 mcg Oral QAC breakfast  . predniSONE  1 mg Oral Q breakfast   Continuous Infusions: . sodium chloride 75 mL/hr at 12/06/15 1305       Time spent: 25 min    [facility] Pager [phone]. If 7PM-7AM, please contact night-coverage at [email or web address], Office  [phone]  password [email or web address] 12/07/2015, 1:16 PM  LOS: 2 days

## 2015-12-07 NOTE — Progress Notes (Signed)
Physical Therapy Treatment Patient Details Name: Corey Walker MRN: [id number] DOB: 10/31/1917 Today's Date: 12/07/2015    History of Present Illness Corey Walker is a 82 y.o. male with a past medical history significant for AAA s/p repair in 2015, hypothyroidism, PMR on prednisone 1 mg/day, large left inguinal hernia and CKD who presents with hip pain after a fall. Pt underwent L hip hemiarthroplasty 7/10.    PT Comments    Pt performed increased mobility and required increased assist to achieve standing during treatment.  Pt would benefit from quiet environment next treatment to improve outcomes.  Pt loud and disturbing during session despite asking family to quiet down or leave during perianal care.  Pt progressing slow with therapy.    Follow Up Recommendations  SNF;Supervision/Assistance - 24 hour     Equipment Recommendations  None recommended by PT (TBD at next venue)    Recommendations for Other Services       Precautions / Restrictions Precautions Precautions: Fall Restrictions Weight Bearing Restrictions: Yes LLE Weight Bearing: Weight bearing as tolerated    Mobility  Bed Mobility Overal bed mobility: Needs Assistance Bed Mobility: Supine to Sit     Supine to sit: Total assist Sit to supine: Total assist   General bed mobility comments: used pad to pivot pt to sitting edge of bed, placed stedy frame in front of patient and with cross bar pt able to maintain balance at edge of bed.  Family loud and distracting in room, therefore difficult to assess what commands patient can hear during session.    Transfers Overall transfer level: Needs assistance   Transfers: Sit to/from Stand Sit to Stand: Max assist;+2 physical assistance;Total assist         General transfer comment: Pt performed sit to stand from elevated bed to stedy frame.  Pt required assist to facilitate hip extension and knee extension.  Pt required 3 trials before standing tall enough to place  stedy pad behind patients bottom.  Pt fatigued after standing trials and resting comfortably in chair.    Ambulation/Gait Ambulation/Gait assistance:  (unable and unsafe at this time.  pt too fatigued to even attempt or tolerate.  )               Stairs            Wheelchair Mobility    Modified Rankin (Stroke Patients Only)       Balance   Sitting-balance support: Feet supported Sitting balance-Leahy Scale: Poor Sitting balance - Comments: posterior lean, tolerated x 7 minutes                            Cognition Arousal/Alertness: Lethargic;Suspect due to medications Behavior During Therapy: Flat affect Overall Cognitive Status: Difficult to assess (Pt with baseline dementia per daughter.  )                      Exercises Total Joint Exercises Ankle Circles/Pumps: PROM;Left;10 reps;Supine Heel Slides: Left;10 reps;Supine;AAROM Hip ABduction/ADduction: AAROM;Left;10 reps;Supine;PROM    General Comments        Pertinent Vitals/Pain Pain Assessment: Faces Faces Pain Scale: Hurts whole lot Pain Location: L hip Pain Descriptors / Indicators: Grimacing;Guarding Pain Intervention(s): Monitored during session;Repositioned    Home Living Family/patient expects to be discharged to:: Skilled nursing facility               Additional Comments: pt lives in [facility] in [place]    Prior Function Level of Independence: Independent with assistive device(s)      Comments: pt was amb with RW into dining hall daily for all meals, pt reported doing all own bathing and dressing   PT Goals (current goals can now be found in the care plan section) Acute Rehab PT Goals Patient Stated Goal: did not state Potential to Achieve Goals: Poor Progress towards PT goals: Progressing toward goals    Frequency  Min 4X/week    PT Plan Current plan remains appropriate    Co-evaluation             End of Session Equipment Utilized During  Treatment: Gait belt Activity Tolerance: Patient limited by fatigue;Patient limited by pain Patient left: with call bell/phone within reach;in chair     Time: [id number] PT Time Calculation (min) (ACUTE ONLY): 36 min  Charges:  $Therapeutic Activity: 23-37 mins                    G Codes:      Cristela Blue 2015/12/23, 5:19 PM  Governor Rooks, PTA pager [place], PTA pager [phone]

## 2015-12-07 NOTE — Progress Notes (Signed)
Pt unable to void post foley removal. Bladder scan at 0045 showed ~315ml. First in and out cath done got out 320ml of concentrated urine. IVF running NS@75 . Bladder scan done this morning only showed ~165ml in bladder. Will report off to on coming nurse.

## 2015-12-07 NOTE — Progress Notes (Signed)
Patient unable to void.Patient Bladder Scan showed 200 ml at 15:00.

## 2015-12-07 NOTE — Evaluation (Signed)
Clinical/Bedside Swallow Evaluation Patient Details  Name: JIBRAN LEHL MRN: [id number] Date of Birth: 02/23/1918  Today's Date: 12/07/2015 Time: SLP Start Time (ACUTE ONLY): 0810 SLP Stop Time (ACUTE ONLY): 0840 SLP Time Calculation (min) (ACUTE ONLY): 30 min  Past Medical History:  Past Medical History  Diagnosis Date  . Hypertension   . Polymyalgia rheumatica ([place])   . Diverticulosis   . Thyroid disease   . Diarrhea   . Hypokalemia   . Colon cancer ([place]) 2001  . Skin cancer   . AAA (abdominal aortic aneurysm) ([facility])     5.5 x 5.5 cm in 2015  . Osteoporosis   . BPH (benign prostatic hyperplasia)   . Tinea unguium   . Temporal arteritis ([place])   . Obstructive jaundice     2013  . Pancreatitis     2013, 04/2013  . Inguinal hernia, left   . Hypothyroidism    Past Surgical History:  Past Surgical History  Procedure Laterality Date  . Colon surgery  2001    right colectomy for colon cancer  . Eus  07/13/2011    Dr. Carol Ada, inadequate conscious sedation. limited views of pancreas. No evidence of choledocholithiasis, CBD 90mm.  . Cholecystectomy    . Ercp N/A 05/21/2013    Procedure: ENDOSCOPIC RETROGRADE CHOLANGIOPANCREATOGRAPHY (ERCP) Sphincterotomy and stone extraction;  Surgeon: Rogene Houston, MD;  Location: [facility];  Service: Endoscopy;  Laterality: N/A;  . Eye surgery      cataracts removed from both eyes   . Abdominal aortic endovascular stent graft N/A 05/25/2014    Procedure: ABDOMINAL AORTIC ENDOVASCULAR STENT GRAFT;  Surgeon: Elam Dutch, MD;  Location: [facility] OR;  Service: Vascular;  Laterality: N/A;  . Hip arthroplasty Left 12/05/2015    Procedure: ARTHROPLASTY BIPOLAR HIP (HEMIARTHROPLASTY);  Surgeon: Paralee Cancel, MD;  Location: [facility];  Service: Orthopedics;  Laterality: Left;   HPI:  Patient is a 82 year old male with history significant for AAA s/p repair in 2015, hypothyroidism, PMR on prednisone 1 mg/day, large left inguinal hernia and CKD who  presents with closed left hip fx after a fall. The patient was in his normal state of health until this evening when he was walking in the bathroom, suddenly "just fell" and had immediate severe LEFT hip pain and could not walk. Underwent Left hip hemiarthroplasty on 12/05/15.    Assessment / Plan / Recommendation Clinical Impression  Pt demonstrates immediate and severe signs of aspiration with thin and nectar thick liquids. Pt is upright and alert, but requires total assistance for feeding and he is reluctant to grasp utensils and cups even with assist, reports pain in left UE. Pt demonstrates delayed swallow, multiple swallows, coughing, throat clearing, desaturation of O2 and belching with thin liquids and to a lesser degree, with nectar. Attempted regulating bolus size with some success. Pt managed solid textures well. Attempted to discuss plan of care with pts family, but dtr who is POA did not answer phone. Son defers to her. Pt may benefit from [facility] for objective assessment of swallowing. For now recommend honey thick liquids and dys 3 solids.     Aspiration Risk  Severe aspiration risk    Diet Recommendation Dysphagia 3 (Mech soft);Honey-thick liquid   Liquid Administration via: Cup Medication Administration: Whole meds with puree Supervision: Full supervision/cueing for compensatory strategies;Staff to assist with self feeding Compensations: Slow rate;Small sips/bites Postural Changes: Seated upright at 90 degrees;Remain upright for at least 30 minutes after po intake  Other  Recommendations Oral Care Recommendations: Oral care BID Other Recommendations: Order thickener from pharmacy;Have oral suction available   Follow up Recommendations  Skilled Nursing facility    Frequency and Duration min 2x/week  2 weeks       Prognosis Prognosis for Safe Diet Advancement: Guarded Barriers to Reach Goals: Severity of deficits      Swallow Study   General HPI: Patient is a 82 year old  male with history significant for AAA s/p repair in 2015, hypothyroidism, PMR on prednisone 1 mg/day, large left inguinal hernia and CKD who presents with closed left hip fx after a fall. The patient was in his normal state of health until this evening when he was walking in the bathroom, suddenly "just fell" and had immediate severe LEFT hip pain and could not walk. Underwent Left hip hemiarthroplasty on 12/05/15.  Type of Study: Bedside Swallow Evaluation Previous Swallow Assessment: none Diet Prior to this Study: Regular;Thin liquids Temperature Spikes Noted: No Respiratory Status: Nasal cannula History of Recent Intubation: Yes Length of Intubations (days):  (for surgery) Behavior/Cognition: Alert;Cooperative Oral Cavity Assessment: Dry Oral Care Completed by SLP: Yes Oral Cavity - Dentition: Missing dentition;Poor condition Self-Feeding Abilities: Total assist Patient Positioning: Upright in bed Baseline Vocal Quality: Low vocal intensity Volitional Cough: Strong Volitional Swallow: Able to elicit    Oral/Motor/Sensory Function Overall Oral Motor/Sensory Function: Within functional limits   Ice Chips Ice chips: Not tested   Thin Liquid Thin Liquid: Impaired Presentation: Cup;Straw Pharyngeal  Phase Impairments: Suspected delayed Swallow;Multiple swallows;Wet Vocal Quality;Cough - Immediate;Change in Vital Signs    Nectar Thick Nectar Thick Liquid: Impaired Presentation: Spoon;Cup Pharyngeal Phase Impairments: Suspected delayed Swallow;Cough - Immediate;Throat Clearing - Immediate;Wet Vocal Quality;Multiple swallows   Honey Thick Honey Thick Liquid: Not tested   Puree Puree: Within functional limits   Solid   GO   Solid: Within functional limits Presentation: Spoon Other Comments: extra time needed to [facility], MA CCC-SLP [phone]  Lynann Beaver 12/07/2015,8:57 AM

## 2015-12-07 NOTE — Evaluation (Signed)
Occupational Therapy Evaluation Patient Details Name: Corey Walker MRN: [id number] DOB: 1917/06/20 Today's Date: 12/07/2015    History of Present Illness Corey Walker is a 82 y.o. male with a past medical history significant for AAA s/p repair in 2015, hypothyroidism, PMR on prednisone 1 mg/day, large left inguinal hernia and CKD who presents with hip pain after a fall. Pt underwent L hip hemiarthroplasty 7/10.   Clinical Impression   Prior to admission, pt was ambulating throughout his ALF with a walker and able to perform self care independently. Pt presents with lethargy, minimally speaking or opening his eyes during evaluation. Required total assist for sit<>supine. Tolerated sitting EOB with max to min assist with posterior lean. Plan is for pt to go to rehab in SNF prior to return to ALF .Will defer further OT to SNF.   Follow Up Recommendations  SNF;Supervision/Assistance - 24 hour    Equipment Recommendations       Recommendations for Other Services       Precautions / Restrictions Precautions Precautions: Fall Restrictions Weight Bearing Restrictions: Yes LLE Weight Bearing: Weight bearing as tolerated      Mobility Bed Mobility Overal bed mobility: Needs Assistance Bed Mobility: Supine to Sit;Sit to Supine     Supine to sit: Total assist Sit to supine: Total assist   General bed mobility comments: used pad to pivot pt, total assist to pull up in bed with gravity assist  Transfers                 General transfer comment: did not attempt    Balance   Sitting-balance support: Feet supported Sitting balance-Leahy Scale: Poor Sitting balance - Comments: posterior lean, tolerated x 7 minutes                                    ADL                                         General ADL Comments: Pt currently requiring total assist for all ADL.     Vision Additional Comments: pt keeping eyes closed most of session   Perception     Praxis      Pertinent Vitals/Pain Pain Assessment: Faces Faces Pain Scale: Hurts little more Pain Location: L hip Pain Descriptors / Indicators: Grimacing;Guarding;Moaning Pain Intervention(s): Monitored during session;Premedicated before session;Repositioned;Ice applied;Limited activity within patient's tolerance     Hand Dominance Right   Extremity/Trunk Assessment Upper Extremity Assessment Upper Extremity Assessment: Generalized weakness   Lower Extremity Assessment Lower Extremity Assessment: Defer to PT evaluation   Cervical / Trunk Assessment Cervical / Trunk Assessment: Kyphotic   Communication Communication Communication: HOH   Cognition Arousal/Alertness: Lethargic;Suspect due to medications Behavior During Therapy: Flat affect Overall Cognitive Status: Difficult to assess (pt with baseline dementia per daughter)                     General Comments       Exercises       Shoulder Instructions      Home Living Family/patient expects to be discharged to:: Skilled nursing facility                                 Additional  Comments: pt lives in [facility] in [place]      Prior Functioning/Environment Level of Independence: Independent with assistive device(s)        Comments: pt was amb with RW into dining hall daily for all meals, pt reported doing all own bathing and dressing    OT Diagnosis: Generalized weakness;Cognitive deficits;Acute pain   OT Problem List:     OT Treatment/Interventions:      OT Goals(Current goals can be found in the care plan section) Acute Rehab OT Goals Patient Stated Goal: did not state  OT Frequency:     Barriers to D/C:            Co-evaluation              End of Session Equipment Utilized During Treatment: Oxygen  Activity Tolerance: Patient limited by fatigue;Patient limited by lethargy Patient left: in bed;with call bell/phone within reach;with bed  alarm set;with family/visitor present   Time: 1540-1609 OT Time Calculation (min): 29 min Charges:  OT General Charges $OT Visit: 1 Procedure OT Evaluation $OT Eval Moderate Complexity: 1 Procedure OT Treatments $Therapeutic Activity: 8-22 mins G-Codes:    Malka So 12/07/2015, 4:18 PM  [phone]

## 2015-12-08 ENCOUNTER — Inpatient Hospital Stay (HOSPITAL_COMMUNITY): Payer: Medicare Other

## 2015-12-08 LAB — CBC
HEMATOCRIT: 28.9 % — AB (ref 39.0–52.0)
HEMOGLOBIN: 9.4 g/dL — AB (ref 13.0–17.0)
MCH: 29.7 pg (ref 26.0–34.0)
MCHC: 32.5 g/dL (ref 30.0–36.0)
MCV: 91.5 fL (ref 78.0–100.0)
Platelets: 109 10*3/uL — ABNORMAL LOW (ref 150–400)
RBC: 3.16 MIL/uL — ABNORMAL LOW (ref 4.22–5.81)
RDW: 13.5 % (ref 11.5–15.5)
WBC: 6.1 10*3/uL (ref 4.0–10.5)

## 2015-12-08 LAB — BASIC METABOLIC PANEL
ANION GAP: 5 (ref 5–15)
BUN: 15 mg/dL (ref 6–20)
CHLORIDE: 118 mmol/L — AB (ref 101–111)
CO2: 18 mmol/L — AB (ref 22–32)
Calcium: 7.5 mg/dL — ABNORMAL LOW (ref 8.9–10.3)
Creatinine, Ser: 1.1 mg/dL (ref 0.61–1.24)
GFR calc non Af Amer: 55 mL/min — ABNORMAL LOW (ref >60)
Glucose, Bld: 94 mg/dL (ref 65–99)
Potassium: 3.4 mmol/L — ABNORMAL LOW (ref 3.5–5.1)
Sodium: 141 mmol/L (ref 135–145)

## 2015-12-08 MED ORDER — RESOURCE THICKENUP CLEAR PO POWD: ORAL | Status: AC

## 2015-12-08 NOTE — Clinical Social Work Note (Signed)
Patient to be discharged to [facility]. Patient's son-in-law and daughter updated regarding discharge. Patient to be transported via EMS.  RN report number: [place], [place] [facility]: [id number] Surgical: [phone]

## 2015-12-08 NOTE — Progress Notes (Signed)
Physical Therapy Treatment Patient Details Name: Corey Walker MRN: [id number] DOB: 1917/06/01 Today's Date: 12/08/2015    History of Present Illness Corey Walker is a 82 y.o. male with a past medical history significant for AAA s/p repair in 2015, hypothyroidism, PMR on prednisone 1 mg/day, large left inguinal hernia and CKD who presents with hip pain after a fall. Pt underwent L hip hemiarthroplasty 7/10.    PT Comments    Pt performed transfers with improved ease and less fatigue.  Decreased activity to squat pivot vs. Standing trial and patient able to tolerate activity better.    Follow Up Recommendations  SNF;Supervision/Assistance - 24 hour     Equipment Recommendations  None recommended by PT    Recommendations for Other Services       Precautions / Restrictions Precautions Precautions: Fall Restrictions Weight Bearing Restrictions: Yes LLE Weight Bearing: Weight bearing as tolerated    Mobility  Bed Mobility Overal bed mobility: Needs Assistance Bed Mobility: Supine to Sit;Rolling Rolling: Max assist   Supine to sit: Max assist;+2 for physical assistance     General bed mobility comments: Pt required rolling to R and L to perform perianal care.  Pt able to follow commands to reach for railing to improve ease and decrease care giver burden.    Transfers Overall transfer level: Needs assistance Equipment used:  (bed pad.) Transfers: Squat Pivot Transfers Sit to Stand: Max assist;+2 physical assistance         General transfer comment: Pt performed with smoother transition with use of bed pad.  Pt reports less pain with use of bed pad.    Ambulation/Gait Ambulation/Gait assistance:  (remains unable.  )               Stairs            Wheelchair Mobility    Modified Rankin (Stroke Patients Only)       Balance Overall balance assessment: History of Falls;Needs assistance   Sitting balance-Leahy Scale: Fair Sitting balance -  Comments: Pt presents with improved sitting balance.                              Cognition Arousal/Alertness: Awake/alert Behavior During Therapy: WFL for tasks assessed/performed Overall Cognitive Status: History of cognitive impairments - at baseline (per daughter.)                      Exercises      General Comments        Pertinent Vitals/Pain Pain Assessment: Faces Faces Pain Scale: Hurts whole lot Pain Location: L hip Pain Descriptors / Indicators: Grimacing;Guarding Pain Intervention(s): Monitored during session;Repositioned    Home Living                      Prior Function            PT Goals (current goals can now be found in the care plan section) Acute Rehab PT Goals Patient Stated Goal: did not state Potential to Achieve Goals: Poor Progress towards PT goals: Progressing toward goals    Frequency  Min 4X/week    PT Plan Current plan remains appropriate    Co-evaluation             End of Session Equipment Utilized During Treatment: Gait belt Activity Tolerance: Patient limited by fatigue;Patient limited by pain Patient left: with call bell/phone within reach;in chair  Time: [id number] PT Time Calculation (min) (ACUTE ONLY): 22 min  Charges:  $Therapeutic Activity: 8-22 mins                    G Codes:      Cristela Blue 12/22/15, 11:39 AM  Governor Rooks, PTA pager [phone]

## 2015-12-08 NOTE — Progress Notes (Addendum)
Called Gaspar Bidding to give report on pt spoke with [place]

## 2015-12-08 NOTE — Clinical Social Work Placement (Signed)
   CLINICAL SOCIAL WORK PLACEMENT  NOTE  Date:  12/08/2015  Patient Details  Name: Corey Walker MRN: [id number] Date of Birth: 17-Jan-1918  Clinical Social Work is seeking post-discharge placement for this patient at the [place] level of care (*CSW will initial, date and re-position this form in  chart as items are completed):  Yes   Patient/family provided with [facility] Work Department's list of facilities offering this level of care within the geographic area requested by the patient (or if unable, by the patient's family).  Yes   Patient/family informed of their freedom to choose among providers that offer the needed level of care, that participate in Medicare, Medicaid or managed care program needed by the patient, have an available bed and are willing to accept the patient.  Yes   Patient/family informed of [facility]'s ownership interest in [facility] and [facility], as well as of the fact that they are under no obligation to receive care at these facilities.  PASRR submitted to EDS on       PASRR number received on       Existing PASRR number confirmed on 12/06/15     FL2 transmitted to all facilities in geographic area requested by pt/family on 12/06/15     FL2 transmitted to all facilities within larger geographic area on       Patient informed that his/her managed care company has contracts with or will negotiate with certain facilities, including the following:        Yes   Patient/family informed of bed offers received.  Patient chooses bed at [facility]     Physician recommends and patient chooses bed at      Patient to be transferred to [facility] on 12/08/15.  Patient to be transferred to facility by [facility]     Patient family notified on 12/08/15 of transfer.  Name of family member notified:  Rush Landmark and Sharyn Lull (son-in-law and daughter)     PHYSICIAN Please sign FL2     Additional Comment:     _______________________________________________ Caroline Sauger, LCSW 12/08/2015, 11:13 AM

## 2015-12-08 NOTE — Progress Notes (Signed)
Patient was last in and out cath yesterday, still not voiding. Patient attempt to void only getting scant amount out. Bladder scan done showed ~889ml in bladder. MD on call paged and orders given to insert indwelling foley. Foley inserted at 2345 with out difficulties. Patient comfortable and is resting. Will continue to monitor.

## 2015-12-08 NOTE — Discharge Summary (Addendum)
Physician Discharge Summary  Corey Walker [id number] DOB: Nov 07, 1917 DOA: 12/04/2015  PCP: Kenn File, MD  Admit date: 12/04/2015 Discharge date: 12/08/2015  Time spent: 35 minutes  Recommendations for Outpatient Follow-up:  1. Follow up Orthopedics in 2 weeks 2. Voiding trial in next three days. 3. DVT prophylaxis with Lovenox per Orthopedics   Discharge Diagnoses:  Principal Problem:   Closed left hip fracture (HCC) Active Problems:   Essential hypertension   Hypothyroidism   AAA (abdominal aortic aneurysm) (HCC)   Unilateral inguinal hernia without obstruction   CKD (chronic kidney disease), stage III   Protein-calorie malnutrition, severe ([place])   Fall   Pressure ulcer   Discharge Condition: Stable  Diet recommendation: Diet recommendations: Dysphagia 3 (mechanical soft);Honey-thick liquid Liquids provided via: Teaspoon Medication Administration: Crushed with puree Supervision: Staff to assist with self feeding Compensations: Slow rate;Small sips/bites   History of present illness:  82 y.o. male with a past medical history significant for AAA s/p repair in 2015, hypothyroidism, PMR on prednisone 1 mg/day, large left inguinal hernia and CKD who presents with hip pain after a fall.  The patient was in his normal state of health until this evening when he was walking in the bathroom, suddenly "just fell" and had immediate severe LEFT hip pain and could not walk. There was no preceding weakness, chest discomfort, palpitations, or malaise, nor are there any of those symptoms now.  The patient has no active heart disease, angina, or history of MI. He walks with a four wheeled walker, three times a day to the cafeteria but is otherwise sedentary and cannot exert to an equivalent of 4 METS.   Hospital Course:   Left hip fracture- status post left hip hemiarthroplasty, postop day #2. Physical therapy recommended skilled nursing facility. Speech therapy evaluated and  started patient on dysphagia 3 diet. Patient to follow up Orthopedics in 2 weeks.  COPD- stable  Hypothyroidism-continue Synthroid  PMR-patient on tapering dose of prednisone, continue prednisone 1 mg by mouth daily to be completed on 12/22/2015  Chronic normocytic anemia- hemoglobin stable at 9.4.   Severe pain calorie malnutrition- continue Ensure  Hypertension- BP soft, will discontinue Lisinopril. Restart low dose Furosemide  Dementia- without behavior disturbance. Stable.  Urinary retention- Foley catheter inserted, renal ultrasound is negative for hydronephrosis. Will discharge with foley catheter. Voiding trial for next three days.  Procedures:  Left hip hemiarthroplasty  Consultations:  Orthopedics  Discharge Exam: Filed Vitals:   12/07/15 2100 12/08/15 0612  BP: 108/65 93/55  Pulse: 95 74  Temp: 99.2 F (37.3 C)   Resp:  19    General: Appears in no acute distress Cardiovascular: S1S2 RRR Respiratory: Clear bilaterally  Discharge Instructions   Discharge Instructions    Call MD for:  severe uncontrolled pain    Complete by:  As directed      Diet - low sodium heart healthy    Complete by:  As directed      Discharge instructions    Complete by:  As directed   Diet recommendations: Dysphagia 3 (mechanical soft);Honey-thick liquid Liquids provided via: Teaspoon Medication Administration: Crushed with puree Supervision: Staff to assist with self feeding Compensations: Slow rate;Small sips/bites     Increase activity slowly    Complete by:  As directed      Weight bearing as tolerated    Complete by:  As directed   Laterality:  left  Extremity:  Lower     Weight bearing as tolerated  Complete by:  As directed           Current Discharge Medication List    START taking these medications   Details  enoxaparin (LOVENOX) 30 MG/0.3ML injection Inject 0.3 mLs (30 mg total) into the skin daily. Qty: 14 Syringe, Refills: 1     HYDROcodone-acetaminophen (NORCO) 5-325 MG tablet Take 1-2 tablets by mouth every 6 (six) hours as needed for moderate pain. Qty: 60 tablet, Refills: 0    Maltodextrin-Xanthan Gum ([place]) POWD As needed with meals      CONTINUE these medications which have NOT CHANGED   Details  anti-nausea (EMETROL) solution Take 5 mLs by mouth every 15 (fifteen) minutes as needed for nausea or vomiting.    benzonatate (TESSALON) 100 MG capsule Take 100 mg by mouth 3 (three) times daily as needed for cough.    budesonide (PULMICORT) 180 MCG/ACT inhaler Inhale 1 puff into the lungs 2 (two) times daily.    cholecalciferol (VITAMIN D) 400 UNITS TABS Take 400 Units by mouth 2 (two) times daily.    cyanocobalamin (V-R VITAMIN B-12) 500 MCG tablet Take 1 tablet (500 mcg total) by mouth daily. Qty: 30 tablet, Refills: 11   Associated Diagnoses: B12 deficiency    docusate sodium (COLACE) 100 MG capsule Take 100 mg by mouth daily.    feeding supplement (ENSURE IMMUNE HEALTH) LIQD Take 237 mLs by mouth daily.     ferrous sulfate 325 (65 FE) MG tablet Take 2 tablets (650 mg total) by mouth daily with breakfast. Qty: 180 tablet, Refills: 2    fexofenadine (ALLEGRA) 180 MG tablet Take 1 tablet (180 mg total) by mouth daily. Qty: 30 tablet, Refills: 1    furosemide (LASIX) 20 MG tablet Take 20 mg by mouth every other day.     levothyroxine (SYNTHROID, LEVOTHROID) 75 MCG tablet Take 1 tablet (75 mcg total) by mouth daily. Qty: 90 tablet, Refills: 3         loperamide (IMODIUM) 2 MG capsule Take 2 mg by mouth as needed for diarrhea or loose stools.    Polyethyl Glycol-Propyl Glycol (SYSTANE) 0.4-0.3 % SOLN Apply 1 drop to eye 4 (four) times daily.    predniSONE (DELTASONE) 1 MG tablet Take 1 tablet (1 mg total) by mouth daily with breakfast. Qty: 30 tablet, Refills: 0    promethazine (PHENERGAN) 12.5 MG tablet Take 12.5 mg by mouth every 8 (eight) hours as needed for nausea or  vomiting.      STOP taking these medications     acetaminophen (TYLENOL) 500 MG tablet        Allergies  Allergen Reactions  . Loratadine Other (See Comments)    dizziness (PER MAR)  . Penicillins Swelling    Swelling of hands (per [facility] records)  . Darvocet [Propoxyphene N-Acetaminophen] Other (See Comments)    Propoxyphene (Per MAR)  . Fish Oil Other (See Comments)    unk  . Lactose Intolerance (Gi) Other (See Comments)    unk    Follow-up Information    Follow up with Mauri Pole, MD. Schedule an appointment as soon as possible for a visit in 2 weeks.   Specialty:  Orthopedic Surgery   Contact information:   [address] [place] [address] [phone]        The results of significant diagnostics from this hospitalization (including imaging, microbiology, ancillary and laboratory) are listed below for reference.    Significant Diagnostic Studies: Dg Chest 1 View  12/04/2015  CLINICAL DATA:  82 year old male with fall EXAM: CHEST 1 VIEW COMPARISON:  Chest radiograph dated 05/25/2014 FINDINGS: Single-view of the chest demonstrates emphysematous changes of the lungs. No focal consolidation, pleural effusion, or pneumothorax. The cardiac silhouette is within normal limits. The aorta is tortuous. There is osteopenia with degenerative changes of the spine. No acute fracture. IMPRESSION: No active disease. Electronically Signed   By: Anner Crete M.D.   On: 12/04/2015 23:31   Ct Head Wo Contrast  12/04/2015  CLINICAL DATA:  Unwitnessed fall.  Left hip pain. EXAM: CT HEAD WITHOUT CONTRAST CT CERVICAL SPINE WITHOUT CONTRAST TECHNIQUE: Multidetector CT imaging of the head and cervical spine was performed following the standard protocol without intravenous contrast. Multiplanar CT image reconstructions of the cervical spine were also generated. COMPARISON:  CT head 11/17/2008 FINDINGS: CT HEAD FINDINGS Diffuse cerebral atrophy. Low-attenuation changes  throughout the deep white matter consistent small vessel ischemia. Mild ventricular dilatation consistent with central atrophy. Small hyperdense extra-axial lesion along the left side of the upper falx measuring about 9 mm diameter. This is unchanged since previous study and probably represents a small calcified meningioma. No mass effect or midline shift. No abnormal extra-axial fluid collections. Gray-white matter junctions are distinct. Basal cisterns are not effaced. No evidence of acute intracranial hemorrhage. No depressed skull fractures. Visualized paranasal sinuses and mastoid air cells are not opacified. Vascular calcifications. CT CERVICAL SPINE FINDINGS Normal alignment of the cervical spine. Degenerative changes throughout the cervical spine with narrowed interspaces and associated endplate hypertrophic changes. No vertebral compression deformities. No prevertebral soft tissue swelling. C1-2 articulation appears intact. No focal bone lesion or bone destruction. Vascular calcifications. The upper chest demonstrates apical pleural thickening and scarring with mild bronchiectasis. Secretions within the trachea. Dilated aortic arch with diameter of about 3.7 cm, incompletely included within the field of view. IMPRESSION: No acute intracranial abnormalities. Chronic atrophy and small vessel ischemic changes. Small left para fall seen calcified meningioma. Normal alignment of the cervical spine with diffuse degenerative changes. No acute displaced fractures identified. Electronically Signed   By: Lucienne Capers M.D.   On: 12/04/2015 23:44   Ct Cervical Spine Wo Contrast  12/04/2015  CLINICAL DATA:  Unwitnessed fall.  Left hip pain. EXAM: CT HEAD WITHOUT CONTRAST CT CERVICAL SPINE WITHOUT CONTRAST TECHNIQUE: Multidetector CT imaging of the head and cervical spine was performed following the standard protocol without intravenous contrast. Multiplanar CT image reconstructions of the cervical spine were also  generated. COMPARISON:  CT head 11/17/2008 FINDINGS: CT HEAD FINDINGS Diffuse cerebral atrophy. Low-attenuation changes throughout the deep white matter consistent small vessel ischemia. Mild ventricular dilatation consistent with central atrophy. Small hyperdense extra-axial lesion along the left side of the upper falx measuring about 9 mm diameter. This is unchanged since previous study and probably represents a small calcified meningioma. No mass effect or midline shift. No abnormal extra-axial fluid collections. Gray-white matter junctions are distinct. Basal cisterns are not effaced. No evidence of acute intracranial hemorrhage. No depressed skull fractures. Visualized paranasal sinuses and mastoid air cells are not opacified. Vascular calcifications. CT CERVICAL SPINE FINDINGS Normal alignment of the cervical spine. Degenerative changes throughout the cervical spine with narrowed interspaces and associated endplate hypertrophic changes. No vertebral compression deformities. No prevertebral soft tissue swelling. C1-2 articulation appears intact. No focal bone lesion or bone destruction. Vascular calcifications. The upper chest demonstrates apical pleural thickening and scarring with mild bronchiectasis. Secretions within the trachea. Dilated aortic arch with diameter of about 3.7 cm, incompletely included  within the field of view. IMPRESSION: No acute intracranial abnormalities. Chronic atrophy and small vessel ischemic changes. Small left para fall seen calcified meningioma. Normal alignment of the cervical spine with diffuse degenerative changes. No acute displaced fractures identified. Electronically Signed   By: Lucienne Capers M.D.   On: 12/04/2015 23:44   US Renal  12/08/2015  CLINICAL DATA:  82 year old male with acute urinary retention. EXAM: RENAL / URINARY TRACT ULTRASOUND COMPLETE COMPARISON:  Abdominal CT dated 11/25/2014 and ultrasound dated 10/02/2011 FINDINGS: Right Kidney: Length: 8.5 cm.  There is moderate parenchymal atrophy with increased echotexture. No hydronephrosis or echogenic stone. Left Kidney: Length: 9 cm. There is moderate renal atrophy with increased echotexture. There is a 4.1 x 4.6 x 3.6 cm exophytic cyst arising from the posterior cortex of the left kidney. There is no hydronephrosis or echogenic stone. Bladder: The urinary bladder is decompressed around a Foley catheter and not visualized. IMPRESSION: No hydronephrosis or echogenic stone. Moderate bilateral renal atrophy. Left renal cyst. Electronically Signed   By: Anner Crete M.D.   On: 12/08/2015 01:32   Pelvis Portable  12/05/2015  CLINICAL DATA:  Status post left hip hemi arthroplasty. EXAM: PORTABLE PELVIS 1-2 VIEWS COMPARISON:  None. FINDINGS: There has been a left hemi arthroplasty with long stem femoral component. The prosthetic femoral head is well seated within the acetabulum. There is near anatomic alignment. Expected postsurgical soft tissue emphysema is seen. There is no evidence of osseous fractures. Aorto by iliac stent graft is seen. Soft tissues otherwise grossly normal. IMPRESSION: Status post left hemiarthroplasty without evidence of immediate complications. Electronically Signed   By: Fidela Salisbury M.D.   On: 12/05/2015 22:09   Dg Hand Complete Left  12/05/2015  CLINICAL DATA:  Ecchymosis and skin tears on the thumb after a fall. EXAM: LEFT HAND - COMPLETE 3+ VIEW COMPARISON:  None. FINDINGS: Degenerative changes in the interphalangeal joints, first carpometacarpal and metacarpal phalangeal joints, STT joints, and radiocarpal joints of the left hand and wrist. No evidence of acute fracture or dislocation. No soft tissue foreign bodies. IMPRESSION: Diffuse degenerative changes in the left hand and wrist. Electronically Signed   By: Lucienne Capers M.D.   On: 12/05/2015 00:32   Dg Hip Unilat With Pelvis 2-3 Views Left  12/04/2015  CLINICAL DATA:  82 year old male with fall and left hip pain.  EXAM: DG HIP (WITH OR WITHOUT PELVIS) 2-3V LEFT COMPARISON:  CT of the abdomen pelvis dated 11/25/2014 FINDINGS: There is a mildly displaced transverse fracture of the left femoral neck. There is mild proximal migration and impaction of the femoral shaft. The head of the femur remains in anatomic alignment within the acetabulum. No other acute fracture identified. The bones are osteopenic. An aorta bi-iliac endovascular stent graft is partially visualized. Postsurgical changes of bowel resection with anastomotic suture in the right lower quadrant. Air within the urinary bladder likely introduced via catheterization. IMPRESSION: Mildly impacted fracture of the left femoral neck. Electronically Signed   By: Anner Crete M.D.   On: 12/04/2015 23:33   Dg Femur 1v Left  12/04/2015  CLINICAL DATA:  Left hip pain after fall in the shower. EXAM: LEFT FEMUR 1 VIEW COMPARISON:  None. FINDINGS: Views obtained including the mid and distal shaft of the left femur demonstrate no acute fracture or dislocation in the visualized bones. Degenerative changes in the left knee. Vascular calcifications. IMPRESSION: No fractures demonstrated in the visualized midshaft and distal left femur. Electronically Signed   By: Gwyndolyn Saxon  Gerilyn Nestle M.D.   On: 12/04/2015 23:30    Microbiology: Recent Results (from the past 240 hour(s))  Surgical PCR screen     Status: None   Collection Time: 12/05/15  2:02 AM  Result Value Ref Range Status   MRSA, PCR NEGATIVE NEGATIVE Final   Staphylococcus aureus NEGATIVE NEGATIVE Final    Comment:        The Xpert SA Assay (FDA approved for NASAL specimens in patients over 53 years of age), is one component of a comprehensive surveillance program.  Test performance has been validated by [facility] for patients greater than or equal to 21 year old. It is not intended to diagnose infection nor to guide or monitor treatment.   Urine culture     Status: None   Collection Time: 12/05/15   4:59 PM  Result Value Ref Range Status   Specimen Description URINE, CATHETERIZED  Final   Special Requests NONE  Final   Culture NO GROWTH  Final   Report Status 12/06/2015 FINAL  Final     Labs: Basic Metabolic Panel:  Recent Labs Lab 12/04/15 2215 12/05/15 0146 12/05/15 2243 12/07/15 0258 12/08/15 0752  NA 139 138  --  139 141  K 4.0 4.0  --  3.7 3.4*  CL 113* 117*  --  116* 118*  CO2 19* 15*  --  19* 18*  GLUCOSE 117* 117*  --  112* 94  BUN 27* 25*  --  16 15  CREATININE 1.31* 1.26* 1.03 1.16 1.10  CALCIUM 9.0 8.5*  --  7.8* 7.5*   Liver Function Tests: No results for input(s): AST, ALT, ALKPHOS, BILITOT, PROT, ALBUMIN in the last 168 hours. No results for input(s): LIPASE, AMYLASE in the last 168 hours. No results for input(s): AMMONIA in the last 168 hours. CBC:  Recent Labs Lab 12/04/15 2215 12/05/15 0146 12/05/15 2243 12/06/15 0258 12/07/15 0258 12/08/15 0752  WBC 9.2 8.5 8.0 7.2 8.6 6.1  NEUTROABS 7.0  --   --   --   --   --   HGB 12.2* 11.7* 11.1* 9.3* 9.9* 9.4*  HCT 36.8* 34.9* 33.4* 28.6* 30.1* 28.9*  MCV 90.2 90.4 91.5 92.3 91.2 91.5  PLT 149* 132* 112* 99* 111* 109*   Cardiac Enzymes:  Recent Labs Lab 12/05/15 1514  TROPONINI 0.05*    CBG:  Recent Labs Lab 12/07/15 1204 12/07/15 1645  GLUCAP 93 100*       Signed:  Akshaya Toepfer S MD.  [facility] 12/08/2015, 10:36 AM

## 2015-12-08 NOTE — Progress Notes (Signed)
Speech Language Pathology Treatment: Dysphagia  Patient Details Name: Corey Walker MRN: [id number] DOB: [birth year] Today's Date: 12/08/2015 Time: 0810-0820 SLP Time Calculation (min) (ACUTE ONLY): 10 min  Assessment / Plan / Recommendation Clinical Impression  Pt seen after am meal, which he did not eat very much of, though he reports this is his habit. Pt now on Dys1 (puree) though mechanical soft recommended yesterday. Pt continues to demonstrate significant dysphagia with liquids, he tolerated honey thick liquids as well as possible with delayed swallow and mild immediate throat clearing, though this is much improved from the significant overt aspiration with thin and nectar seen yesterday. Recommend pt consume mechanical soft diet, which will give him more pleasure with meals, though honey thick liquids are still recommended. If pt makes functional gains in next level of care in the areas of strength and mobility he may benefit from objective testing in the future if needed.    HPI HPI: Patient is a 82 year old male with history significant for AAA s/p repair in 2015, hypothyroidism, PMR on prednisone 1 mg/day, large left inguinal hernia and CKD who presents with closed left hip fx after a fall. The patient was in his normal state of health until this evening when he was walking in the bathroom, suddenly "just fell" and had immediate severe LEFT hip pain and could not walk. Underwent Left hip hemiarthroplasty on 12/05/15.       SLP Plan  Continue with current plan of care     Recommendations  Diet recommendations: Dysphagia 3 (mechanical soft);Honey-thick liquid Liquids provided via: Teaspoon Medication Administration: Crushed with puree Supervision: Staff to assist with self feeding Compensations: Slow rate;Small sips/bites             Oral Care Recommendations: Oral care BID Follow up Recommendations: Skilled Nursing facility Plan: Continue with current plan of care      Corey Joy Delphina Schum, MA CCC-SLP [id number]  Lynann Beaver 12/08/2015, 8:50 AM

## 2016-01-25 ENCOUNTER — Encounter: Payer: Self-pay | Admitting: Cardiology

## 2016-01-27 DEATH — deceased

## 2016-06-18 IMAGING — CT CT CTA ABD/PEL W/CM AND/OR W/O CM
3 of 10 series · 10 of 36 positions shown, 15 images · IV contrast (75CC OMNI 350)
Comparison: CT scan of April 11, 2014.

CLINICAL DATA: Status post endovascular stent graft repair for
abdominal aortic aneurysm.

EXAM:
CTA ABDOMEN AND PELVIS wITHOUT AND WITH CONTRAST
TECHNIQUE: Multidetector CT imaging of the abdomen and pelvis was performed
using the standard protocol during bolus administration of
intravenous contrast. Multiplanar reconstructed images and MIPs were
obtained and reviewed to evaluate the vascular anatomy.
CONTRAST:  75mL OMNIPAQUE IOHEXOL 350 MG/ML SOLN

[Series 5: angio 2.5 · axial · 0.70mm/px · z∈[-318,-78]mm · 4 of 162 slices shown, 9 images]
[im 33/162  soft-tissue]
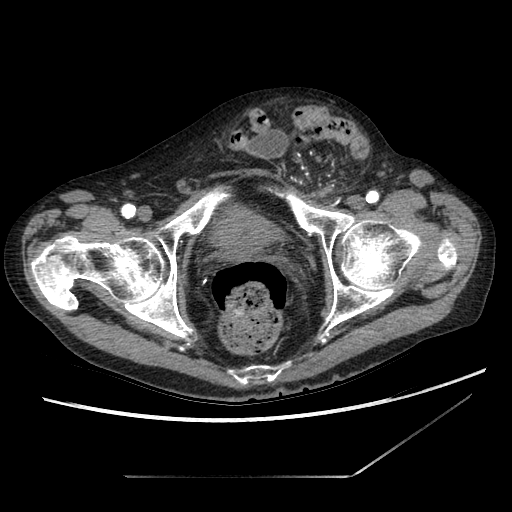
[im 33/162  lung]
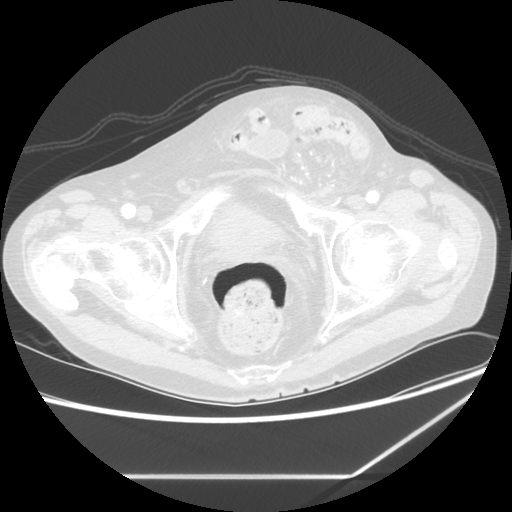
[im 33/162  bone]
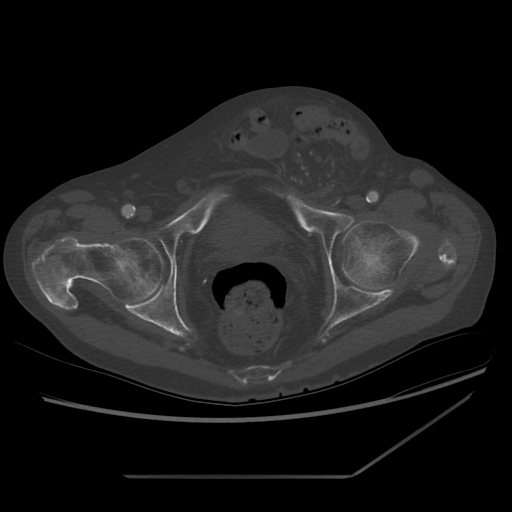
[im 65/162  soft-tissue]
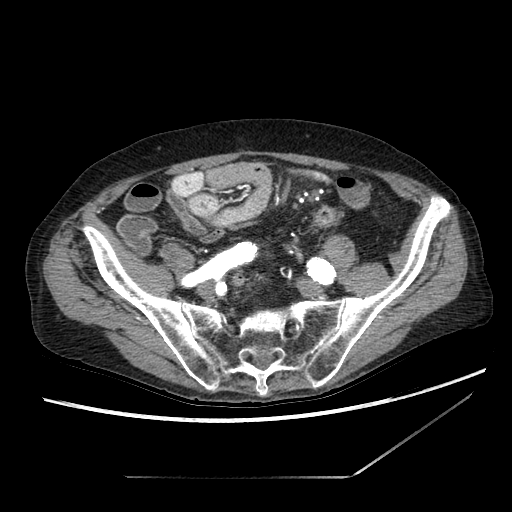
[im 65/162  lung]
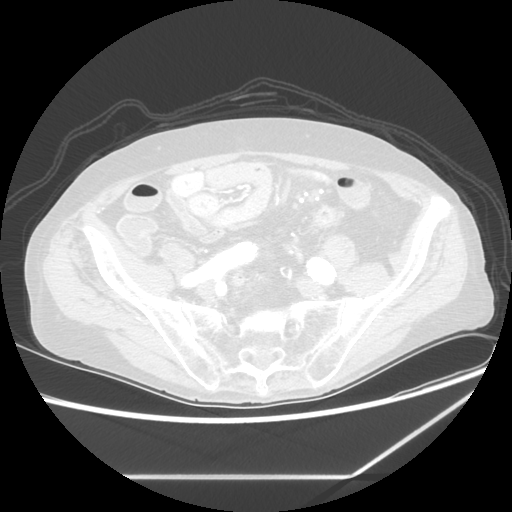
[im 97/162  soft-tissue]
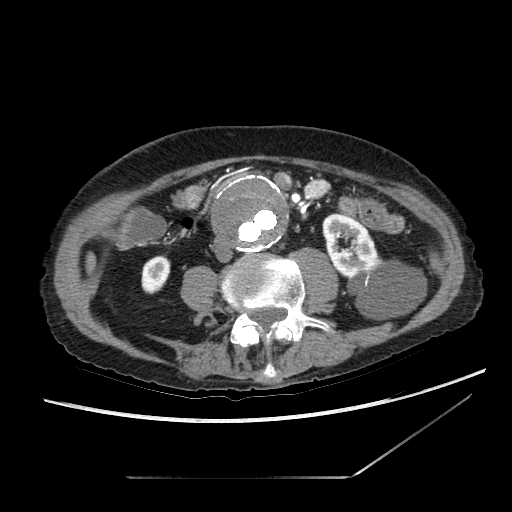
[im 97/162  lung]
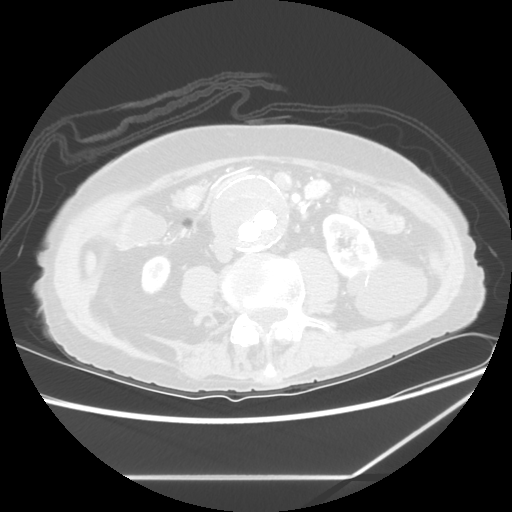
[im 129/162  soft-tissue]
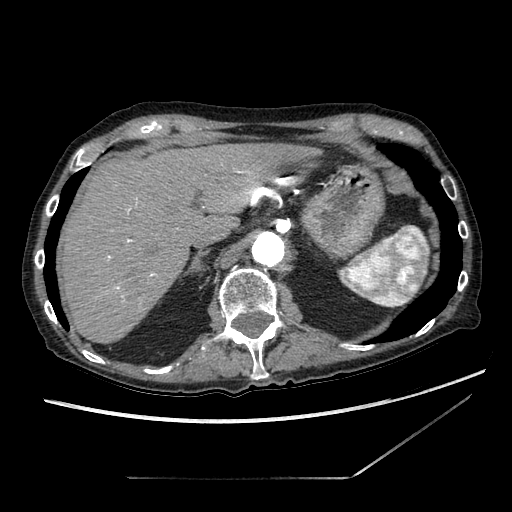
[im 129/162  lung]
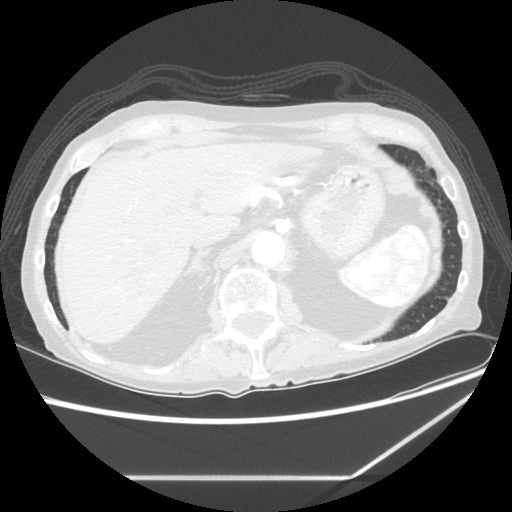

[Series 602: sagittal body · sagittal · 0.79mm/px · 3 of 142 slices shown (1 of 2)]
[im 36/142  soft-tissue]
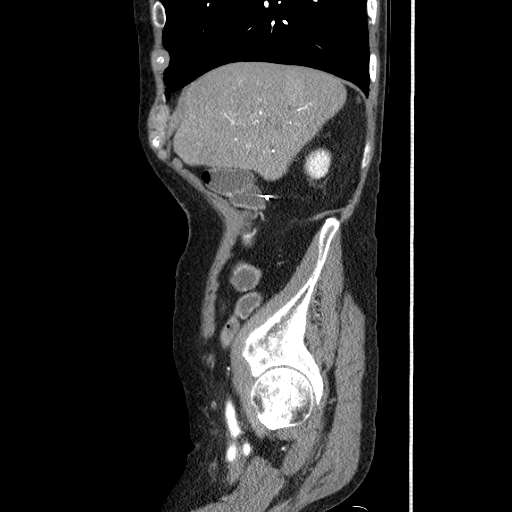
[im 71/142  soft-tissue]
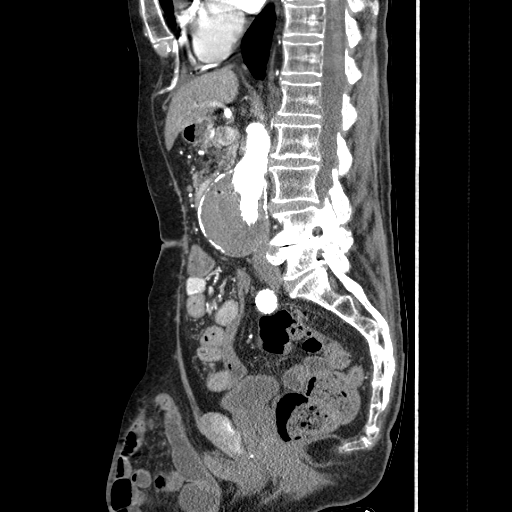
[im 106/142  soft-tissue]
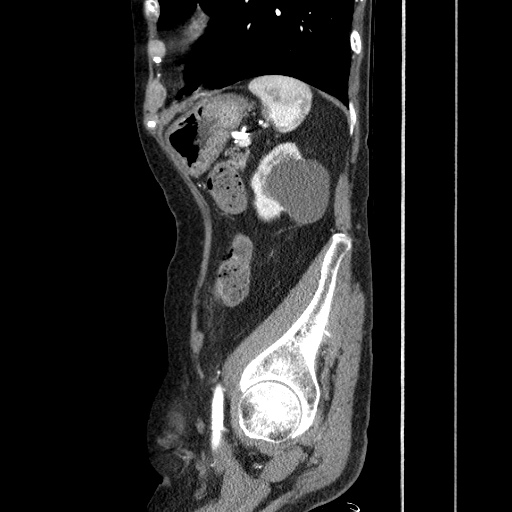

[Series 607: sagittal body · sagittal · 0.70mm/px · 3 of 135 slices shown (2 of 2)]
[im 34/135  soft-tissue]
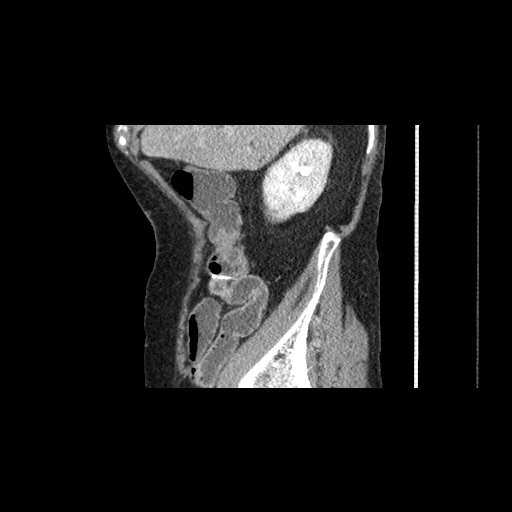
[im 68/135  soft-tissue]
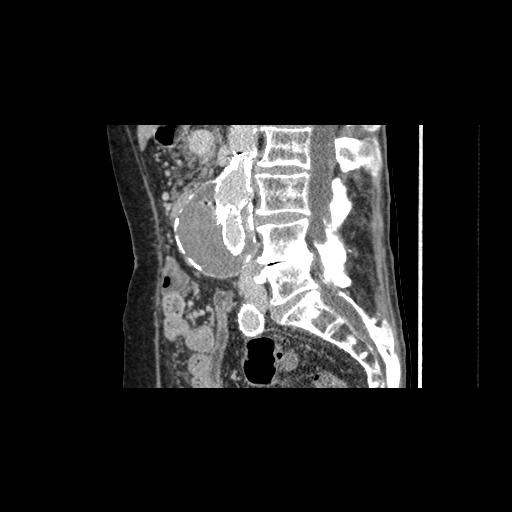
[im 101/135  soft-tissue]
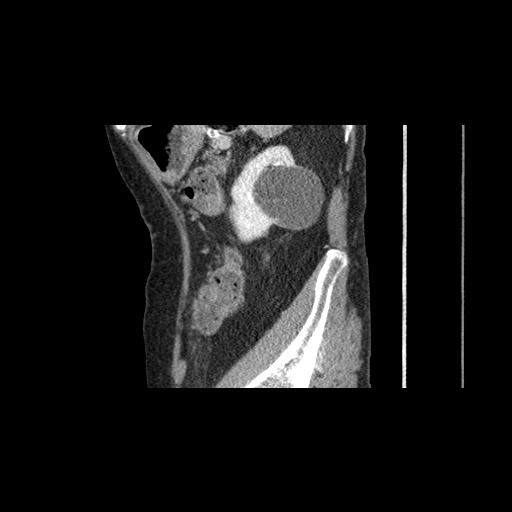

[10 of 36 positions shown; findings below may reference images not displayed]

FINDINGS: Severe degenerative disc disease is noted at L4-5. Moderate
degenerative disc disease is noted at L2-3 and L5-S1. Visualized
lung bases appear normal.

Status post stent graft repair of infrarenal abdominal aortic
aneurysm. The graft and its limbs appear to be widely patent. The
common and external iliac arteries are widely patent without
significant stenosis. Small type 2 endoleak 10 x 5 mm is noted
posteriorly in the aneurysmal sac secondary to retrograde flow
through patent lumbar branch. Severe stenosis is noted at the origin
of the celiac artery with poststenotic dilatation which is stable
compared to prior exam. Superior mesenteric and renal arteries are
widely patent without significant stenosis. The excluded aneurysmal
sac measures 5.4 cm in maximum AP diameter and 5.2 cm in maximum
transverse diameter.

The liver, spleen and pancreas appear normal. Adrenal glands appear
normal. Stable large simple cyst is seen arising from left kidney.
No hydronephrosis or renal obstruction is noted. No abnormal fluid
collection is noted. Sigmoid diverticulosis is noted without
inflammation. Continued presence of large left inguinal hernia which
extends into the left scrotum ; it contains loops of small bowel,
but does not result in significant obstruction.

Review of the MIP images confirms the above findings.
IMPRESSION: Status post endograft repair of abdominal aortic aneurysm. There is
noted a 10 x 5 mm type 2 endoleak posteriorly in the aneurysmal sac
just above the aortic bifurcation, secondary to retrograde flow from
patent lumbar vessels.

Stable large left inguinal hernia which extends into left-sided
scrotum. It contains multiple loops of small bowel, but does not
result in significant obstruction.
